# Patient Record
Sex: Male | Born: 1955 | Race: White | Hispanic: No | Marital: Married | Smoking: Never smoker
Health system: Southern US, Community
[De-identification: ages and names within clinical notes are randomized; demographics above are authoritative.]

## PROBLEM LIST (undated history)

## (undated) DIAGNOSIS — E119 Type 2 diabetes mellitus without complications: Secondary | ICD-10-CM

## (undated) DIAGNOSIS — M25561 Pain in right knee: Secondary | ICD-10-CM

## (undated) DIAGNOSIS — I639 Cerebral infarction, unspecified: Secondary | ICD-10-CM

## (undated) DIAGNOSIS — E785 Hyperlipidemia, unspecified: Secondary | ICD-10-CM

## (undated) DIAGNOSIS — M7660 Achilles tendinitis, unspecified leg: Secondary | ICD-10-CM

## (undated) DIAGNOSIS — R972 Elevated prostate specific antigen [PSA]: Secondary | ICD-10-CM

## (undated) DIAGNOSIS — M25511 Pain in right shoulder: Secondary | ICD-10-CM

## (undated) DIAGNOSIS — I10 Essential (primary) hypertension: Secondary | ICD-10-CM

## (undated) HISTORY — PX: TONSILLECTOMY: SUR1361

---

## 2011-11-22 ENCOUNTER — Telehealth: Payer: Self-pay

## 2011-11-22 NOTE — Telephone Encounter (Signed)
Spoke with pt on 11/21/2011. He said he wants to wait til a little later in the spring to have the colonoscopy and he will call when he is ready. Sending a letter to PCP.

## 2012-05-08 ENCOUNTER — Telehealth: Payer: Self-pay

## 2012-05-08 NOTE — Telephone Encounter (Signed)
LMOM to call.

## 2012-06-04 NOTE — Telephone Encounter (Signed)
Called pt. He is busy with a new job. He does want to get a colonoscopy, but will call when ready. Letter sent to PCP also.

## 2020-10-27 ENCOUNTER — Encounter: Payer: Self-pay | Admitting: Emergency Medicine

## 2020-10-27 ENCOUNTER — Ambulatory Visit
Admission: EM | Admit: 2020-10-27 | Discharge: 2020-10-27 | Disposition: A | Discharge status: Home / Self Care | Payer: Medicare Other | Attending: Family Medicine | Admitting: Family Medicine

## 2020-10-27 ENCOUNTER — Other Ambulatory Visit: Payer: Self-pay

## 2020-10-27 DIAGNOSIS — R030 Elevated blood-pressure reading, without diagnosis of hypertension: Secondary | ICD-10-CM

## 2020-10-27 DIAGNOSIS — R059 Cough, unspecified: Secondary | ICD-10-CM

## 2020-10-27 MED ORDER — BENZONATATE 100 MG PO CAPS: ORAL_CAPSULE | ORAL | 0 refills | Status: DC

## 2020-10-27 MED ORDER — AZITHROMYCIN 250 MG PO TABS: 250.0000 mg | ORAL_TABLET | Freq: Every day | ORAL | 0 refills | Status: DC

## 2020-10-27 NOTE — Discharge Instructions (Signed)
Your blood pressure was noted to be elevated during your visit today. If you are currently taking medication for high blood pressure, please ensure you are taking this as directed. If you do not have a history of high blood pressure and your blood pressure remains persistently elevated, you may need to begin taking a medication at some point. You may return here within the next few days to recheck if unable to see your primary care provider or if you do not have a one.  BP (!) 190/115 (BP Location: Right Arm)   Pulse (!) 111   Temp 98.9 F (37.2 C) (Oral)   Resp 18   Ht 6\' 4"  (1.93 m)   Wt 104.3 kg   SpO2 94%   BMI 28.00 kg/m   BP Readings from Last 3 Encounters:  10/27/20 (!) 190/115

## 2020-10-27 NOTE — ED Triage Notes (Signed)
Sinus congestion, pressure and cough that started yesterday.  Son was sick with same s/s recently.

## 2020-10-27 NOTE — ED Provider Notes (Signed)
[facility]   [id number] 10/27/20 Arrival Time: 0803  ASSESSMENT & PLAN:  1. Cough   2. Elevated blood pressure reading without diagnosis of hypertension    Stop any OTC medications with Sudafed. Likely causing increased BP. COVID testing sent.  Begin: Meds ordered this encounter  Medications  . azithromycin (ZITHROMAX) 250 MG tablet    Sig: Take 1 tablet (250 mg total) by mouth daily. Take first 2 tablets together, then 1 every day until finished.    Dispense:  6 tablet    Refill:  0  . benzonatate (TESSALON) 100 MG capsule    Sig: Take 1 capsule by mouth every 8 (eight) hours for cough.    Dispense:  21 capsule    Refill:  0     Follow-up Information    [facility] [employer] [facility].   Specialty: Family Medicine Why: If worsening or failing to improve as anticipated. Contact information: [address] [place] [address] [phone]              Recommend rechecking BP here or with PCP within the next 48-72 hours.  Reviewed expectations re: course of current medical issues. Questions answered. Outlined signs and symptoms indicating need for more acute intervention. Understanding verbalized. After Visit Summary given.   SUBJECTIVE: History from: patient. Howard Johnson is a 65 y.o. male who reports sinus congestion, pressure; thinks over past week after mowing lawn; now with significant coughing. No fever reported. No SOB or CP. Denies: sore throat and headache. Normal PO intake without n/v/d. OTC cold medicine this am; mild help.  Increased blood pressure noted today. Reports that he has not been treated for hypertension in the past. He reports no swelling of ankles, no orthostatic dizziness or lightheadedness, no orthopnea or paroxysmal nocturnal dyspnea and no palpitations.   OBJECTIVE:  Vitals:   10/27/20 0817 10/27/20 0819  BP:  (!) 190/115  Pulse:  (!) 111  Resp:  18  Temp:  98.9 F (37.2 C)  TempSrc:  Oral  SpO2:  94%   Weight: 104.3 kg   Height: 6\' 4"  (1.93 m)     General appearance: alert; no distress; appears fatigued Eyes: PERRLA; EOMI; conjunctiva normal HENT: [place]; AT; with nasal congestion Neck: supple  CV: RRR; recheck pulse 98 Lungs: speaks full sentences without difficulty; unlabored; CTAB Extremities: no edema Skin: warm and dry Neurologic: normal gait Psychological: alert and cooperative; normal mood and affect  Labs:  Labs Reviewed  NOVEL CORONAVIRUS, NAA    No Known Allergies  History reviewed. No pertinent past medical history. Social History   Socioeconomic History  . Marital status: Married    Spouse name: Not on file  . Number of children: Not on file  . Years of education: Not on file  . Highest education level: Not on file  Occupational History  . Not on file  Tobacco Use  . Smoking status: Never Smoker  . Smokeless tobacco: Never Used  Substance and Sexual Activity  . Alcohol use: Never  . Drug use: Never  . Sexual activity: Not on file  Other Topics Concern  . Not on file  Social History Narrative  . Not on file   Social Determinants of Health   Financial Resource Strain: Not on file  Food Insecurity: Not on file  Transportation Needs: Not on file  Physical Activity: Not on file  Stress: Not on file  Social Connections: Not on file  Intimate Partner Violence: Not on file  No family history on file. Past Surgical History:  Procedure Laterality Date  . Greig Right, MD 10/27/20 [phone]

## 2020-10-28 LAB — NOVEL CORONAVIRUS, NAA: SARS-CoV-2, NAA: NOT DETECTED

## 2020-10-28 LAB — SARS-COV-2, NAA 2 DAY TAT

## 2020-11-23 ENCOUNTER — Ambulatory Visit
Admission: EM | Admit: 2020-11-23 | Discharge: 2020-11-23 | Disposition: A | Discharge status: Home / Self Care | Payer: Medicare Other | Attending: Internal Medicine | Admitting: Internal Medicine

## 2020-11-23 ENCOUNTER — Encounter: Payer: Self-pay | Admitting: Emergency Medicine

## 2020-11-23 ENCOUNTER — Other Ambulatory Visit: Payer: Self-pay

## 2020-11-23 DIAGNOSIS — Z7984 Long term (current) use of oral hypoglycemic drugs: Secondary | ICD-10-CM | POA: Diagnosis not present

## 2020-11-23 DIAGNOSIS — E1165 Type 2 diabetes mellitus with hyperglycemia: Secondary | ICD-10-CM | POA: Diagnosis not present

## 2020-11-23 DIAGNOSIS — I10 Essential (primary) hypertension: Secondary | ICD-10-CM | POA: Diagnosis not present

## 2020-11-23 LAB — POCT FASTING CBG KUC MANUAL ENTRY: POCT Glucose (KUC): 314 mg/dL — AB (ref 70–99)

## 2020-11-23 MED ORDER — METFORMIN HCL 500 MG PO TABS: 500.0000 mg | ORAL_TABLET | Freq: Two times a day (BID) | ORAL | 0 refills | Status: DC

## 2020-11-23 MED ORDER — LISINOPRIL 5 MG PO TABS: 5.0000 mg | ORAL_TABLET | Freq: Every day | ORAL | 0 refills | Status: DC

## 2020-11-23 NOTE — ED Triage Notes (Signed)
States his BP and BS is high.  States blood sugar is staying around 270s to 300.

## 2020-11-23 NOTE — ED Provider Notes (Signed)
[facility]    CSN: [id number] Arrival date & time: 11/23/20  1507      History   Chief Complaint No chief complaint on file.   HPI Howard Johnson is a 65 y.o. male who presents with elevated glucose since he noticed polydipsia and decreased appetite and had been drinking a lot juices after having a cold 3 weeks. 5 days ago his glucose was 300 and has cut down on carbs and less food but his glucose are still not better. Was DM type 2 at age 70 years and started with Metforming and after changing his diet and lost wt his meds were d/c within less than one year. Had viral URI 3 weeks ago, did not do a covid test. Did not have a fever.  He tried to see his PCP today, but they wont see him since he has not been there in 3 years.    History reviewed. No pertinent past medical history.  There are no problems to display for this patient.   Past Surgical History:  Procedure Laterality Date  . TONSILLECTOMY      Home Medications    Prior to Admission medications   Medication Sig Start Date End Date Taking? Authorizing Provider  lisinopril (ZESTRIL) 5 MG tablet Take 1 tablet (5 mg total) by mouth daily. 11/23/20  Yes Rodriguez-Southworth, Nettie Elm, PA-C  metFORMIN (GLUCOPHAGE) 500 MG tablet Take 1 tablet (500 mg total) by mouth 2 (two) times daily with a meal. 11/23/20  Yes Rodriguez-Southworth, Nettie Elm, PA-C    Family History History reviewed. No pertinent family history.  Social History Social History   Tobacco Use  . Smoking status: Never Smoker  . Smokeless tobacco: Never Used  Substance Use Topics  . Alcohol use: Never  . Drug use: Never     Allergies   Patient has no known allergies.   Review of Systems Review of Systems  Review of Systems  Constitutional: Negative for diaphoresis and unexpected weight change.  HENT: Negative for tinnitus.   Eyes: Negative for visual disturbance.  Respiratory: Negative for chest tightness and shortness of breath.    Cardiovascular: Negative for chest pain, palpitations and leg swelling.  Gastrointestinal: Negative for constipation, diarrhea and nausea.  Endocrine: Negative for polydipsia, polyphagia and polyuria.  Genitourinary: Negative for dysuria and frequency.  Skin: Negative for rash and wound.  Neurological: Negative for dizziness, speech difficulty, weakness, numbness and headaches.      Physical Exam Triage Vital Signs ED Triage Vitals  Enc Vitals Group     BP 11/23/20 1515 (!) 149/104     Pulse Rate 11/23/20 1515 (!) 108     Resp 11/23/20 1515 18     Temp 11/23/20 1515 98 F (36.7 C)     Temp Source 11/23/20 1515 Oral     SpO2 11/23/20 1515 97 %     Weight --      Height --      Head Circumference --      Peak Flow --      Pain Score 11/23/20 1517 0     Pain Loc --      Pain Edu? --      Excl. in GC? --    No data found.  Updated Vital Signs BP (!) 149/104 (BP Location: Right Arm)   Pulse (!) 108   Temp 98 F (36.7 C) (Oral)   Resp 18   SpO2 97%   Visual Acuity Right Eye Distance:   Left Eye  Distance:   Bilateral Distance:    Right Eye Near:   Left Eye Near:    Bilateral Near:     Physical Exam  Constitutional: he is oriented to person, place, and time. he appears well-developed and well-nourished. No distress.  HENT:  Head: Normocephalic and atraumatic.  Right Ear: External ear normal.  Left Ear: External ear normal.  Nose: Nose normal.  Eyes: Conjunctivae are normal. Right eye exhibits no discharge. Left eye exhibits no discharge. No scleral icterus.  Neck: Neck supple. No thyromegaly present.  No carotid bruits bilaterally  Cardiovascular: Normal rate and regular rhythm.  No murmur heard. Pulmonary/Chest: Effort normal and breath sounds normal. No respiratory distress.  Musculoskeletal: Normal range of motion. he exhibits no edema.  Lymphadenopathy: he has no cervical adenopathy.  Neurological: he is alert and oriented to person, place, and time.   Skin: Skin is warm and dry. Capillary refill takes less than 2 seconds. No rash noted. he is not diaphoretic.  Psychiatric: he has a normal mood and affect. His behavior is normal. Judgment and thought content normal.  Nursing note reviewed.   UC Treatments / Results  Labs (all labs ordered are listed, but only abnormal results are displayed) Labs Reviewed  POCT FASTING CBG [facility] MANUAL ENTRY - Abnormal; Notable for the following components:      Result Value   POCT Glucose ([facility]) 314 (*)    All other components within normal limits  CBC  COMPREHENSIVE METABOLIC PANEL  HEMOGLOBIN A1C    EKG   Radiology No results found.  Procedures Procedures (including critical care time)  Medications Ordered in UC Medications - No data to display  Initial Impression / Assessment and Plan / UC Course  I have reviewed the triage vital signs and the nursing notes. Pertinent labs  results that were available during my care of the patient were reviewed by me and considered in my medical decision making (see chart for details). I ordered CBC, CMP and HGBA1C and I will call him tomorrow with results.  DM type 2 and HTN recurrent. I placed him on Metformin and Lisinopril as noted.  Final Clinical Impressions(s) / UC Diagnoses   Final diagnoses:  Uncontrolled type 2 diabetes mellitus with hyperglycemia (HCC)  Essential hypertension     Discharge Instructions     Please write down your fasting and 2 hours after eating glucose and your blood pressure readings mid morning( 2-3 hours after taking your medication) to take to your primary care doctor in 2 weeks.  I will call you tomorrow with your lab work results.     ED Prescriptions    Medication Sig Dispense Auth. Provider   metFORMIN (GLUCOPHAGE) 500 MG tablet Take 1 tablet (500 mg total) by mouth 2 (two) times daily with a meal. 60 tablet Rodriguez-Southworth, Yumna Ebers, PA-C   lisinopril (ZESTRIL) 5 MG tablet Take 1 tablet (5 mg total) by  mouth daily. 30 tablet Rodriguez-Southworth, Nettie Elm, PA-C     PDMP not reviewed this encounter.   Garey Ham, Cordelia Poche 11/23/20 1902

## 2020-11-23 NOTE — Discharge Instructions (Signed)
Please write down your fasting and 2 hours after eating glucose and your blood pressure readings mid morning( 2-3 hours after taking your medication) to take to your primary care doctor in 2 weeks.  I will call you tomorrow with your lab work results.

## 2020-11-24 ENCOUNTER — Telehealth: Payer: Self-pay | Admitting: Internal Medicine

## 2020-11-24 LAB — COMPREHENSIVE METABOLIC PANEL
ALT: 39 IU/L (ref 0–44)
AST: 25 IU/L (ref 0–40)
Albumin/Globulin Ratio: 2.2 (ref 1.2–2.2)
Albumin: 4.8 g/dL (ref 3.8–4.8)
Alkaline Phosphatase: 122 IU/L — ABNORMAL HIGH (ref 44–121)
BUN/Creatinine Ratio: 15 (ref 10–24)
BUN: 21 mg/dL (ref 8–27)
Bilirubin Total: 0.7 mg/dL (ref 0.0–1.2)
CO2: 18 mmol/L — ABNORMAL LOW (ref 20–29)
Calcium: 10.2 mg/dL (ref 8.6–10.2)
Chloride: 98 mmol/L (ref 96–106)
Creatinine, Ser: 1.4 mg/dL — ABNORMAL HIGH (ref 0.76–1.27)
Globulin, Total: 2.2 g/dL (ref 1.5–4.5)
Glucose: 343 mg/dL — ABNORMAL HIGH (ref 65–99)
Potassium: 3.8 mmol/L (ref 3.5–5.2)
Sodium: 139 mmol/L (ref 134–144)
Total Protein: 7 g/dL (ref 6.0–8.5)
eGFR: 55 mL/min/{1.73_m2} — ABNORMAL LOW (ref >59)

## 2020-11-24 LAB — CBC
Hematocrit: 50 % (ref 37.5–51.0)
Hemoglobin: 16.6 g/dL (ref 13.0–17.7)
MCH: 27.9 pg (ref 26.6–33.0)
MCHC: 33.2 g/dL (ref 31.5–35.7)
MCV: 84 fL (ref 79–97)
Platelets: 264 10*3/uL (ref 150–450)
RBC: 5.96 x10E6/uL — ABNORMAL HIGH (ref 4.14–5.80)
RDW: 13.5 % (ref 11.6–15.4)
WBC: 9 10*3/uL (ref 3.4–10.8)

## 2020-11-24 LAB — HEMOGLOBIN A1C
Est. average glucose Bld gHb Est-mCnc: 321 mg/dL
Hgb A1c MFr Bld: 12.8 % — ABNORMAL HIGH (ref 4.8–5.6)

## 2020-11-24 NOTE — Telephone Encounter (Signed)
I spoke with pt about all his lab results and he told me that his glucose this am after taking the am metformin was in the 200's and this pm was 180 post prandial. Has summitted an application to get established with a new PCP and they told him they would contact him within a week.

## 2020-12-12 DIAGNOSIS — I10 Essential (primary) hypertension: Secondary | ICD-10-CM | POA: Diagnosis not present

## 2020-12-12 DIAGNOSIS — E1165 Type 2 diabetes mellitus with hyperglycemia: Secondary | ICD-10-CM | POA: Diagnosis not present

## 2020-12-12 DIAGNOSIS — Z0189 Encounter for other specified special examinations: Secondary | ICD-10-CM | POA: Diagnosis not present

## 2020-12-12 DIAGNOSIS — N182 Chronic kidney disease, stage 2 (mild): Secondary | ICD-10-CM | POA: Diagnosis not present

## 2020-12-29 DIAGNOSIS — E1165 Type 2 diabetes mellitus with hyperglycemia: Secondary | ICD-10-CM | POA: Diagnosis not present

## 2020-12-29 DIAGNOSIS — Z0001 Encounter for general adult medical examination with abnormal findings: Secondary | ICD-10-CM | POA: Diagnosis not present

## 2021-03-13 DIAGNOSIS — E1165 Type 2 diabetes mellitus with hyperglycemia: Secondary | ICD-10-CM | POA: Diagnosis not present

## 2021-03-13 DIAGNOSIS — E785 Hyperlipidemia, unspecified: Secondary | ICD-10-CM | POA: Diagnosis not present

## 2021-03-20 DIAGNOSIS — I10 Essential (primary) hypertension: Secondary | ICD-10-CM | POA: Diagnosis not present

## 2021-03-20 DIAGNOSIS — E785 Hyperlipidemia, unspecified: Secondary | ICD-10-CM | POA: Diagnosis not present

## 2021-03-20 DIAGNOSIS — E1165 Type 2 diabetes mellitus with hyperglycemia: Secondary | ICD-10-CM | POA: Diagnosis not present

## 2021-03-20 DIAGNOSIS — N189 Chronic kidney disease, unspecified: Secondary | ICD-10-CM | POA: Diagnosis not present

## 2021-09-15 DIAGNOSIS — E1165 Type 2 diabetes mellitus with hyperglycemia: Secondary | ICD-10-CM | POA: Diagnosis not present

## 2021-09-15 DIAGNOSIS — I10 Essential (primary) hypertension: Secondary | ICD-10-CM | POA: Diagnosis not present

## 2021-09-20 DIAGNOSIS — E1165 Type 2 diabetes mellitus with hyperglycemia: Secondary | ICD-10-CM | POA: Diagnosis not present

## 2021-09-20 DIAGNOSIS — I10 Essential (primary) hypertension: Secondary | ICD-10-CM | POA: Diagnosis not present

## 2021-09-20 DIAGNOSIS — M7662 Achilles tendinitis, left leg: Secondary | ICD-10-CM | POA: Diagnosis not present

## 2021-09-20 DIAGNOSIS — R809 Proteinuria, unspecified: Secondary | ICD-10-CM | POA: Diagnosis not present

## 2021-09-20 DIAGNOSIS — N189 Chronic kidney disease, unspecified: Secondary | ICD-10-CM | POA: Diagnosis not present

## 2021-09-20 DIAGNOSIS — E785 Hyperlipidemia, unspecified: Secondary | ICD-10-CM | POA: Diagnosis not present

## 2021-12-01 ENCOUNTER — Ambulatory Visit (HOSPITAL_COMMUNITY)
Admission: RE | Admit: 2021-12-01 | Discharge: 2021-12-01 | Disposition: A | Discharge status: Home / Self Care | Payer: PPO | Source: Ambulatory Visit | Attending: Family Medicine | Admitting: Family Medicine

## 2021-12-01 ENCOUNTER — Other Ambulatory Visit (HOSPITAL_COMMUNITY): Payer: Self-pay | Admitting: Family Medicine

## 2021-12-01 DIAGNOSIS — M25561 Pain in right knee: Secondary | ICD-10-CM

## 2021-12-01 DIAGNOSIS — M25461 Effusion, right knee: Secondary | ICD-10-CM | POA: Diagnosis not present

## 2021-12-14 DIAGNOSIS — M25561 Pain in right knee: Secondary | ICD-10-CM | POA: Diagnosis not present

## 2021-12-22 DIAGNOSIS — M109 Gout, unspecified: Secondary | ICD-10-CM | POA: Diagnosis not present

## 2021-12-28 DIAGNOSIS — M25561 Pain in right knee: Secondary | ICD-10-CM | POA: Diagnosis not present

## 2021-12-28 DIAGNOSIS — M25461 Effusion, right knee: Secondary | ICD-10-CM | POA: Diagnosis not present

## 2022-03-15 DIAGNOSIS — E785 Hyperlipidemia, unspecified: Secondary | ICD-10-CM | POA: Diagnosis not present

## 2022-03-15 DIAGNOSIS — E1165 Type 2 diabetes mellitus with hyperglycemia: Secondary | ICD-10-CM | POA: Diagnosis not present

## 2022-03-22 DIAGNOSIS — E663 Overweight: Secondary | ICD-10-CM | POA: Diagnosis not present

## 2022-03-22 DIAGNOSIS — Z5320 Procedure and treatment not carried out because of patient's decision for unspecified reasons: Secondary | ICD-10-CM | POA: Diagnosis not present

## 2022-03-22 DIAGNOSIS — E785 Hyperlipidemia, unspecified: Secondary | ICD-10-CM | POA: Diagnosis not present

## 2022-03-22 DIAGNOSIS — M7662 Achilles tendinitis, left leg: Secondary | ICD-10-CM | POA: Diagnosis not present

## 2022-03-22 DIAGNOSIS — N189 Chronic kidney disease, unspecified: Secondary | ICD-10-CM | POA: Diagnosis not present

## 2022-03-22 DIAGNOSIS — I10 Essential (primary) hypertension: Secondary | ICD-10-CM | POA: Diagnosis not present

## 2022-03-22 DIAGNOSIS — E1165 Type 2 diabetes mellitus with hyperglycemia: Secondary | ICD-10-CM | POA: Diagnosis not present

## 2022-03-22 DIAGNOSIS — Z0001 Encounter for general adult medical examination with abnormal findings: Secondary | ICD-10-CM | POA: Diagnosis not present

## 2022-03-22 DIAGNOSIS — Z6827 Body mass index (BMI) 27.0-27.9, adult: Secondary | ICD-10-CM | POA: Diagnosis not present

## 2022-03-22 DIAGNOSIS — Z125 Encounter for screening for malignant neoplasm of prostate: Secondary | ICD-10-CM | POA: Diagnosis not present

## 2022-03-22 DIAGNOSIS — Z23 Encounter for immunization: Secondary | ICD-10-CM | POA: Diagnosis not present

## 2022-03-22 DIAGNOSIS — R809 Proteinuria, unspecified: Secondary | ICD-10-CM | POA: Diagnosis not present

## 2022-06-26 DIAGNOSIS — E1165 Type 2 diabetes mellitus with hyperglycemia: Secondary | ICD-10-CM | POA: Diagnosis not present

## 2022-06-26 DIAGNOSIS — I10 Essential (primary) hypertension: Secondary | ICD-10-CM | POA: Diagnosis not present

## 2022-06-26 DIAGNOSIS — Z125 Encounter for screening for malignant neoplasm of prostate: Secondary | ICD-10-CM | POA: Diagnosis not present

## 2022-07-02 DIAGNOSIS — R809 Proteinuria, unspecified: Secondary | ICD-10-CM | POA: Diagnosis not present

## 2022-07-02 DIAGNOSIS — N189 Chronic kidney disease, unspecified: Secondary | ICD-10-CM | POA: Diagnosis not present

## 2022-07-02 DIAGNOSIS — I10 Essential (primary) hypertension: Secondary | ICD-10-CM | POA: Diagnosis not present

## 2022-07-02 DIAGNOSIS — M25512 Pain in left shoulder: Secondary | ICD-10-CM | POA: Diagnosis not present

## 2022-07-02 DIAGNOSIS — Z125 Encounter for screening for malignant neoplasm of prostate: Secondary | ICD-10-CM | POA: Diagnosis not present

## 2022-07-02 DIAGNOSIS — E1165 Type 2 diabetes mellitus with hyperglycemia: Secondary | ICD-10-CM | POA: Diagnosis not present

## 2022-07-02 DIAGNOSIS — M25511 Pain in right shoulder: Secondary | ICD-10-CM | POA: Diagnosis not present

## 2022-07-02 DIAGNOSIS — Z5320 Procedure and treatment not carried out because of patient's decision for unspecified reasons: Secondary | ICD-10-CM | POA: Diagnosis not present

## 2022-07-02 DIAGNOSIS — Z0001 Encounter for general adult medical examination with abnormal findings: Secondary | ICD-10-CM | POA: Diagnosis not present

## 2022-07-02 DIAGNOSIS — M7662 Achilles tendinitis, left leg: Secondary | ICD-10-CM | POA: Diagnosis not present

## 2022-07-02 DIAGNOSIS — E785 Hyperlipidemia, unspecified: Secondary | ICD-10-CM | POA: Diagnosis not present

## 2022-07-02 DIAGNOSIS — Z23 Encounter for immunization: Secondary | ICD-10-CM | POA: Diagnosis not present

## 2022-10-05 DIAGNOSIS — E1165 Type 2 diabetes mellitus with hyperglycemia: Secondary | ICD-10-CM | POA: Diagnosis not present

## 2022-10-05 DIAGNOSIS — Z125 Encounter for screening for malignant neoplasm of prostate: Secondary | ICD-10-CM | POA: Diagnosis not present

## 2022-10-05 DIAGNOSIS — I10 Essential (primary) hypertension: Secondary | ICD-10-CM | POA: Diagnosis not present

## 2022-10-11 DIAGNOSIS — Z0001 Encounter for general adult medical examination with abnormal findings: Secondary | ICD-10-CM | POA: Diagnosis not present

## 2022-10-11 DIAGNOSIS — M7662 Achilles tendinitis, left leg: Secondary | ICD-10-CM | POA: Diagnosis not present

## 2022-10-11 DIAGNOSIS — M25511 Pain in right shoulder: Secondary | ICD-10-CM | POA: Diagnosis not present

## 2022-10-11 DIAGNOSIS — I129 Hypertensive chronic kidney disease with stage 1 through stage 4 chronic kidney disease, or unspecified chronic kidney disease: Secondary | ICD-10-CM | POA: Diagnosis not present

## 2022-10-11 DIAGNOSIS — I10 Essential (primary) hypertension: Secondary | ICD-10-CM | POA: Diagnosis not present

## 2022-10-11 DIAGNOSIS — E785 Hyperlipidemia, unspecified: Secondary | ICD-10-CM | POA: Diagnosis not present

## 2022-10-11 DIAGNOSIS — M25512 Pain in left shoulder: Secondary | ICD-10-CM | POA: Diagnosis not present

## 2022-10-11 DIAGNOSIS — R809 Proteinuria, unspecified: Secondary | ICD-10-CM | POA: Diagnosis not present

## 2022-10-11 DIAGNOSIS — R972 Elevated prostate specific antigen [PSA]: Secondary | ICD-10-CM | POA: Diagnosis not present

## 2022-10-11 DIAGNOSIS — Z7984 Long term (current) use of oral hypoglycemic drugs: Secondary | ICD-10-CM | POA: Diagnosis not present

## 2022-10-11 DIAGNOSIS — E1165 Type 2 diabetes mellitus with hyperglycemia: Secondary | ICD-10-CM | POA: Diagnosis not present

## 2022-10-11 DIAGNOSIS — N183 Chronic kidney disease, stage 3 unspecified: Secondary | ICD-10-CM | POA: Diagnosis not present

## 2023-01-10 DIAGNOSIS — E1165 Type 2 diabetes mellitus with hyperglycemia: Secondary | ICD-10-CM | POA: Diagnosis not present

## 2023-01-10 DIAGNOSIS — I10 Essential (primary) hypertension: Secondary | ICD-10-CM | POA: Diagnosis not present

## 2023-01-10 DIAGNOSIS — N183 Chronic kidney disease, stage 3 unspecified: Secondary | ICD-10-CM | POA: Diagnosis not present

## 2023-01-10 DIAGNOSIS — Z125 Encounter for screening for malignant neoplasm of prostate: Secondary | ICD-10-CM | POA: Diagnosis not present

## 2023-01-15 DIAGNOSIS — R809 Proteinuria, unspecified: Secondary | ICD-10-CM | POA: Diagnosis not present

## 2023-01-15 DIAGNOSIS — R972 Elevated prostate specific antigen [PSA]: Secondary | ICD-10-CM | POA: Diagnosis not present

## 2023-01-15 DIAGNOSIS — M7662 Achilles tendinitis, left leg: Secondary | ICD-10-CM | POA: Diagnosis not present

## 2023-01-15 DIAGNOSIS — M25511 Pain in right shoulder: Secondary | ICD-10-CM | POA: Diagnosis not present

## 2023-01-15 DIAGNOSIS — I10 Essential (primary) hypertension: Secondary | ICD-10-CM | POA: Diagnosis not present

## 2023-01-15 DIAGNOSIS — E1165 Type 2 diabetes mellitus with hyperglycemia: Secondary | ICD-10-CM | POA: Diagnosis not present

## 2023-01-15 DIAGNOSIS — N183 Chronic kidney disease, stage 3 unspecified: Secondary | ICD-10-CM | POA: Diagnosis not present

## 2023-01-15 DIAGNOSIS — M25512 Pain in left shoulder: Secondary | ICD-10-CM | POA: Diagnosis not present

## 2023-01-15 DIAGNOSIS — Z713 Dietary counseling and surveillance: Secondary | ICD-10-CM | POA: Diagnosis not present

## 2023-01-15 DIAGNOSIS — E1122 Type 2 diabetes mellitus with diabetic chronic kidney disease: Secondary | ICD-10-CM | POA: Diagnosis not present

## 2023-01-15 DIAGNOSIS — I129 Hypertensive chronic kidney disease with stage 1 through stage 4 chronic kidney disease, or unspecified chronic kidney disease: Secondary | ICD-10-CM | POA: Diagnosis not present

## 2023-01-15 DIAGNOSIS — E785 Hyperlipidemia, unspecified: Secondary | ICD-10-CM | POA: Diagnosis not present

## 2023-05-14 DIAGNOSIS — I10 Essential (primary) hypertension: Secondary | ICD-10-CM | POA: Diagnosis not present

## 2023-05-14 DIAGNOSIS — E1165 Type 2 diabetes mellitus with hyperglycemia: Secondary | ICD-10-CM | POA: Diagnosis not present

## 2023-05-14 DIAGNOSIS — Z125 Encounter for screening for malignant neoplasm of prostate: Secondary | ICD-10-CM | POA: Diagnosis not present

## 2023-05-28 DIAGNOSIS — Z713 Dietary counseling and surveillance: Secondary | ICD-10-CM | POA: Diagnosis not present

## 2023-05-28 DIAGNOSIS — R809 Proteinuria, unspecified: Secondary | ICD-10-CM | POA: Diagnosis not present

## 2023-05-28 DIAGNOSIS — M25511 Pain in right shoulder: Secondary | ICD-10-CM | POA: Diagnosis not present

## 2023-05-28 DIAGNOSIS — R972 Elevated prostate specific antigen [PSA]: Secondary | ICD-10-CM | POA: Diagnosis not present

## 2023-05-28 DIAGNOSIS — Z23 Encounter for immunization: Secondary | ICD-10-CM | POA: Diagnosis not present

## 2023-05-28 DIAGNOSIS — N183 Chronic kidney disease, stage 3 unspecified: Secondary | ICD-10-CM | POA: Diagnosis not present

## 2023-05-28 DIAGNOSIS — I10 Essential (primary) hypertension: Secondary | ICD-10-CM | POA: Diagnosis not present

## 2023-05-28 DIAGNOSIS — I129 Hypertensive chronic kidney disease with stage 1 through stage 4 chronic kidney disease, or unspecified chronic kidney disease: Secondary | ICD-10-CM | POA: Diagnosis not present

## 2023-05-28 DIAGNOSIS — E785 Hyperlipidemia, unspecified: Secondary | ICD-10-CM | POA: Diagnosis not present

## 2023-05-28 DIAGNOSIS — M25512 Pain in left shoulder: Secondary | ICD-10-CM | POA: Diagnosis not present

## 2023-05-28 DIAGNOSIS — E1165 Type 2 diabetes mellitus with hyperglycemia: Secondary | ICD-10-CM | POA: Diagnosis not present

## 2023-05-28 DIAGNOSIS — M7662 Achilles tendinitis, left leg: Secondary | ICD-10-CM | POA: Diagnosis not present

## 2023-06-11 IMAGING — DX DG KNEE 3 VIEWS*R*
3 series · 3 of 3 positions shown · non-contrast
Comparison: None.

CLINICAL DATA: Right knee pain for several weeks

EXAM:
RIGHT KNEE - 3 VIEW

[knee ap]
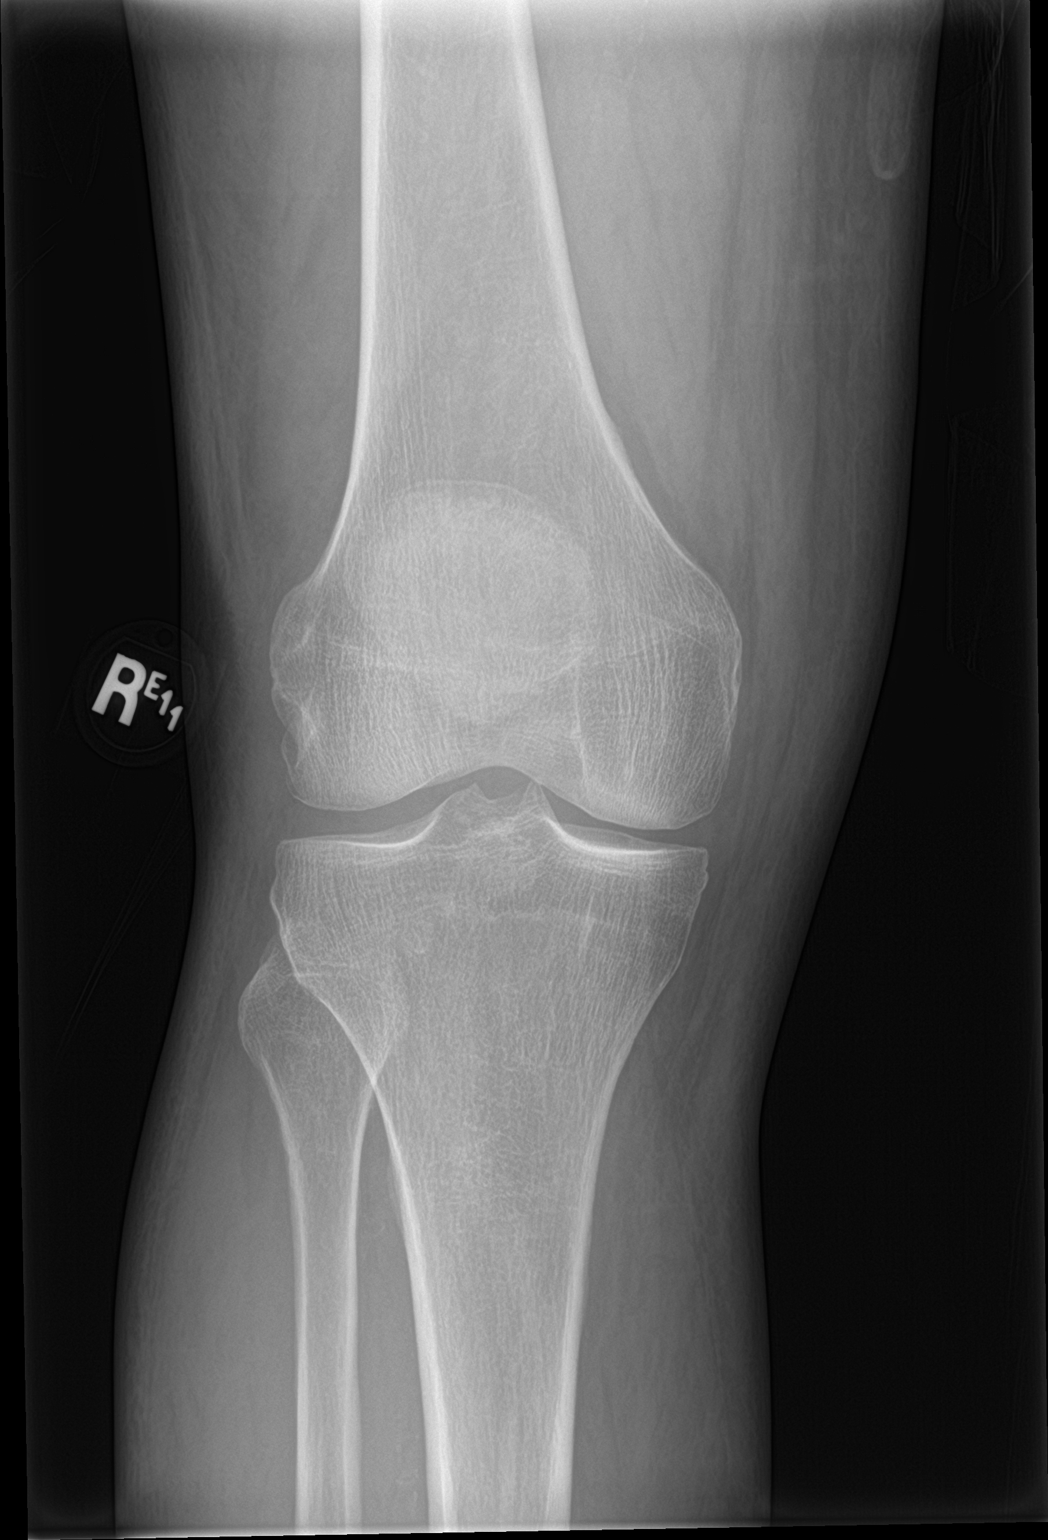

[knee lat]
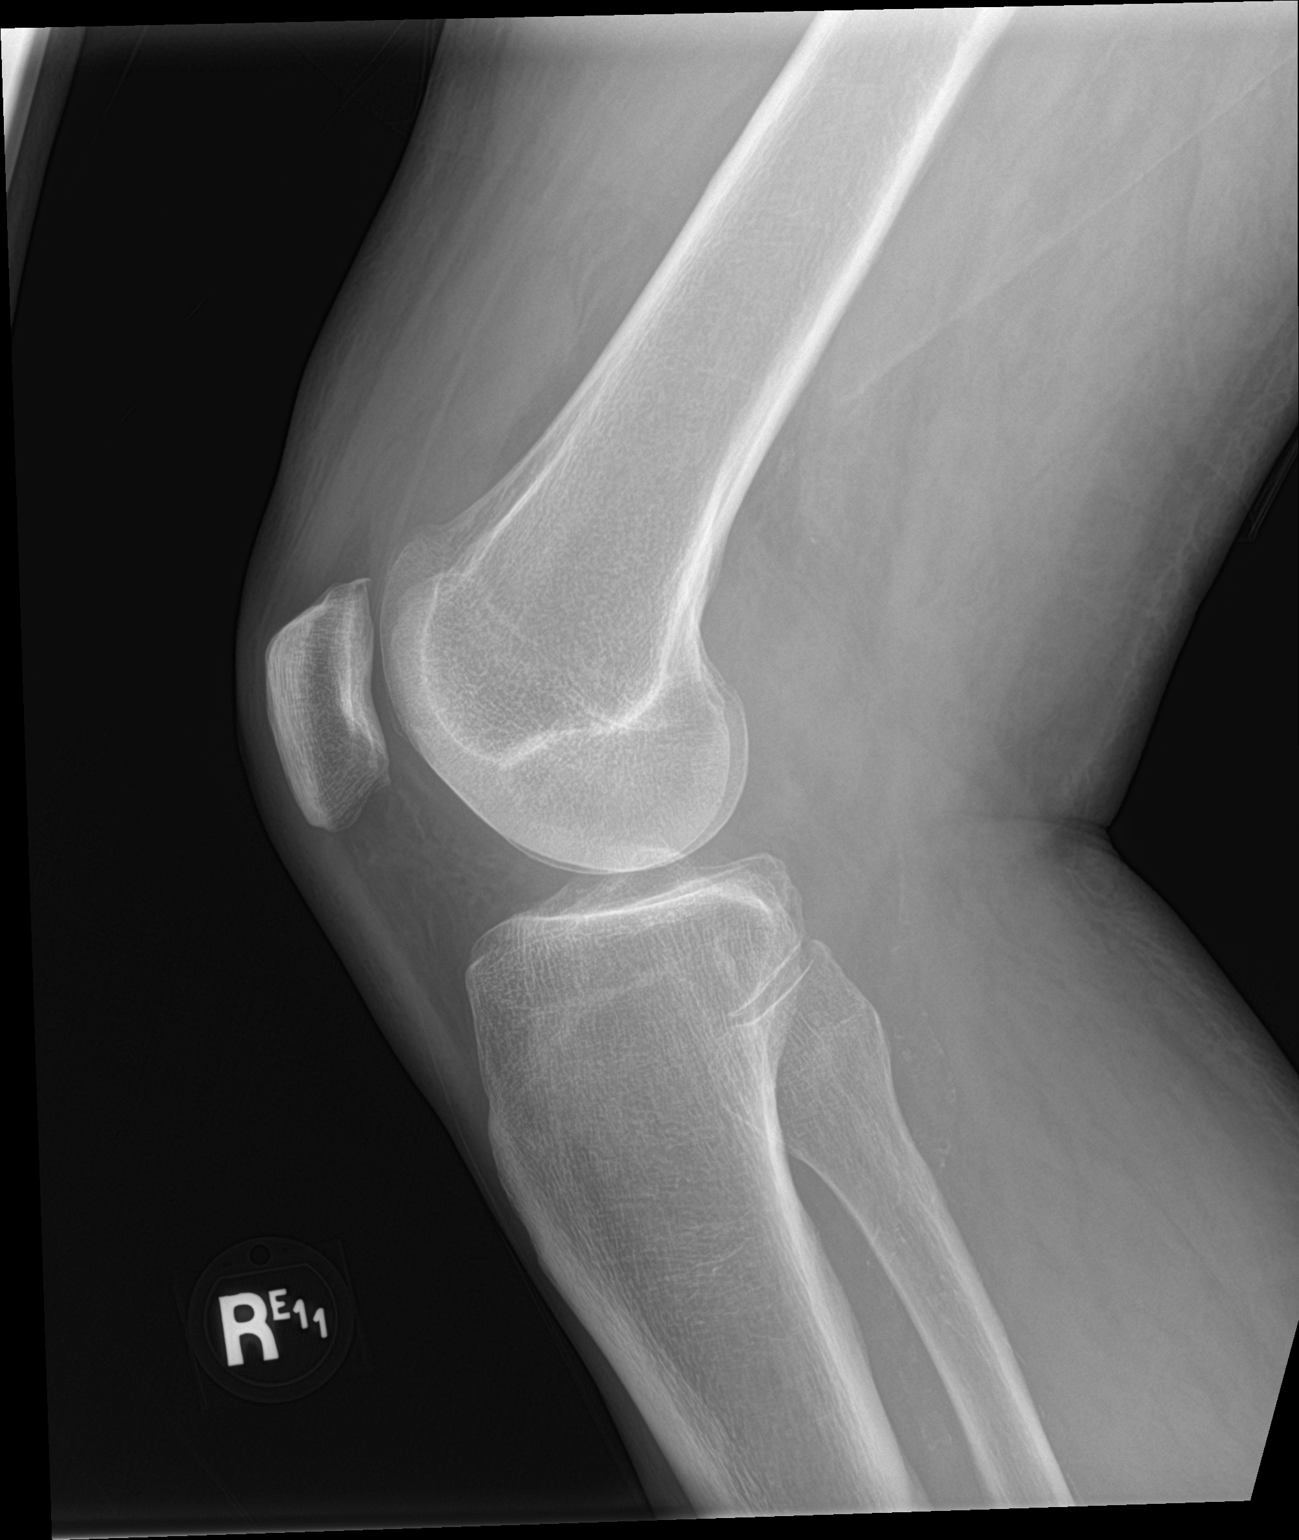

[knee sunrise]
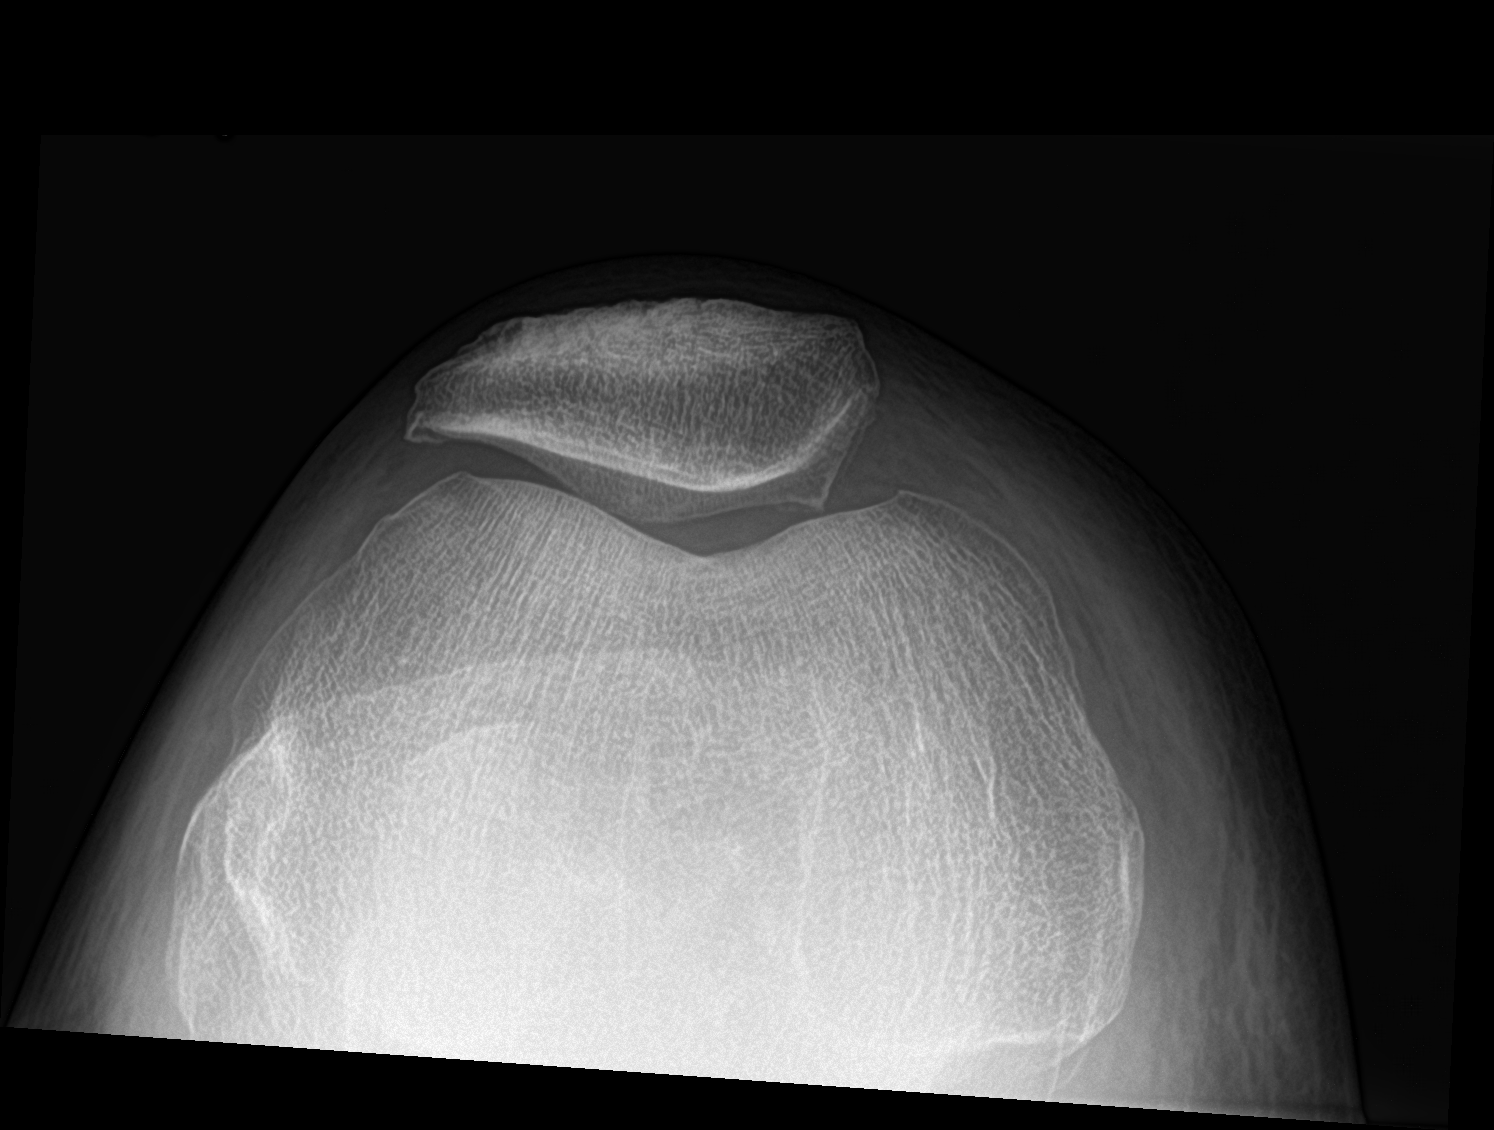

[3 of 3 positions shown; findings below may reference images not displayed]

FINDINGS: Frontal, lateral, and sunrise views of the right knee are obtained.
Mild medial compartmental joint space narrowing. Minimal spurring
along the patella. No fracture, subluxation, or dislocation.
Moderate suprapatellar effusion. Remaining soft tissues are
unremarkable.
IMPRESSION: 1. Mild medial and patellofemoral compartmental osteoarthritis.
2. Moderate right knee effusion.
3. No acute fracture.

## 2023-08-31 DIAGNOSIS — I10 Essential (primary) hypertension: Secondary | ICD-10-CM | POA: Diagnosis not present

## 2023-08-31 DIAGNOSIS — E669 Obesity, unspecified: Secondary | ICD-10-CM | POA: Diagnosis not present

## 2023-08-31 DIAGNOSIS — Z6830 Body mass index (BMI) 30.0-30.9, adult: Secondary | ICD-10-CM | POA: Diagnosis not present

## 2023-08-31 DIAGNOSIS — J069 Acute upper respiratory infection, unspecified: Secondary | ICD-10-CM | POA: Diagnosis not present

## 2023-09-09 DIAGNOSIS — J019 Acute sinusitis, unspecified: Secondary | ICD-10-CM | POA: Diagnosis not present

## 2023-09-25 DIAGNOSIS — R030 Elevated blood-pressure reading, without diagnosis of hypertension: Secondary | ICD-10-CM | POA: Diagnosis not present

## 2023-09-25 DIAGNOSIS — J019 Acute sinusitis, unspecified: Secondary | ICD-10-CM | POA: Diagnosis not present

## 2023-09-26 ENCOUNTER — Emergency Department (HOSPITAL_COMMUNITY): Payer: HMO

## 2023-09-26 ENCOUNTER — Other Ambulatory Visit: Payer: Self-pay

## 2023-09-26 ENCOUNTER — Encounter (HOSPITAL_COMMUNITY): Payer: Self-pay

## 2023-09-26 ENCOUNTER — Inpatient Hospital Stay (HOSPITAL_COMMUNITY)
Admission: EM | Admit: 2023-09-26 | Discharge: 2023-10-01 | DRG: 064 | Disposition: A | Discharge status: Rehabilitation Facility | Payer: HMO | Attending: Neurology | Admitting: Neurology

## 2023-09-26 DIAGNOSIS — R1310 Dysphagia, unspecified: Secondary | ICD-10-CM | POA: Diagnosis not present

## 2023-09-26 DIAGNOSIS — R4701 Aphasia: Secondary | ICD-10-CM | POA: Diagnosis not present

## 2023-09-26 DIAGNOSIS — I611 Nontraumatic intracerebral hemorrhage in hemisphere, cortical: Secondary | ICD-10-CM | POA: Diagnosis not present

## 2023-09-26 DIAGNOSIS — Z7901 Long term (current) use of anticoagulants: Secondary | ICD-10-CM | POA: Diagnosis not present

## 2023-09-26 DIAGNOSIS — I609 Nontraumatic subarachnoid hemorrhage, unspecified: Secondary | ICD-10-CM | POA: Diagnosis present

## 2023-09-26 DIAGNOSIS — E1169 Type 2 diabetes mellitus with other specified complication: Secondary | ICD-10-CM | POA: Diagnosis not present

## 2023-09-26 DIAGNOSIS — G936 Cerebral edema: Secondary | ICD-10-CM | POA: Diagnosis not present

## 2023-09-26 DIAGNOSIS — N183 Chronic kidney disease, stage 3 unspecified: Secondary | ICD-10-CM | POA: Diagnosis present

## 2023-09-26 DIAGNOSIS — Z91048 Other nonmedicinal substance allergy status: Secondary | ICD-10-CM | POA: Diagnosis not present

## 2023-09-26 DIAGNOSIS — I639 Cerebral infarction, unspecified: Secondary | ICD-10-CM | POA: Diagnosis not present

## 2023-09-26 DIAGNOSIS — Z1152 Encounter for screening for COVID-19: Secondary | ICD-10-CM

## 2023-09-26 DIAGNOSIS — K5900 Constipation, unspecified: Secondary | ICD-10-CM | POA: Diagnosis not present

## 2023-09-26 DIAGNOSIS — K5710 Diverticulosis of small intestine without perforation or abscess without bleeding: Secondary | ICD-10-CM | POA: Diagnosis not present

## 2023-09-26 DIAGNOSIS — E876 Hypokalemia: Secondary | ICD-10-CM | POA: Diagnosis not present

## 2023-09-26 DIAGNOSIS — Z7984 Long term (current) use of oral hypoglycemic drugs: Secondary | ICD-10-CM | POA: Diagnosis not present

## 2023-09-26 DIAGNOSIS — Z79899 Other long term (current) drug therapy: Secondary | ICD-10-CM

## 2023-09-26 DIAGNOSIS — E872 Acidosis, unspecified: Secondary | ICD-10-CM | POA: Diagnosis present

## 2023-09-26 DIAGNOSIS — I129 Hypertensive chronic kidney disease with stage 1 through stage 4 chronic kidney disease, or unspecified chronic kidney disease: Secondary | ICD-10-CM | POA: Diagnosis not present

## 2023-09-26 DIAGNOSIS — I629 Nontraumatic intracranial hemorrhage, unspecified: Secondary | ICD-10-CM | POA: Insufficient documentation

## 2023-09-26 DIAGNOSIS — R5381 Other malaise: Secondary | ICD-10-CM | POA: Diagnosis not present

## 2023-09-26 DIAGNOSIS — I6782 Cerebral ischemia: Secondary | ICD-10-CM | POA: Diagnosis not present

## 2023-09-26 DIAGNOSIS — S06320A Contusion and laceration of left cerebrum without loss of consciousness, initial encounter: Secondary | ICD-10-CM | POA: Diagnosis not present

## 2023-09-26 DIAGNOSIS — N189 Chronic kidney disease, unspecified: Secondary | ICD-10-CM | POA: Diagnosis not present

## 2023-09-26 DIAGNOSIS — Z833 Family history of diabetes mellitus: Secondary | ICD-10-CM | POA: Diagnosis not present

## 2023-09-26 DIAGNOSIS — R6510 Systemic inflammatory response syndrome (SIRS) of non-infectious origin without acute organ dysfunction: Secondary | ICD-10-CM | POA: Diagnosis not present

## 2023-09-26 DIAGNOSIS — I4891 Unspecified atrial fibrillation: Secondary | ICD-10-CM | POA: Diagnosis not present

## 2023-09-26 DIAGNOSIS — R21 Rash and other nonspecific skin eruption: Secondary | ICD-10-CM | POA: Diagnosis not present

## 2023-09-26 DIAGNOSIS — H532 Diplopia: Secondary | ICD-10-CM | POA: Diagnosis not present

## 2023-09-26 DIAGNOSIS — E785 Hyperlipidemia, unspecified: Secondary | ICD-10-CM | POA: Insufficient documentation

## 2023-09-26 DIAGNOSIS — Z7952 Long term (current) use of systemic steroids: Secondary | ICD-10-CM

## 2023-09-26 DIAGNOSIS — R4189 Other symptoms and signs involving cognitive functions and awareness: Secondary | ICD-10-CM | POA: Diagnosis present

## 2023-09-26 DIAGNOSIS — R29706 NIHSS score 6: Secondary | ICD-10-CM | POA: Diagnosis not present

## 2023-09-26 DIAGNOSIS — I2510 Atherosclerotic heart disease of native coronary artery without angina pectoris: Secondary | ICD-10-CM | POA: Diagnosis not present

## 2023-09-26 DIAGNOSIS — I6389 Other cerebral infarction: Secondary | ICD-10-CM | POA: Diagnosis not present

## 2023-09-26 DIAGNOSIS — I10 Essential (primary) hypertension: Secondary | ICD-10-CM | POA: Diagnosis not present

## 2023-09-26 DIAGNOSIS — E119 Type 2 diabetes mellitus without complications: Secondary | ICD-10-CM

## 2023-09-26 DIAGNOSIS — I69218 Other symptoms and signs involving cognitive functions following other nontraumatic intracranial hemorrhage: Secondary | ICD-10-CM | POA: Diagnosis not present

## 2023-09-26 DIAGNOSIS — Z7722 Contact with and (suspected) exposure to environmental tobacco smoke (acute) (chronic): Secondary | ICD-10-CM | POA: Diagnosis not present

## 2023-09-26 DIAGNOSIS — E1165 Type 2 diabetes mellitus with hyperglycemia: Secondary | ICD-10-CM | POA: Diagnosis present

## 2023-09-26 DIAGNOSIS — G08 Intracranial and intraspinal phlebitis and thrombophlebitis: Secondary | ICD-10-CM | POA: Diagnosis not present

## 2023-09-26 DIAGNOSIS — E1122 Type 2 diabetes mellitus with diabetic chronic kidney disease: Secondary | ICD-10-CM | POA: Diagnosis not present

## 2023-09-26 DIAGNOSIS — R000 Tachycardia, unspecified: Secondary | ICD-10-CM | POA: Diagnosis not present

## 2023-09-26 DIAGNOSIS — I619 Nontraumatic intracerebral hemorrhage, unspecified: Secondary | ICD-10-CM | POA: Insufficient documentation

## 2023-09-26 DIAGNOSIS — R29818 Other symptoms and signs involving the nervous system: Secondary | ICD-10-CM | POA: Diagnosis not present

## 2023-09-26 DIAGNOSIS — I69290 Apraxia following other nontraumatic intracranial hemorrhage: Secondary | ICD-10-CM | POA: Diagnosis not present

## 2023-09-26 DIAGNOSIS — N2889 Other specified disorders of kidney and ureter: Secondary | ICD-10-CM | POA: Diagnosis not present

## 2023-09-26 DIAGNOSIS — N281 Cyst of kidney, acquired: Secondary | ICD-10-CM | POA: Diagnosis not present

## 2023-09-26 DIAGNOSIS — Z794 Long term (current) use of insulin: Secondary | ICD-10-CM | POA: Diagnosis not present

## 2023-09-26 DIAGNOSIS — R4182 Altered mental status, unspecified: Secondary | ICD-10-CM | POA: Diagnosis not present

## 2023-09-26 DIAGNOSIS — D72829 Elevated white blood cell count, unspecified: Secondary | ICD-10-CM | POA: Diagnosis not present

## 2023-09-26 DIAGNOSIS — N1831 Chronic kidney disease, stage 3a: Secondary | ICD-10-CM | POA: Diagnosis not present

## 2023-09-26 DIAGNOSIS — I69220 Aphasia following other nontraumatic intracranial hemorrhage: Secondary | ICD-10-CM | POA: Diagnosis not present

## 2023-09-26 DIAGNOSIS — R519 Headache, unspecified: Secondary | ICD-10-CM | POA: Diagnosis not present

## 2023-09-26 HISTORY — DX: Type 2 diabetes mellitus without complications: E11.9

## 2023-09-26 HISTORY — DX: Achilles tendinitis, unspecified leg: M76.60

## 2023-09-26 HISTORY — DX: Essential (primary) hypertension: I10

## 2023-09-26 HISTORY — DX: Pain in right knee: M25.561

## 2023-09-26 HISTORY — DX: Pain in right shoulder: M25.511

## 2023-09-26 HISTORY — DX: Hyperlipidemia, unspecified: E78.5

## 2023-09-26 HISTORY — DX: Elevated prostate specific antigen (PSA): R97.20

## 2023-09-26 LAB — RESP PANEL BY RT-PCR (RSV, FLU A&B, COVID)  RVPGX2
Influenza A by PCR: NEGATIVE
Influenza B by PCR: NEGATIVE
Resp Syncytial Virus by PCR: NEGATIVE
SARS Coronavirus 2 by RT PCR: NEGATIVE

## 2023-09-26 LAB — URINALYSIS, ROUTINE W REFLEX MICROSCOPIC
Bacteria, UA: NONE SEEN
Bilirubin Urine: NEGATIVE
Glucose, UA: >=500 mg/dL — AB
Ketones, ur: 20 mg/dL — AB
Leukocytes,Ua: NEGATIVE
Nitrite: NEGATIVE
Protein, ur: NEGATIVE mg/dL
Specific Gravity, Urine: 1.03 (ref 1.005–1.030)
pH: 5 (ref 5.0–8.0)

## 2023-09-26 LAB — LACTIC ACID, PLASMA
Lactic Acid, Venous: 2.9 mmol/L (ref 0.5–1.9)
Lactic Acid, Venous: 1.7 mmol/L (ref 0.5–1.9)

## 2023-09-26 LAB — COMPREHENSIVE METABOLIC PANEL
ALT: 26 U/L (ref 0–44)
AST: 18 U/L (ref 15–41)
Albumin: 3.9 g/dL (ref 3.5–5.0)
Alkaline Phosphatase: 101 U/L (ref 38–126)
Anion gap: 17 — ABNORMAL HIGH (ref 5–15)
BUN: 22 mg/dL (ref 8–23)
CO2: 20 mmol/L — ABNORMAL LOW (ref 22–32)
Calcium: 9.6 mg/dL (ref 8.9–10.3)
Chloride: 95 mmol/L — ABNORMAL LOW (ref 98–111)
Creatinine, Ser: 1.68 mg/dL — ABNORMAL HIGH (ref 0.61–1.24)
GFR, Estimated: 44 mL/min — ABNORMAL LOW (ref >60)
Glucose, Bld: 454 mg/dL — ABNORMAL HIGH (ref 70–99)
Potassium: 3.6 mmol/L (ref 3.5–5.1)
Sodium: 132 mmol/L — ABNORMAL LOW (ref 135–145)
Total Bilirubin: 1.2 mg/dL (ref 0.0–1.2)
Total Protein: 7.5 g/dL (ref 6.5–8.1)

## 2023-09-26 LAB — CBG MONITORING, ED
Glucose-Capillary: 415 mg/dL — ABNORMAL HIGH (ref 70–99)
Glucose-Capillary: 246 mg/dL — ABNORMAL HIGH (ref 70–99)

## 2023-09-26 LAB — CBC WITH DIFFERENTIAL/PLATELET
Abs Immature Granulocytes: 0.08 10*3/uL — ABNORMAL HIGH (ref 0.00–0.07)
Basophils Absolute: 0.1 10*3/uL (ref 0.0–0.1)
Basophils Relative: 0 %
Eosinophils Absolute: 0 10*3/uL (ref 0.0–0.5)
Eosinophils Relative: 0 %
HCT: 52.7 % — ABNORMAL HIGH (ref 39.0–52.0)
Hemoglobin: 17.9 g/dL — ABNORMAL HIGH (ref 13.0–17.0)
Immature Granulocytes: 1 %
Lymphocytes Relative: 3 %
Lymphs Abs: 0.4 10*3/uL — ABNORMAL LOW (ref 0.7–4.0)
MCH: 27.7 pg (ref 26.0–34.0)
MCHC: 34 g/dL (ref 30.0–36.0)
MCV: 81.5 fL (ref 80.0–100.0)
Monocytes Absolute: 0.8 10*3/uL (ref 0.1–1.0)
Monocytes Relative: 5 %
Neutro Abs: 14.5 10*3/uL — ABNORMAL HIGH (ref 1.7–7.7)
Neutrophils Relative %: 91 %
Platelets: 286 10*3/uL (ref 150–400)
RBC: 6.47 MIL/uL — ABNORMAL HIGH (ref 4.22–5.81)
RDW: 14.6 % (ref 11.5–15.5)
WBC: 15.9 10*3/uL — ABNORMAL HIGH (ref 4.0–10.5)
nRBC: 0 % (ref 0.0–0.2)

## 2023-09-26 LAB — RAPID URINE DRUG SCREEN, HOSP PERFORMED
Amphetamines: NOT DETECTED
Barbiturates: NOT DETECTED
Benzodiazepines: NOT DETECTED
Cocaine: NOT DETECTED
Opiates: NOT DETECTED
Tetrahydrocannabinol: NOT DETECTED

## 2023-09-26 LAB — TSH: TSH: 0.673 u[IU]/mL (ref 0.350–4.500)

## 2023-09-26 LAB — HIV ANTIBODY (ROUTINE TESTING W REFLEX): HIV Screen 4th Generation wRfx: NONREACTIVE

## 2023-09-26 LAB — GLUCOSE, CAPILLARY: Glucose-Capillary: 197 mg/dL — ABNORMAL HIGH (ref 70–99)

## 2023-09-26 LAB — AMMONIA: Ammonia: 10 umol/L (ref 9–35)

## 2023-09-26 LAB — MRSA NEXT GEN BY PCR, NASAL: MRSA by PCR Next Gen: NOT DETECTED

## 2023-09-26 MED ORDER — LACTATED RINGERS IV SOLN: INTRAVENOUS | Status: AC

## 2023-09-26 MED ORDER — INSULIN ASPART 100 UNIT/ML IJ SOLN
0.0000 [IU] | Freq: Every day | INTRAMUSCULAR | Status: DC
  Administered 2023-09-27: 4 [IU] via SUBCUTANEOUS
  Administered 2023-09-28: 2 [IU] via SUBCUTANEOUS

## 2023-09-26 MED ORDER — SENNOSIDES-DOCUSATE SODIUM 8.6-50 MG PO TABS
1.0000 | ORAL_TABLET | Freq: Two times a day (BID) | ORAL | Status: DC
  Administered 2023-09-26 – 2023-09-30 (×9): 1 via ORAL
  Filled 2023-09-26 (×10): qty 1

## 2023-09-26 MED ORDER — CLEVIDIPINE BUTYRATE 0.5 MG/ML IV EMUL
0.0000 mg/h | INTRAVENOUS | Status: DC
  Administered 2023-09-26: 2 mg/h via INTRAVENOUS
  Filled 2023-09-26: qty 50

## 2023-09-26 MED ORDER — IOHEXOL 350 MG/ML SOLN
60.0000 mL | Freq: Once | INTRAVENOUS | Status: AC | PRN
  Administered 2023-09-26: 60 mL via INTRAVENOUS

## 2023-09-26 MED ORDER — ACETAMINOPHEN 325 MG PO TABS
650.0000 mg | ORAL_TABLET | ORAL | Status: DC | PRN
  Administered 2023-09-26 – 2023-09-30 (×8): 650 mg via ORAL
  Filled 2023-09-26 (×8): qty 2

## 2023-09-26 MED ORDER — HEPARIN (PORCINE) 25000 UT/250ML-% IV SOLN
1350.0000 [IU]/h | INTRAVENOUS | Status: DC
  Administered 2023-09-26: 1200 [IU]/h via INTRAVENOUS
  Filled 2023-09-26 (×2): qty 250

## 2023-09-26 MED ORDER — INSULIN ASPART 100 UNIT/ML IJ SOLN
0.0000 [IU] | Freq: Three times a day (TID) | INTRAMUSCULAR | Status: DC
Start: 2023-09-26 — End: 2023-10-01
  Administered 2023-09-26 – 2023-09-27 (×2): 3 [IU] via SUBCUTANEOUS
  Administered 2023-09-27 (×2): 5 [IU] via SUBCUTANEOUS
  Administered 2023-09-28: 2 [IU] via SUBCUTANEOUS
  Administered 2023-09-28: 3 [IU] via SUBCUTANEOUS
  Administered 2023-09-28: 5 [IU] via SUBCUTANEOUS
  Administered 2023-09-29: 2 [IU] via SUBCUTANEOUS
  Administered 2023-09-29 – 2023-09-30 (×4): 5 [IU] via SUBCUTANEOUS
  Administered 2023-09-30: 3 [IU] via SUBCUTANEOUS
  Administered 2023-10-01 (×2): 2 [IU] via SUBCUTANEOUS
  Filled 2023-09-26: qty 1

## 2023-09-26 MED ORDER — GADOBUTROL 1 MMOL/ML IV SOLN
10.0000 mL | Freq: Once | INTRAVENOUS | Status: AC | PRN
  Administered 2023-09-26: 10 mL via INTRAVENOUS

## 2023-09-26 MED ORDER — ACETAMINOPHEN 650 MG RE SUPP: 650.0000 mg | RECTAL | Status: DC | PRN

## 2023-09-26 MED ORDER — STROKE: EARLY STAGES OF RECOVERY BOOK
Freq: Once | Status: AC
  Filled 2023-09-26: qty 1

## 2023-09-26 MED ORDER — IOHEXOL 300 MG/ML  SOLN
80.0000 mL | Freq: Once | INTRAMUSCULAR | Status: AC | PRN
  Administered 2023-09-26: 80 mL via INTRAVENOUS

## 2023-09-26 MED ORDER — ONDANSETRON HCL 4 MG/2ML IJ SOLN
4.0000 mg | INTRAMUSCULAR | Status: DC | PRN
Start: 2023-09-26 — End: 2023-10-01
  Administered 2023-09-26: 4 mg via INTRAVENOUS
  Filled 2023-09-26: qty 2

## 2023-09-26 MED ORDER — VANCOMYCIN HCL 1750 MG/350ML IV SOLN
1750.0000 mg | INTRAVENOUS | Status: DC
  Filled 2023-09-26: qty 350

## 2023-09-26 MED ORDER — AMOXICILLIN-POT CLAVULANATE 500-125 MG PO TABS
500.0000 mg | ORAL_TABLET | Freq: Three times a day (TID) | ORAL | Status: AC
  Administered 2023-09-26 – 2023-09-29 (×11): 1 via ORAL
  Filled 2023-09-26 (×11): qty 1

## 2023-09-26 MED ORDER — VANCOMYCIN HCL 2000 MG/400ML IV SOLN
2000.0000 mg | Freq: Once | INTRAVENOUS | Status: AC
  Administered 2023-09-26: 2000 mg via INTRAVENOUS
  Filled 2023-09-26: qty 400

## 2023-09-26 MED ORDER — ACETAMINOPHEN 160 MG/5ML PO SOLN: 650.0000 mg | ORAL | Status: DC | PRN

## 2023-09-26 MED ORDER — CHLORHEXIDINE GLUCONATE CLOTH 2 % EX PADS
6.0000 | MEDICATED_PAD | Freq: Every day | CUTANEOUS | Status: DC
  Administered 2023-09-26 – 2023-10-01 (×4): 6 via TOPICAL

## 2023-09-26 MED ORDER — SODIUM CHLORIDE 0.9 % IV SOLN
2.0000 g | Freq: Once | INTRAVENOUS | Status: AC
  Administered 2023-09-26: 2 g via INTRAVENOUS
  Filled 2023-09-26: qty 12.5

## 2023-09-26 MED ORDER — LACTATED RINGERS IV BOLUS
1000.0000 mL | Freq: Once | INTRAVENOUS | Status: AC
  Administered 2023-09-26: 1000 mL via INTRAVENOUS

## 2023-09-26 MED ORDER — STROKE: EARLY STAGES OF RECOVERY BOOK
Freq: Once | Status: AC
  Filled 2023-09-26 (×2): qty 1

## 2023-09-26 MED ORDER — PANTOPRAZOLE SODIUM 40 MG IV SOLR
40.0000 mg | Freq: Every day | INTRAVENOUS | Status: DC
  Administered 2023-09-26 – 2023-09-27 (×2): 40 mg via INTRAVENOUS
  Filled 2023-09-26 (×2): qty 10

## 2023-09-26 NOTE — ED Notes (Signed)
Pts son would like to be notified of any updates, [phone].

## 2023-09-26 NOTE — ED Notes (Signed)
Patient transported to CT

## 2023-09-26 NOTE — H&P (Signed)
History and Physical    Patient: Howard Johnson ZOX:[id number] DOB: 04/10/1956 DOA: 09/26/2023 DOS: the patient was seen and examined on 09/26/2023 PCP: Benita Stabile, MD  Patient coming from: Home  Chief Complaint:  Chief Complaint  Patient presents with   Altered Mental Status   HPI: Howard Johnson is a 68 y.o. male with a history of T2DM, HTN, HLD and recent diagnosis of sinusitis who presented from home with confusion. His son reports a few days of agitation  and abnormal speech. He had been seen in urgent care recently with cough and facial pain in the setting of flu exposure. He tested negative, but was started on augmentin. His history is limited to his son and EDP records as patient has word salad when attempting to speak.   In the ED he was tachycardic, afebrile, hypertensive. Labs included leukocytosis (15.9k), lactic acidosis (LA 2.9 > 1.7) with bicarb 20, renal impairment (SCr 1.68 from unclear baseline) and severe hyperglycemia (glu 454). Flu, RSV, influenza neg. UA negative, UDS negative. CXR showed bilateral interstitial thickening suspected to be chronic with no airspace disease. CT C/A/P was nonacute. Head CT revealed a small foci of acute hemorrhage in the left temporal lobe. Brain MRI confirmed SAH without contrast-enhancing lesions, and revealed findings worrisome for dural venous sinus thrombosis.   EDP discussed with neurology who recommended CTA head and CT venogram, holding anticoagulation, and admission to [facility]. Blood cultures were drawn and broad spectrum IV antibiotics were administered.  Review of Systems: unable to review all systems due to the inability of the patient to answer questions. Past Medical History:  Diagnosis Date   Diabetes mellitus without complication (HCC)    Hyperlipidemia    Hypertension    Past Surgical History:  Procedure Laterality Date   TONSILLECTOMY     Social History:  reports that he has never smoked. He has never used smokeless tobacco.  He reports that he does not drink alcohol and does not use drugs.  Allergies  Allergen Reactions   Tape Rash    History reviewed. No pertinent family history.  Prior to Admission medications   Medication Sig Start Date End Date Taking? Authorizing Provider  amLODipine (NORVASC) 5 MG tablet Take 5 mg by mouth daily. 04/02/23  Yes [provider]  amoxicillin-clavulanate (AUGMENTIN) 500-125 MG tablet Take 500 mg by mouth 3 (three) times daily. 09/25/23  Yes [provider]  FARXIGA 10 MG TABS tablet Take 10 mg by mouth daily. 06/17/23  Yes [provider]  guaifenesin (ROBITUSSIN) 100 MG/5ML syrup Take 200 mg by mouth 3 (three) times daily as needed for cough.   Yes [provider]  lisinopril (ZESTRIL) 20 MG tablet Take 20 mg by mouth 2 (two) times daily. 09/03/23  Yes [provider]  rosuvastatin (CRESTOR) 10 MG tablet Take 10 mg by mouth daily. 05/30/23  Yes [provider]  RYBELSUS 7 MG TABS Take 7 mg by mouth daily. 08/09/23  Yes [provider]  predniSONE (DELTASONE) 10 MG tablet Take 10 mg by mouth daily with breakfast.    [provider]    Physical Exam: Vitals:   09/26/23 1200 09/26/23 1440 09/26/23 1520 09/26/23 1730  BP: 128/76 (!) 162/97  (!) 129/98  Pulse: (!) 104  98 99  Resp: 18   16  Temp:      TempSrc:      SpO2: 94%  91% 93%  Weight:      Height:  Gen: Older nontoxic male sitting relaxed in chair appearing frustrated Pulm: Clear, nonlabored  CV: RRR, HR in 90's, no MRG or edema GI: Soft, NT, ND, +BS  Neuro: Alert and interactive, speech is grossly abnormal, incongruent and stuttering without dysarthria. MAEW, follows most commands. No new focal deficits. Ext: Warm, no deformities Skin: No rashes, lesions or ulcers on visualized skin   Data Reviewed: Labs included leukocytosis (15.9k), lactic acidosis (LA 2.9 > 1.7) with bicarb 20, renal impairment (SCr 1.68 from unclear baseline) and  severe hyperglycemia (glu 454). Flu, RSV, influenza neg. UA negative, UDS negative. CXR showed bilateral interstitial thickening suspected to be chronic with no airspace disease. CT C/A/P was nonacute. Head CT revealed a small foci of acute hemorrhage in the left temporal lobe. Brain MRI confirmed SAH without contrast-enhancing lesions, and revealed findings worrisome for dural venous sinus thrombosis.   Assessment and Plan: Dural venous sinus thrombosis, SAH:  - Neurology consulted. CTA head and CT venogram pending, told to hold anticoagulation at this time pending [facility].  - Neuro checks q4h, ordered and d/w RN.  SIRS: Unclear source at this time, could be reactive to dural venous sinus thrombosis/SAH. He has no meningismus and workup otherwise reassuring against focal nidus of infection.  - Will monitor off IV antibiotics for now (given cefepime and vancomycin), trend leukocytosis.  - Continue PTA augmentin for possible sinusitis.   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  - IVF continue x10 hours.  - Hold ACEi for now - Recheck BMP in AM.  T2DM with hyperglycemia: Last HbA1c 12.8% in 2022, suspect a chronic hyperglycemic state.  - Started sensitive SSI, will change as indicated - Hold rybelsus, farxiga for now - Carb modified diet.  - Update HbA1c.    HLD:  - Update lipid panel - Restart statin with dose based on LDL.   HTN:  - Hold ACEi and norvasc for now   Advance Care Planning: Full code assumed  Consults: Neurology, Dr. Selina Cooley  Family Communication: None at bedside, couldn't reach son by phone  Severity of Illness: The appropriate patient status for this patient is INPATIENT. Inpatient status is judged to be reasonable and necessary in order to provide the required intensity of service to ensure the patient's safety. The patient's presenting symptoms, physical exam findings, and initial radiographic and  laboratory data in the context of their chronic comorbidities is felt to place them at high risk for further clinical deterioration. Furthermore, it is not anticipated that the patient will be medically stable for discharge from the hospital within 2 midnights of admission.   * I certify that at the point of admission it is my clinical judgment that the patient will require inpatient hospital care spanning beyond 2 midnights from the point of admission due to high intensity of service, high risk for further deterioration and high frequency of surveillance required.*  Author: Tyrone Nine, MD 09/26/2023 6:34 PM  For on call review [email or web address].

## 2023-09-26 NOTE — ED Notes (Signed)
Patient transported via [facility] to [facility] at this time.  All personal belonging taken with patient

## 2023-09-26 NOTE — Consult Note (Signed)
NEUROLOGY CONSULT NOTE   Date of service: September 26, 2023 Patient Name: Howard Johnson MRN:  [id number] DOB:  1956-04-12 Chief Complaint: "Aphasia" Requesting Provider: Rejeana Johnson, *  History of Present Illness  Howard Johnson is a 68 y.o. male with hx of hypertension, hyperlipidemia who presented to the emergency department several days of abnormal speech.  It was thought that he was likely confused to be cleared but his entire family has had recently.  He was found to have a leukocytosis and lactic acidosis and hyperglycemia.  Head CT, however, revealed of small area of acute hemorrhage and MRI revealed findings concerning for venous sinus thrombosis which was confirmed with CT venogram.  On the CT venogram, it was noted that his temporal lobe hematoma had expanded and therefore he was transferred to the intensive care unit at [facility] for further management.   LKW: Several days ago IV Thrombolysis: No, ICH ICH Score: Zero  NIHSS components Score: Comment  1a Level of Conscious 0 x 1 2 3      1b LOC Questions 0  1 2 x      1c LOC Commands 0 1 x 2      2 Best Gaze 0 x 1 2      3  Visual 0 x 1 2 3     4  Facial Palsy 0 x 1 2 3      5a Motor Arm - left 0 x 1 2 3 4  UN   5b Motor Arm - Right 0 x 1 2 3 4  UN   6a Motor Leg - Left 0 x 1 2 3 4  UN   6b Motor Leg - Right 0 x 1 2 3 4  UN   7 Limb Ataxia 0 x 1 2 3  UN    8 Sensory 0 x 1 2 UN     9 Best Language 0 1  2 x 3     10 Dysarthria 0  1 x 2 UN     11 Extinct. and Inattention 0 x 1 2      TOTAL:  6       Past History   Past Medical History:  Diagnosis Date   Diabetes mellitus without complication (HCC)    Hyperlipidemia    Hypertension     Past Surgical History:  Procedure Laterality Date   TONSILLECTOMY      Family History: History reviewed. No pertinent family history.  Social History  reports that he has never smoked. He has never used smokeless tobacco. He reports that he does not drink alcohol and does not use  drugs.  Allergies  Allergen Reactions   Tape Rash    Medications   Current Facility-Administered Medications:    [START ON 09/27/2023]  stroke: early stages of recovery book, , Does not apply, Once, Howard Nine, MD   [START ON 09/27/2023]  stroke: early stages of recovery book, , Does not apply, Once, Howard Brock, MD   acetaminophen (TYLENOL) tablet 650 mg, 650 mg, Oral, Q4H PRN **OR** acetaminophen (TYLENOL) 160 MG/5ML solution 650 mg, 650 mg, Per Tube, Q4H PRN **OR** acetaminophen (TYLENOL) suppository 650 mg, 650 mg, Rectal, Q4H PRN, Howard Nine, MD   amoxicillin-clavulanate (AUGMENTIN) 500-125 MG per tablet 1 tablet, 500 mg, Oral, TID, Howard Nine, MD, 1 tablet at 09/26/23 2239   Chlorhexidine Gluconate Cloth 2 % PADS 6 each, 6 each, Topical, Daily, Howard Brock, MD   clevidipine (CLEVIPREX) infusion 0.5 mg/mL, 0-21 mg/hr, Intravenous,  Continuous, Howard Brock, MD   heparin ADULT infusion 100 units/mL (25000 units/220mL), 1,200 Units/hr, Intravenous, Continuous, Howard [facility], Howard [facility], Last Rate: 12 mL/hr at September 27, 2023 2203, 1,200 Units/hr at 2023/09/27 2203   insulin aspart (novoLOG) injection 0-5 Units, 0-5 Units, Subcutaneous, QHS, [facility], Howard B, MD   insulin aspart (novoLOG) injection 0-9 Units, 0-9 Units, Subcutaneous, TID WC, Howard Nine, MD, 3 Units at Sep 27, 2023 1751   lactated ringers infusion, , Intravenous, Continuous, Howard Nine, MD   pantoprazole (PROTONIX) injection 40 mg, 40 mg, Intravenous, QHS, Howard [facility], Howard Negus, MD   senna-docusate (Senokot-S) tablet 1 tablet, 1 tablet, Oral, BID, Howard Brock, MD, 1 tablet at 09/27/23 2239   [START ON 09/27/2023] vancomycin (VANCOREADY) IVPB 1750 mg/350 mL, 1,750 mg, Intravenous, Q24H, Howard Nine, MD  Vitals   Vitals:   09/27/2023 1947 Sep 27, 2023 2100 09/27/2023 2130 09/27/2023 2200  BP:  (!) 153/104 (!) 153/77 (!) 167/102  Pulse:  (!) 106 (!) 102 99  Resp:  12 15 15   Temp: 99.1 F (37.3  C) 98.6 F (37 C)    TempSrc: Oral Oral    SpO2:  98% 99% 99%  Weight:  92.2 kg    Height:  6\' 4"  (1.93 m)      Body mass index is 24.74 kg/m.  Physical Exam   Constitutional: Appears well-developed and well-nourished.  Neurologic Examination    Neuro: Mental Status: Patient is awake, alert, he has a fairly severe aphasia, but with repeated questioning, he is able to tell me some answers but is unable to tell me the month or his age.  He has significant difficulty following commands, but does close eyes to command, though I cannot get him to do a fist. Cranial Nerves: II: He is able to count fingers in all four visual fields. Pupils are equal, round, and reactive to light.   III,IV, VI: EOMI without ptosis or diploplia.  V: Facial sensation is symmetric to temperature VII: Facial movement is symmetric.  VIII: hearing is intact to voice X: Uvula elevates symmetrically XII: tongue is midline without atrophy or fasciculations.  Motor: Tone is normal. Bulk is normal. 5/5 strength was present in all four extremities.  Sensory: Sensation is symmetric to light touch and temperature in the arms and legs. Cerebellar: No clear ataxia, though he does not       Labs/Imaging/Neurodiagnostic studies   CBC:  Recent Labs  Lab September 27, 2023 1030  WBC 15.9*  NEUTROABS 14.5*  HGB 17.9*  HCT 52.7*  MCV 81.5  PLT 286   Basic Metabolic Panel:  Lab Results  Component Value Date   NA 132 (L) 09-27-23   K 3.6 09-27-2023   CO2 20 (L) 27-Sep-2023   GLUCOSE 454 (H) September 27, 2023   BUN 22 Sep 27, 2023   CREATININE 1.68 (H) 27-Sep-2023   CALCIUM 9.6 2023-09-27   GFRNONAA 44 (L) Sep 27, 2023   Lipid Panel: No results found for: "LDLCALC" HgbA1c:  Lab Results  Component Value Date   HGBA1C 12.8 (H) 11/23/2020   Urine Drug Screen:     Component Value Date/Time   LABOPIA NONE DETECTED September 27, 2023 1148   COCAINSCRNUR NONE DETECTED 2023/09/27 1148   LABBENZ NONE DETECTED Sep 27, 2023 1148    AMPHETMU NONE DETECTED September 27, 2023 1148   THCU NONE DETECTED 27-Sep-2023 1148   LABBARB NONE DETECTED 2023/09/27 1148    Alcohol Level No results found for: "ETH" INR No results found for: "INR" APTT No results found for: "APTT" AED levels: No results found for: "  PHENYTOIN", "ZONISAMIDE", "LAMOTRIGINE", "LEVETIRACETA"   MRI Brain(Personally reviewed): Temporal lobe hematoma  ASSESSMENT   Howard [facility] is a 68 y.o. 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  Venous sinus thrombosis requires anticoagulation, even in the setting of intracranial hemorrhage anticoagulation continues to be indicated.  I had a conversation with the son who was present with his mother regarding what his wishes would be if he were to worsen, and they indicated that if he were to worsen to the point of needing a breathing tube, chest compressions, or emergency salvage surgery, that he would not want to undergo this type set her up measures.  They would want support up to that point, however.  RECOMMENDATIONS  Admit to the neuro ICU, stroke service attending Start heparin, stroke protocol Frequent neurochecks Repeat head CT in the morning  Continue SSI for DM Fluids for elevated creatinine Continue Augmentin for possible sinusitis+  Stroke team to follow ______________________________________________________________________    Signed, Ritta Slot, MD [facility]

## 2023-09-26 NOTE — ED Provider Notes (Signed)
[place] EMERGENCY DEPARTMENT AT [facility] Provider Note  CSN: [id number] Arrival date & time: 09/26/23 1002  Chief Complaint(s) Altered Mental Status  HPI Howard [facility] is a 68 y.o. male with PMH T2DM, HTN, HLD who presents emergency room for evaluation of altered mental status.  History obtained from patient's son who states that 3 days ago he started to notice some agitation which is atypical for his father.  Symptoms have started to worsen over the last few days and that patient had complained of cough and facial pain.  There have been some sick contacts at home with the flu and he went to urgent care yesterday where he reportedly tested negative for flu and was started on Augmentin.  His speech significant worsened today and he came to the emergency department for further evaluation.  Here in the ER, patient has intermittent garbled speech and arrives tachycardic.  Denies abdominal pain, nausea, vomiting, fever or other systemic symptoms.   Past Medical History Past Medical History:  Diagnosis Date   Diabetes mellitus without complication (HCC)    Hyperlipidemia    Hypertension    There are no active problems to display for this patient.  Home Medication(s) Prior to Admission medications   Medication Sig Start Date End Date Taking? Authorizing Provider  amLODipine (NORVASC) 5 MG tablet Take 5 mg by mouth daily. 04/02/23  Yes [provider]  FARXIGA 10 MG TABS tablet Take 10 mg by mouth daily. 06/17/23  Yes [provider]  rosuvastatin (CRESTOR) 10 MG tablet Take 10 mg by mouth daily. 05/30/23  Yes [provider]  RYBELSUS 7 MG TABS 7 mg. 08/09/23  Yes [provider]  lisinopril (ZESTRIL) 5 MG tablet Take 1 tablet (5 mg total) by mouth daily. 11/23/20   Rodriguez-Southworth, Nettie Elm, PA-C  metFORMIN (GLUCOPHAGE) 500 MG tablet Take 1 tablet (500 mg total) by mouth 2 (two) times daily with a meal. 11/23/20   Rodriguez-Southworth, Viviana Simpler                                                                                                                                    Past Surgical History Past Surgical History:  Procedure Laterality Date   TONSILLECTOMY     Family History History reviewed. No pertinent family history.  Social History Social History   Tobacco Use   Smoking status: Never   Smokeless tobacco: Never  Vaping Use   Vaping status: Never Used  Substance Use Topics   Alcohol use: Never   Drug use: Never   Allergies Patient has no known allergies.  Review of Systems Review of Systems  Psychiatric/Behavioral:  Positive for confusion.     Physical Exam Vital Signs  I have reviewed the triage vital signs BP (!) 151/96   Pulse (!) 119   Temp 98.3 F (36.8 C) (Oral)   Resp 17   Ht 6\' 4"  (1.93 m)  Wt 104 kg   SpO2 99%   BMI 27.91 kg/m   Physical Exam Constitutional:      General: He is not in acute distress.    Appearance: Normal appearance.  HENT:     Head: Normocephalic and atraumatic.     Nose: No congestion or rhinorrhea.  Eyes:     General:        Right eye: No discharge.        Left eye: No discharge.     Extraocular Movements: Extraocular movements intact.     Pupils: Pupils are equal, round, and reactive to light.  Cardiovascular:     Rate and Rhythm: Normal rate and regular rhythm.     Heart sounds: No murmur heard. Pulmonary:     Effort: No respiratory distress.     Breath sounds: No wheezing or rales.  Abdominal:     General: There is no distension.     Tenderness: There is no abdominal tenderness.  Musculoskeletal:        General: Normal range of motion.     Cervical back: Normal range of motion.  Skin:    General: Skin is warm and dry.  Neurological:     General: No focal deficit present.     Mental Status: He is alert. He is disoriented.     Cranial Nerves: No cranial nerve deficit.     Sensory: No sensory deficit.     Motor: No weakness.     ED  Results and Treatments Labs (all labs ordered are listed, but only abnormal results are displayed) Labs Reviewed  COMPREHENSIVE METABOLIC PANEL - Abnormal; Notable for the following components:      Result Value   Sodium 132 (*)    Chloride 95 (*)    CO2 20 (*)    Glucose, Bld 454 (*)    Creatinine, Ser 1.68 (*)    GFR, Estimated 44 (*)    Anion gap 17 (*)    All other components within normal limits  CBC WITH DIFFERENTIAL/PLATELET - Abnormal; Notable for the following components:   WBC 15.9 (*)    RBC 6.47 (*)    Hemoglobin 17.9 (*)    HCT 52.7 (*)    Neutro Abs 14.5 (*)    Lymphs Abs 0.4 (*)    Abs Immature Granulocytes 0.08 (*)    All other components within normal limits  CBG MONITORING, ED - Abnormal; Notable for the following components:   Glucose-Capillary 415 (*)    All other components within normal limits  RESP PANEL BY RT-PCR (RSV, FLU A&B, COVID)  RVPGX2  CULTURE, BLOOD (ROUTINE X 2)  CULTURE, BLOOD (ROUTINE X 2)  AMMONIA  URINALYSIS, ROUTINE W REFLEX MICROSCOPIC  RAPID URINE DRUG SCREEN, HOSP PERFORMED  TSH  LACTIC ACID, PLASMA  LACTIC ACID, PLASMA  Radiology No results found.  Pertinent labs & imaging results that were available during my care of the patient were reviewed by me and considered in my medical decision making (see MDM for details).  Medications Ordered in ED Medications - No data to display                                                                                                                                   Procedures .Critical Care  Performed by: Glendora Score, MD Authorized by: Glendora Score, MD   Critical care provider statement:    Critical care time (minutes):  30   Critical care was necessary to treat or prevent imminent or life-threatening deterioration of the following conditions:  Sepsis and CNS  failure or compromise   Critical care was time spent personally by me on the following activities:  Development of treatment plan with patient or surrogate, discussions with consultants, evaluation of patient's response to treatment, examination of patient, ordering and review of laboratory studies, ordering and review of radiographic studies, ordering and performing treatments and interventions, pulse oximetry, re-evaluation of patient's condition and review of old charts   (including critical care time)  Medical Decision Making / ED Course   This patient presents to the ED for concern of altered mental status, speech difficulty, this involves an extensive number of treatment options, and is a complaint that carries with it a high risk of complications and morbidity.  The differential diagnosis includes CVA, hemorrhagic stroke, mass, Todd's paralysis, seizure, electrolyte abnormality, encephalopathy, complicated migraine, dural venous sinus thrombosis, bacteremia, sepsis, unspecified viral URI  MDM: Patient seen emerged department for evaluation of altered mental status and garbled speech.  Physical exam with a rapid tachycardia, inappropriate and garbled speech on exam but no focal motor deficits.  No deficits on extraocular movement exam.  No cranial nerve deficits.  Laboratory evaluation with a leukocytosis to 15.9, hemoglobin 17.9, sodium 132, glucose 454, creatinine 1.68.  Initial lactic 2.9.  Patient meets SIRS criteria and fluid resuscitation as well as broad-spectrum antibiotics initiated.  Patient taken immediately to the CAT scanner on arrival to rule out intracranial bleed and it does show small foci of acute hemorrhage in the left temporal lobe.  Follow-up brain MRI concerning for dural venous sinus thrombosis as well as a subarachnoid hemorrhage in the left temporal lobe.  CT chest abdomen pelvis without evidence of septic source.  I spoke with the neurologist on-call Dr. Selina Cooley who is  recommending CT venogram as well as CT angio head with and without prior to heparin initiation.  Also recommending admission at [facility].  Patient admitted to the hospitalist.   Additional history obtained: -Additional history obtained from son -External records from outside source obtained and reviewed including: Chart review including previous notes, labs, imaging, consultation notes   Lab Tests: -I ordered, reviewed, and interpreted labs.   The pertinent results include:   Labs Reviewed  COMPREHENSIVE METABOLIC PANEL - Abnormal; Notable for the following components:      Result Value   Sodium 132 (*)    Chloride 95 (*)    CO2 20 (*)    Glucose, Bld 454 (*)    Creatinine, Ser 1.68 (*)    GFR, Estimated 44 (*)    Anion gap 17 (*)    All other components within normal limits  CBC WITH DIFFERENTIAL/PLATELET - Abnormal; Notable for the following components:   WBC 15.9 (*)    RBC 6.47 (*)    Hemoglobin 17.9 (*)    HCT 52.7 (*)    Neutro Abs 14.5 (*)    Lymphs Abs 0.4 (*)    Abs Immature Granulocytes 0.08 (*)    All other components within normal limits  CBG MONITORING, ED - Abnormal; Notable for the following components:   Glucose-Capillary 415 (*)    All other components within normal limits  RESP PANEL BY RT-PCR (RSV, FLU A&B, COVID)  RVPGX2  CULTURE, BLOOD (ROUTINE X 2)  CULTURE, BLOOD (ROUTINE X 2)  AMMONIA  URINALYSIS, ROUTINE W REFLEX MICROSCOPIC  RAPID URINE DRUG SCREEN, HOSP PERFORMED  TSH  LACTIC ACID, PLASMA  LACTIC ACID, PLASMA      EKG   EKG Interpretation Date/Time:  Thursday September 26 2023 10:25:08 EST Ventricular Rate:  118 PR Interval:  176 QRS Duration:  92 QT Interval:  274 QTC Calculation: 384 R Axis:   6  Text Interpretation: Sinus tachycardia Nonspecific T abnormalities, lateral leads Confirmed by Isidore Margraf ([id number]) on 09/26/2023 5:48:46 PM         Imaging Studies ordered: I ordered imaging studies including CT head, MRI brain, chest  x-ray, CT chest abdomen pelvis I independently visualized and interpreted imaging. I agree with the radiologist interpretation   Medicines ordered and prescription drug management: No orders of the defined types were placed in this encounter.   -I have reviewed the patients home medicines and have made adjustments as needed  Critical interventions Fluids, broad-spectrum antibiotics, neurologic consultation  Consultations Obtained: I requested consultation with the neurologist on-call Dr. Selina Cooley,  and discussed lab and imaging findings as well as pertinent plan - they recommend: CTA, CTV, Uriah admission   Cardiac Monitoring: The patient was maintained on a cardiac monitor.  I personally viewed and interpreted the cardiac monitored which showed an underlying rhythm of: Sinus tachycardia  Social Determinants of Health:  Factors impacting patients care include: none   Reevaluation: After the interventions noted above, I reevaluated the patient and found that they have :improved  Co morbidities that complicate the patient evaluation  Past Medical History:  Diagnosis Date   Diabetes mellitus without complication (HCC)    Hyperlipidemia    Hypertension       Dispostion: I considered admission for this patient, and patient will require hospital admission for dural venous sinus thrombosis and altered mental status     Final Clinical Impression(s) / ED Diagnoses Final diagnoses:  None     @PCDICTATION @    Glendora Score, MD 09/26/23 1749

## 2023-09-26 NOTE — Progress Notes (Signed)
Pharmacy Antibiotic Note  Howard Johnson is a 68 y.o. male admitted on 09/26/2023 with  altered mental status .  Pharmacy has been consulted for vancomycin dosing.  Patient currently afebrile, wbc elevated at 15.9. Scr elevated at 1.68 up from previous results.   Vancomycin 1750 mg IV Q 24 hrs. Goal AUC 400-550. Expected AUC: 500 SCr used: 1.68   Plan: Vancomycin 2g now then 1750 IV every 24 hours.  Goal trough 15-20 mcg/mL.  Height: 6\' 4"  (193 cm) Weight: 104 kg (229 lb 4.5 oz) IBW/kg (Calculated) : 86.8  Temp (24hrs), Avg:98.3 F (36.8 C), Min:98.3 F (36.8 C), Max:98.3 F (36.8 C)  Recent Labs  Lab 09/26/23 1030 09/26/23 1047  WBC 15.9*  --   CREATININE 1.68*  --   LATICACIDVEN  --  2.9*    Estimated Creatinine Clearance: 50.9 mL/min (A) (by C-G formula based on SCr of 1.68 mg/dL (H)).    No Known Allergies   Thank you for allowing pharmacy to be a part of this patient's care.  Sheppard Coil PharmD., BCPS Clinical Pharmacist 09/26/2023 11:39 AM

## 2023-09-26 NOTE — ED Notes (Signed)
Pt ambulated to the restroom.

## 2023-09-26 NOTE — ED Notes (Signed)
Pt given meal tray.

## 2023-09-26 NOTE — ED Notes (Signed)
Patient transported to MRI

## 2023-09-26 NOTE — ED Triage Notes (Signed)
Pt arrived via POV c/o altered mental status. Pt seen yesterday at urgent care and started on Augmentin yesterday. Pt family present in Triage and report Pt has been having difficulty forming sentences, pt not answering questions appropriately. Pt presents with neologisms in Triage. Family also report recent exposure to Flu at home.

## 2023-09-26 NOTE — Consult Note (Incomplete)
NEUROLOGY CONSULT NOTE   Date of service: September 26, 2023 Patient Name: Howard Johnson MRN:  [id number] DOB:  [birth year] Chief Complaint: "" Requesting Provider: Rejeana Brock, *  History of Present Illness  Lerry Cordrey is a 68 y.o. male with hx of ***  LKW: *** Modified rankin score: {Modified Rankin Scale:21264} IV Thrombolysis: ***Yes, *** No (reason) EVT: ***Yes, *** No (reason) ICH Score:***  NIHSS components Score: Comment  1a Level of Conscious 0[]  1[]  2[]  3[]      1b LOC Questions 0[]  1[]  2[]       1c LOC Commands 0[]  1[]  2[]       2 Best Gaze 0[]  1[]  2[]       3 Visual 0[]  1[]  2[]  3[]      4 Facial Palsy 0[]  1[]  2[]  3[]      5a Motor Arm - left 0[]  1[]  2[]  3[]  4[]  UN[]    5b Motor Arm - Right 0[]  1[]  2[]  3[]  4[]  UN[]    6a Motor Leg - Left 0[]  1[]  2[]  3[]  4[]  UN[]    6b Motor Leg - Right 0[]  1[]  2[]  3[]  4[]  UN[]    7 Limb Ataxia 0[]  1[]  2[]  3[]  UN[]     8 Sensory 0[]  1[]  2[]  UN[]      9 Best Language 0[]  1[]  2[]  3[]      10 Dysarthria 0[]  1[]  2[]  UN[]      11 Extinct. and Inattention 0[]  1[]  2[]       TOTAL:       ROS  ***Comprehensive ROS performed and pertinent positives documented in HPI  ***Unable to ascertain due to ***  Past History   Past Medical History:  Diagnosis Date  . Diabetes mellitus without complication (HCC)   . Hyperlipidemia   . Hypertension     Past Surgical History:  Procedure Laterality Date  . TONSILLECTOMY      Family History: History reviewed. No pertinent family history.  Social History  reports that he has never smoked. He has never used smokeless tobacco. He reports that he does not drink alcohol and does not use drugs.  Allergies  Allergen Reactions  . Tape Rash    Medications   Current Facility-Administered Medications:  .  [START ON 09/27/2023]  stroke: early stages of recovery book, , Does not apply, Once, Tyrone Nine, MD .  Melene Muller ON 09/27/2023]  stroke: early stages of recovery book, , Does not apply, Once,  Rejeana Brock, MD .  acetaminophen (TYLENOL) tablet 650 mg, 650 mg, Oral, Q4H PRN **OR** acetaminophen (TYLENOL) 160 MG/5ML solution 650 mg, 650 mg, Per Tube, Q4H PRN **OR** acetaminophen (TYLENOL) suppository 650 mg, 650 mg, Rectal, Q4H PRN, Tyrone Nine, MD .  amoxicillin-clavulanate (AUGMENTIN) 500-125 MG per tablet 1 tablet, 500 mg, Oral, TID, Tyrone Nine, MD, 1 tablet at 09/26/23 2239 .  Chlorhexidine Gluconate Cloth 2 % PADS 6 each, 6 each, Topical, Daily, Rejeana Brock, MD .  clevidipine (CLEVIPREX) infusion 0.5 mg/mL, 0-21 mg/hr, Intravenous, Continuous, Nanie Dunkleberger, Hardin Negus, MD .  heparin ADULT infusion 100 units/mL (25000 units/257mL), 1,200 Units/hr, Intravenous, Continuous, Daylene Posey, [facility], Last Rate: 12 mL/hr at 09/26/23 2203, 1,200 Units/hr at 09/26/23 2203 .  insulin aspart (novoLOG) injection 0-5 Units, 0-5 Units, Subcutaneous, QHS, Grunz, Ryan B, MD .  insulin aspart (novoLOG) injection 0-9 Units, 0-9 Units, Subcutaneous, TID WC, Tyrone Nine, MD, 3 Units at 09/26/23 1751 .  lactated ringers infusion, , Intravenous, Continuous, Hazeline Junker B, MD .  pantoprazole (PROTONIX) injection 40 mg, 40 mg,  Intravenous, QHS, Rejeana Brock, MD .  senna-docusate (Senokot-S) tablet 1 tablet, 1 tablet, Oral, BID, Rejeana Brock, MD, 1 tablet at 2023-09-27 2239 .  [START ON 09/27/2023] vancomycin (VANCOREADY) IVPB 1750 mg/350 mL, 1,750 mg, Intravenous, Q24H, Tyrone Nine, MD  Vitals   Vitals:   09/27/2023 1947 Sep 27, 2023 2100 September 27, 2023 2130 27-Sep-2023 2200  BP:  (!) 153/104 (!) 153/77 (!) 167/102  Pulse:  (!) 106 (!) 102 99  Resp:  12 15 15   Temp: 99.1 F (37.3 C) 98.6 F (37 C)    TempSrc: Oral Oral    SpO2:  98% 99% 99%  Weight:  92.2 kg    Height:  6\' 4"  (1.93 m)      Body mass index is 24.74 kg/m.  Physical Exam   Constitutional: Appears well-developed and well-nourished. *** Psych: Affect appropriate to situation. *** Eyes: No scleral  injection. *** HENT: No OP obstruction. *** Head: Normocephalic. *** Cardiovascular: Normal rate and regular rhythm. *** Respiratory: Effort normal, non-labored breathing. *** GI: Soft.  No distension. There is no tenderness. *** Skin: WDI. ***  Neurologic Examination   ***  Labs/Imaging/Neurodiagnostic studies   CBC:  Recent Labs  Lab 2023-09-27 1030  WBC 15.9*  NEUTROABS 14.5*  HGB 17.9*  HCT 52.7*  MCV 81.5  PLT 286   Basic Metabolic Panel:  Lab Results  Component Value Date   NA 132 (L) 09-27-23   K 3.6 Sep 27, 2023   CO2 20 (L) Sep 27, 2023   GLUCOSE 454 (H) Sep 27, 2023   BUN 22 2023/09/27   CREATININE 1.68 (H) 09/27/2023   CALCIUM 9.6 27-Sep-2023   GFRNONAA 44 (L) 09/27/23   Lipid Panel: No results found for: "LDLCALC" HgbA1c:  Lab Results  Component Value Date   HGBA1C 12.8 (H) 11/23/2020   Urine Drug Screen:     Component Value Date/Time   LABOPIA NONE DETECTED 09/27/23 1148   COCAINSCRNUR NONE DETECTED 2023-09-27 1148   LABBENZ NONE DETECTED 2023-09-27 1148   AMPHETMU NONE DETECTED 27-Sep-2023 1148   THCU NONE DETECTED 09-27-23 1148   LABBARB NONE DETECTED 27-Sep-2023 1148    Alcohol Level No results found for: "ETH" INR No results found for: "INR" APTT No results found for: "APTT" AED levels: No results found for: "PHENYTOIN", "ZONISAMIDE", "LAMOTRIGINE", "LEVETIRACETA"  CT Head without contrast(Personally reviewed): ***  CT angio Head and Neck with contrast(Personally reviewed): ***  MR Angio head without contrast and Carotid Duplex BL(Personally reviewed): ***  MRI Brain(Personally reviewed): ***  Neurodiagnostics rEEG:  ***  ASSESSMENT   Amear Strojny is a 68 y.o. male ***  RECOMMENDATIONS  *** ______________________________________________________________________    Stormy Card, MD [facility]

## 2023-09-26 NOTE — Progress Notes (Signed)
PHARMACY - ANTICOAGULATION CONSULT NOTE  Pharmacy Consult for heparin Indication: venous sinus thrombosis  Allergies  Allergen Reactions   Tape Rash    Patient Measurements: Height: 6\' 4"  (193 cm) Weight: 104 kg (229 lb 4.5 oz) IBW/kg (Calculated) : 86.8 Heparin Dosing Weight: TBW  Vital Signs: Temp: 98.6 F (37 C) (02/13 2100) Temp Source: Oral (02/13 2100) BP: 151/93 (02/13 1947) Pulse Rate: 109 (02/13 1947)  Labs: Recent Labs    09/26/23 1030  HGB 17.9*  HCT 52.7*  PLT 286  CREATININE 1.68*    Estimated Creatinine Clearance: 50.9 mL/min (A) (by C-G formula based on SCr of 1.68 mg/dL (H)).   Medical History: Past Medical History:  Diagnosis Date   Diabetes mellitus without complication (HCC)    Hyperlipidemia    Hypertension     Assessment: 64 YOM presenting with AMS, CT venogram with dural venous sinus thrombosis (occlusive)/SAH, pharmacy consulted for conservative heparin start, will target low end goals and no bolus  Goal of Therapy:  Heparin level 0.3-0.5 units/ml Monitor platelets by anticoagulation protocol: Yes   Plan:  Heparin gtt at 1200 units/hr, no bolus F/u 6 hour heparin level F/u neuro recs and AC plan  Daylene Posey, PharmD, [facility] Clinical Pharmacist ED Pharmacist Phone # [phone] 09/26/2023 9:40 PM

## 2023-09-27 ENCOUNTER — Telehealth (HOSPITAL_COMMUNITY): Payer: Self-pay | Admitting: Pharmacy Technician

## 2023-09-27 ENCOUNTER — Other Ambulatory Visit (HOSPITAL_COMMUNITY): Payer: Self-pay

## 2023-09-27 ENCOUNTER — Inpatient Hospital Stay (HOSPITAL_COMMUNITY): Payer: HMO

## 2023-09-27 DIAGNOSIS — I6389 Other cerebral infarction: Secondary | ICD-10-CM

## 2023-09-27 DIAGNOSIS — G08 Intracranial and intraspinal phlebitis and thrombophlebitis: Secondary | ICD-10-CM | POA: Diagnosis not present

## 2023-09-27 LAB — BASIC METABOLIC PANEL
Anion gap: 14 (ref 5–15)
Anion gap: 13 (ref 5–15)
BUN: 27 mg/dL — ABNORMAL HIGH (ref 8–23)
BUN: 29 mg/dL — ABNORMAL HIGH (ref 8–23)
CO2: 16 mmol/L — ABNORMAL LOW (ref 22–32)
CO2: 20 mmol/L — ABNORMAL LOW (ref 22–32)
Calcium: 9.2 mg/dL (ref 8.9–10.3)
Calcium: 9 mg/dL (ref 8.9–10.3)
Chloride: 104 mmol/L (ref 98–111)
Chloride: 101 mmol/L (ref 98–111)
Creatinine, Ser: 1.7 mg/dL — ABNORMAL HIGH (ref 0.61–1.24)
Creatinine, Ser: 1.77 mg/dL — ABNORMAL HIGH (ref 0.61–1.24)
GFR, Estimated: 43 mL/min — ABNORMAL LOW (ref >60)
GFR, Estimated: 41 mL/min — ABNORMAL LOW (ref >60)
Glucose, Bld: 249 mg/dL — ABNORMAL HIGH (ref 70–99)
Glucose, Bld: 325 mg/dL — ABNORMAL HIGH (ref 70–99)
Potassium: 3.7 mmol/L (ref 3.5–5.1)
Potassium: 3.6 mmol/L (ref 3.5–5.1)
Sodium: 134 mmol/L — ABNORMAL LOW (ref 135–145)
Sodium: 134 mmol/L — ABNORMAL LOW (ref 135–145)

## 2023-09-27 LAB — CBC
HCT: 50.1 % (ref 39.0–52.0)
Hemoglobin: 17.1 g/dL — ABNORMAL HIGH (ref 13.0–17.0)
MCH: 27.4 pg (ref 26.0–34.0)
MCHC: 34.1 g/dL (ref 30.0–36.0)
MCV: 80.4 fL (ref 80.0–100.0)
Platelets: 270 10*3/uL (ref 150–400)
RBC: 6.23 MIL/uL — ABNORMAL HIGH (ref 4.22–5.81)
RDW: 14.4 % (ref 11.5–15.5)
WBC: 16.6 10*3/uL — ABNORMAL HIGH (ref 4.0–10.5)
nRBC: 0 % (ref 0.0–0.2)

## 2023-09-27 LAB — ECHOCARDIOGRAM COMPLETE
Height: 76 in
S' Lateral: 2.5 cm
Weight: 3252.23 [oz_av]

## 2023-09-27 LAB — GLUCOSE, CAPILLARY
Glucose-Capillary: 258 mg/dL — ABNORMAL HIGH (ref 70–99)
Glucose-Capillary: 235 mg/dL — ABNORMAL HIGH (ref 70–99)
Glucose-Capillary: 287 mg/dL — ABNORMAL HIGH (ref 70–99)
Glucose-Capillary: 318 mg/dL — ABNORMAL HIGH (ref 70–99)

## 2023-09-27 LAB — LIPID PANEL
Cholesterol: 112 mg/dL (ref 0–200)
HDL: 36 mg/dL — ABNORMAL LOW (ref >40)
LDL Cholesterol: 54 mg/dL (ref 0–99)
Total CHOL/HDL Ratio: 3.1 {ratio}
Triglycerides: 110 mg/dL (ref <150)
VLDL: 22 mg/dL (ref 0–40)

## 2023-09-27 LAB — HEPARIN LEVEL (UNFRACTIONATED)
Heparin Unfractionated: <0.1 [IU]/mL — ABNORMAL LOW (ref 0.30–0.70)
Heparin Unfractionated: 0.29 [IU]/mL — ABNORMAL LOW (ref 0.30–0.70)
Heparin Unfractionated: 0.75 [IU]/mL — ABNORMAL HIGH (ref 0.30–0.70)

## 2023-09-27 LAB — MAGNESIUM: Magnesium: 2.1 mg/dL (ref 1.7–2.4)

## 2023-09-27 LAB — PHOSPHORUS: Phosphorus: 3.3 mg/dL (ref 2.5–4.6)

## 2023-09-27 MED ORDER — ORAL CARE MOUTH RINSE
15.0000 mL | OROMUCOSAL | Status: DC | PRN
  Administered 2023-09-27: 15 mL via OROMUCOSAL

## 2023-09-27 MED ORDER — QUETIAPINE FUMARATE 25 MG PO TABS
25.0000 mg | ORAL_TABLET | Freq: Three times a day (TID) | ORAL | Status: DC | PRN
  Administered 2023-09-27: 25 mg via ORAL
  Filled 2023-09-27: qty 1

## 2023-09-27 MED ORDER — ORAL CARE MOUTH RINSE
15.0000 mL | OROMUCOSAL | Status: DC
  Administered 2023-09-28 – 2023-10-01 (×11): 15 mL via OROMUCOSAL

## 2023-09-27 MED ORDER — SODIUM CHLORIDE 0.9 % IV SOLN: INTRAVENOUS | Status: AC

## 2023-09-27 NOTE — Progress Notes (Addendum)
 STROKE TEAM PROGRESS NOTE   INTERIM HISTORY/SUBJECTIVE  Patient has remained hemodynamically stable with a Tmax of 100.2 overnight.  He has been anticoagulated with heparin for CVST and will need CT chest abdomen and pelvis to rule out occult malignancy. Neurological exam suggest significant expressive greater than receptive aphasia and dysarthria with no focal extremity weakness.  Blood pressure adequately controlled.  Patient has been started on IV heparin drip. OBJECTIVE  CBC    Component Value Date/Time   WBC 16.6 (H) 09/27/2023 0530   RBC 6.23 (H) 09/27/2023 0530   HGB 17.1 (H) 09/27/2023 0530   HGB 16.6 11/23/2020 1553   HCT 50.1 09/27/2023 0530   HCT 50.0 11/23/2020 1553   PLT 270 09/27/2023 0530   PLT 264 11/23/2020 1553   MCV 80.4 09/27/2023 0530   MCV 84 11/23/2020 1553   MCH 27.4 09/27/2023 0530   MCHC 34.1 09/27/2023 0530   RDW 14.4 09/27/2023 0530   RDW 13.5 11/23/2020 1553   LYMPHSABS 0.4 (L) 09/26/2023 1030   MONOABS 0.8 09/26/2023 1030   EOSABS 0.0 09/26/2023 1030   BASOSABS 0.1 09/26/2023 1030    BMET    Component Value Date/Time   NA 134 (L) 09/27/2023 1302   NA 139 11/23/2020 1553   K 3.6 09/27/2023 1302   CL 101 09/27/2023 1302   CO2 20 (L) 09/27/2023 1302   GLUCOSE 325 (H) 09/27/2023 1302   BUN 29 (H) 09/27/2023 1302   BUN 21 11/23/2020 1553   CREATININE 1.77 (H) 09/27/2023 1302   CALCIUM 9.0 09/27/2023 1302   EGFR 55 (L) 11/23/2020 1553   GFRNONAA 41 (L) 09/27/2023 1302    IMAGING past 24 hours ECHOCARDIOGRAM COMPLETE Result Date: 09/27/2023    ECHOCARDIOGRAM REPORT   Patient Name:   KEISHAUN HAZEL Date of Exam: 09/27/2023 Medical Rec #:  [id number]   Height:       76.0 in Accession #:    [id number]  Weight:       203.3 lb Date of Birth:  July 24, 1956  BSA:          2.231 m Patient Age:    68 years    BP:           124/81 mmHg Patient Gender: M           HR:           90 bpm. Exam Location:  Inpatient Procedure: 2D Echo, Color Doppler and Cardiac  Doppler (Both Spectral and Color            Flow Doppler were utilized during procedure). Indications:    Stroke i63.9  History:        Patient has no prior history of Echocardiogram examinations.                 Risk Factors:Hypertension, Diabetes and Dyslipidemia.  Sonographer:    Irving Burton Senior RDCS Referring Phys: [phone] MCNEILL P KIRKPATRICK IMPRESSIONS  1. Left ventricular ejection fraction, by estimation, is 65 to 70%. Left ventricular ejection fraction by PLAX is 56 %. The left ventricle has normal function. The left ventricle has no regional wall motion abnormalities. Left ventricular diastolic parameters are consistent with Grade I diastolic dysfunction (impaired relaxation).  2. Right ventricular systolic function is normal. The right ventricular size is normal. Tricuspid regurgitation signal is inadequate for assessing PA pressure.  3. The mitral valve is normal in structure. Trivial mitral valve regurgitation.  4. The aortic valve is grossly normal. Aortic valve regurgitation is  not visualized.  5. The inferior vena cava is dilated in size with >50% respiratory variability, suggesting right atrial pressure of 8 mmHg. FINDINGS  Left Ventricle: Left ventricular ejection fraction, by estimation, is 65 to 70%. Left ventricular ejection fraction by PLAX is 56 %. The left ventricle has normal function. The left ventricle has no regional wall motion abnormalities. Strain imaging was  not performed. The left ventricular internal cavity size was normal in size. There is no left ventricular hypertrophy. Left ventricular diastolic function could not be evaluated due to atrial fibrillation. Left ventricular diastolic parameters are consistent with Grade I diastolic dysfunction (impaired relaxation). Right Ventricle: The right ventricular size is normal. No increase in right ventricular wall thickness. Right ventricular systolic function is normal. Tricuspid regurgitation signal is inadequate for assessing PA pressure.  Left Atrium: Left atrial size was normal in size. Right Atrium: Right atrial size was normal in size. Pericardium: There is no evidence of pericardial effusion. Mitral Valve: The mitral valve is normal in structure. Trivial mitral valve regurgitation. Tricuspid Valve: The tricuspid valve is normal in structure. Tricuspid valve regurgitation is not demonstrated. Aortic Valve: The aortic valve is grossly normal. Aortic valve regurgitation is not visualized. Pulmonic Valve: The pulmonic valve was grossly normal. Pulmonic valve regurgitation is not visualized. Aorta: The aortic root and ascending aorta are structurally normal, with no evidence of dilitation. Venous: The inferior vena cava is dilated in size with greater than 50% respiratory variability, suggesting right atrial pressure of 8 mmHg. IAS/Shunts: No atrial level shunt detected by color flow Doppler. Additional Comments: 3D imaging was not performed.  LEFT VENTRICLE PLAX 2D LV EF:         Left ventricular ejection fraction by PLAX is 56 %. LVIDd:         3.50 cm LVIDs:         2.50 cm LV PW:         1.00 cm LV IVS:        1.00 cm LVOT diam:     2.40 cm LV SV:         94 LV SV Index:   42 LVOT Area:     4.52 cm  RIGHT VENTRICLE RV S prime:     18.60 cm/s TAPSE (M-mode): 1.7 cm LEFT ATRIUM             Index        RIGHT ATRIUM           Index LA diam:        2.80 cm 1.26 cm/m   RA Area:     12.50 cm LA Vol (A2C):   38.5 ml 17.26 ml/m  RA Volume:   24.90 ml  11.16 ml/m LA Vol (A4C):   49.4 ml 22.15 ml/m LA Biplane Vol: 47.1 ml 21.12 ml/m  AORTIC VALVE LVOT Vmax:   126.50 cm/s LVOT Vmean:  91.900 cm/s LVOT VTI:    0.207 m  AORTA Ao Root diam: 3.40 cm Ao Asc diam:  3.80 cm  SHUNTS Systemic VTI:  0.21 m Systemic Diam: 2.40 cm Chilton Si MD Electronically signed by Chilton Si MD Signature Date/Time: 09/27/2023/1:51:30 PM    Final    CT HEAD WO CONTRAST ( ) Result Date: 09/27/2023 CLINICAL DATA:  Hemorrhagic stroke follow-up EXAM: CT HEAD  WITHOUT CONTRAST TECHNIQUE: Contiguous axial images were obtained from the base of the skull through the vertex without intravenous contrast. RADIATION DOSE REDUCTION: This exam was performed according to the departmental  dose-optimization program which includes automated exposure control, adjustment of the mA and/or kV according to patient size and/or use of iterative reconstruction technique. COMPARISON:  Head CT and CT from yesterday FINDINGS: Brain: Patchy acute hemorrhage in the anterior left temporal lobe with intervening low-density parenchyma, up to 3.3 cm anterior to posterior, unchanged from prior CTV and progressed from initial head CT. No new area of hemorrhage. No worrisome mass effect. No hydrocephalus. Vascular: Asymmetric low-density at the left transverse and sigmoid dural sinuses. Skull: No acute or aggressive finding Sinuses/Orbits: Incidental mucosal thickening in the paranasal sinuses. IMPRESSION: The left temporal hematoma is unchanged from CT venogram yesterday. No new abnormality. Electronically Signed   By: Tiburcio Pea M.D.   On: 09/27/2023 06:45    Vitals:   09/27/23 1200 09/27/23 1215 09/27/23 1219 09/27/23 1300  BP: 139/74   (!) 166/99  Pulse: 86 94  99  Resp: 16 12  15   Temp: 98.4 F (36.9 C)  98.6 F (37 C)   TempSrc: Oral     SpO2: 90% 96%  97%  Weight:      Height:         PHYSICAL EXAM General:  Alert, well-nourished, well-developed patient in no acute distress Psych:  Mood and affect appropriate for situation CV: Regular rate and rhythm on monitor Respiratory:  Regular, unlabored respirations on supplemental O2 GI: Abdomen soft and nontender   NEURO:  Mental Status: Patient responds to name but is unable to state place, time or situation. He cannot follow commands but will mimic Speech/Language: speech is with receptive and expressive aphasia but no dysarthria.  Social speech is intact, but patient is unable to answer questions or follow  commands  Cranial Nerves:  II: PERRL.  III, IV, VI: EOMI. Eyelids elevate symmetrically.  V: Sensation is intact to light touch and symmetrical to face.  VII: Face is symmetrical resting and smiling VIII: hearing intact to voice. IX, X: Phonation is normal.  ZO:[id number] shrug 5/5. XII: tongue is midline without fasciculations. Motor: Able to move all 4 extremities with good antigravity strength Tone: is normal and bulk is normal Sensation- Intact to light touch bilaterally.  Coordination: Unable to perform Gait- deferred  Most Recent NIH  1a Level of Conscious.: 0 1b LOC Questions: 2 1c LOC Commands: 2 2 Best Gaze: 0 3 Visual: 0 4 Facial Palsy: 0 5a Motor Arm - left: 0 5b Motor Arm - Right: 0 6a Motor Leg - Left: 0 6b Motor Leg - Right: 0 7 Limb Ataxia: 0 8 Sensory: 0 9 Best Language: 2 10 Dysarthria: 0 11 Extinct. and Inatten.:0 TOTAL: 6   ASSESSMENT/PLAN  [name] is a 68 y.o. male with history of hypertension and hyperlipidemia admitted for several days of speech abnormalities.  He was found to have a small left temporal lobe hemorrhage as well as CVST through the left transverse and sigmoid sinuses on CT venogram.  Progression of IPH was seen, and patient was admitted to ICU for close monitoring.  He is anticoagulated with heparin.  CT chest abdomen and pelvis reveals no occult malignancy. 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  Recent flulike illness and sinus infection ?  Related CT head small foci of acute hemorrhage in left temporal lobe CTA head & neck no emergent arterial finding or lesion underlying IPH CT venogram dural venous sinus thrombosis occlusive to nearly occlusive  throughout left transverse and sigmoid sinuses is extending into left upper IJ, progression of patchy left temporal hematoma Repeat head CT 2/14 unchanged  left temporal hematoma MRI asymmetrically decreased flow void in left transverse and sigmoid sinus with filling defects in the left sigmoid sinus and proximal IJ, redemonstrated subarachnoid hemorrhage and left temporal lobe 2D Echo EF 65 to 70%, grade 1 diastolic dysfunction, normal left atrial size, no atrial level shunt LDL 54 HgbA1c 12.8 VTE prophylaxis -fully anticoagulated with heparin No antithrombotic prior to admission, now on heparin IV  Therapy recommendations:  Pending Disposition: Pending  Hypertension Home meds: Amlodipine 10 mg daily, lisinopril 20 mg twice daily Stable Blood Pressure Goal: SBP between 130-150 for 24 hours and then less than 160   Hyperlipidemia Home meds: Rosuvastatin 10 mg daily LDL 54, goal < 70 Resume statin at discharge  Diabetes type II Uncontrolled Home meds: Farxiga 10 mg daily, Rybelsus 7 mg daily HgbA1c 12.8, goal < 7.0 CBGs SSI Diabetes coordinator consult Recommend close follow-up with PCP for better DM control  Dysphagia Patient has post-stroke dysphagia, SLP consulted    Diet   DIET DYS 2 Room service appropriate? No; Fluid consistency: Thin   Advance diet as tolerated  Other Stroke Risk Factors None   Other Active Problems Sinusitis-continue Augmentin  Hospital day # 1  Patient seen by NP with MD, MD to edit note as needed. Cortney E Ernestina Columbia , MSN, AGACNP-BC [facility] See Amion for schedule and pager information 09/27/2023 3:49 PM   STROKE MD NOTE :; I have personally obtained history,examined this patient, reviewed notes, independently viewed imaging studies, participated in medical decision making and plan of care.ROS completed by me personally and pertinent positives fully documented  I have made any additions or clarifications directly to the above note. Agree with note above.  Patient presented with few days history of confusion, agitation and abnormal speech in the setting of recent flulike  illness and sinusitis after being started on Augmentin.  CT head, CT venogram and MRI scan confirmed left transverse and sigmoid sinus thrombosis extending into the jugular vein with left temporal lobe lobar hematoma with mild cytotoxic edema.  Etiology for his cerebral venous sinus thrombosis is indeterminate and unprovoked.  Recommend aggressive IV hydration with normal saline as well as anticoagulation with IV heparin.  Speech therapy for swallow eval and may need a feeding tube.  Will check CT scan chest abdomen pelvis for malignancy.  No prior history of DVT, pulmonary embolism or abnormal clotting as per family.  Long discussion with sister at the bedside and answered questions.  Repeat CT head tomorrow as patient is unable to have an MRI due to spinal cord stimulator This patient is critically ill and at significant risk of neurological worsening, death and care requires constant monitoring of vital signs, hemodynamics,respiratory and cardiac monitoring, extensive review of multiple databases, frequent neurological assessment, discussion with family, other specialists and medical decision making of high complexity.I have made any additions or clarifications directly to the above note.This critical care time does not reflect procedure time, or teaching time or supervisory time of PA/NP/Med Resident etc but could involve care discussion time.  I spent 40 minutes of neurocritical care time  in the care of  this patient.     Delia Heady, MD Medical Director [facility] Pager: [phone] 09/27/2023 4:31 PM   To contact Stroke Continuity provider, please refer to [email or web address]. After hours, contact General Neurology

## 2023-09-27 NOTE — Evaluation (Signed)
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 Head CT revealed a small area of acute hemorrhage and MRI revealed findings concerning for venous sinus thrombosis. This was confirmed on CT venogram which also noted expansion of his temporal lobe hematoma. PMH includes: T2DM, HTN, HLD, recent dx sinusitis   Assessment / Plan / Recommendation Clinical Impression  Pt has receptive and expressive language deficits with possible motor planning difficulties as well. He does not follow one-step commands, but can intermittently do so when given a model. Pt does not perform automatic speech tasks, but counted on his fingers when given tactile/visual cues and repetitive trials. Pt answered yes to all yes/no questions, making it less reliable for communication, but he also tries to use some simple gestures to convey basic messages. Attempted to utilize written and gestural responses to increaes accuracy with yes/no questions but he continued to give affirmative responses. He verbalized appropiately a few times across the session, primarily consisting of social phrases, but he also told me his first name and his wife's name. He seemed a little inattentive to her on his L side when first asked. Pt at baseline is completely independent and he would benefit from intensive SLP f/u to maximize functional communication.    SLP Assessment  SLP  Recommendation/Assessment: Patient needs continued Speech Lanaguage Pathology Services SLP Visit Diagnosis: Aphasia (R47.01)    Recommendations for follow up therapy are one component of a multi-disciplinary discharge planning process, led by the attending physician.  Recommendations may be updated based on patient status, additional functional criteria and insurance authorization.    Follow Up Recommendations  Acute inpatient rehab (3hours/day)    Assistance Recommended at Discharge  Frequent or constant Supervision/Assistance  Functional Status Assessment Patient has had a recent decline in their functional status and demonstrates the ability to make significant improvements in function in a reasonable and predictable amount of time.  Frequency and Duration min 2x/week  2 weeks      SLP Evaluation Cognition  Overall Cognitive Status: Difficult to assess (aphasia) Arousal/Alertness: Awake/alert Orientation Level: Oriented to person Attention: Sustained Sustained Attention: Appears intact (sustained attention to self-feeding task)       Comprehension  Auditory Comprehension Overall Auditory Comprehension: Impaired Yes/No Questions: Impaired Basic Biographical Questions: 26-50% accurate Basic Immediate Environment Questions: 50-74% accurate Commands: Impaired One Step Basic Commands: 0-24% accurate Interfering Components: Motor planning;Hearing EffectiveTechniques: Visual/Gestural cues Reading Comprehension Reading Status: Impaired Word level: Impaired Sentence Level: Impaired    Expression Expression Primary Mode of Expression: Verbal Verbal Expression Overall Verbal Expression: Impaired Automatic Speech: Name;Social Response Level of Generative/Spontaneous Verbalization: Word Repetition: Impaired Level of Impairment: Word level Non-Verbal Means of Communication: Gestures   Oral / Motor  Oral Motor/Sensory Function Overall Oral Motor/Sensory Function:  (difficulty  following commands for assessment, no clear focal deficits) Motor Speech Overall Motor Speech: Other (comment) (articulation intact but question motor planning)  Mahala Menghini., M.A. CCC-SLP Acute Rehabilitation Services Office [phone]  Secure chat preferred  09/27/2023, 1:25 PM

## 2023-09-27 NOTE — Progress Notes (Signed)
PHARMACY - ANTICOAGULATION CONSULT NOTE  Pharmacy Consult for heparin Indication: venous sinus thrombosis  Allergies  Allergen Reactions   Tape Rash    Patient Measurements: Height: 6\' 4"  (193 cm) Weight: 92.2 kg (203 lb 4.2 oz) IBW/kg (Calculated) : 86.8 Heparin Dosing Weight: TBW  Vital Signs: Temp: 98.4 F (36.9 C) (02/14 2000) Temp Source: Axillary (02/14 2000) BP: 129/107 (02/14 2200) Pulse Rate: 86 (02/14 2200)  Labs: Recent Labs    09/26/23 1030 09/27/23 0530 09/27/23 1302 09/27/23 2142  HGB 17.9* 17.1*  --   --   HCT 52.7* 50.1  --   --   PLT 286 270  --   --   HEPARINUNFRC  --  <0.10* 0.29* 0.75*  CREATININE 1.68* 1.70* 1.77*  --     Estimated Creatinine Clearance: 48.4 mL/min (A) (by C-G formula based on SCr of 1.77 mg/dL (H)).   Medical History: Past Medical History:  Diagnosis Date   Diabetes mellitus without complication (HCC)    Hyperlipidemia    Hypertension     Assessment: 15 YOM presenting with AMS, CT venogram with dural venous sinus thrombosis (occlusive)/SAH, pharmacy consulted for conservative heparin dosing, will target low end goals and no bolus.  Heparin level now supratherapeutic  (0.75) on infusion at 1500 units/hr. No issues with line or bleeding reported per RN.  Goal of Therapy:  Heparin level 0.3-0.5 units/ml Monitor platelets by anticoagulation protocol: Yes   Plan:  Decrease heparin to 1350 units/hr F/u 8 hr heparin level  Christoper Fabian, PharmD, BCPS Please see amion for complete clinical pharmacist phone list 09/27/2023, 10:45 PM

## 2023-09-27 NOTE — Progress Notes (Signed)
PHARMACY - ANTICOAGULATION CONSULT NOTE  Pharmacy Consult for heparin Indication: venous sinus thrombosis  Allergies  Allergen Reactions   Tape Rash    Patient Measurements: Height: 6\' 4"  (193 cm) Weight: 92.2 kg (203 lb 4.2 oz) IBW/kg (Calculated) : 86.8 Heparin Dosing Weight: TBW  Vital Signs: Temp: 98.6 F (37 C) (02/14 1219) Temp Source: Oral (02/14 1200) BP: 166/99 (02/14 1300) Pulse Rate: 99 (02/14 1300)  Labs: Recent Labs    09/26/23 1030 09/27/23 0530 09/27/23 1302  HGB 17.9* 17.1*  --   HCT 52.7* 50.1  --   PLT 286 270  --   HEPARINUNFRC  --  <0.10* 0.29*  CREATININE 1.68* 1.70* 1.77*    Estimated Creatinine Clearance: 48.4 mL/min (A) (by C-G formula based on SCr of 1.77 mg/dL (H)).   Medical History: Past Medical History:  Diagnosis Date   Diabetes mellitus without complication (HCC)    Hyperlipidemia    Hypertension     Assessment: 1 YOM presenting with AMS, CT venogram with dural venous sinus thrombosis (occlusive)/SAH, pharmacy consulted for conservative heparin dosing, will target low end goals and no bolus.  Heparin level slightly low; no issue with heparin infusion nor bleeding per RN.  Goal of Therapy:  Heparin level 0.3-0.5 units/ml Monitor platelets by anticoagulation protocol: Yes   Plan:  Increase heparin gtt at 1500 units/hr, no bolus Check 6 hr heparin level Daily heparin level and CBC  Matthias Bogus D. Laney Potash, PharmD, BCPS, BCCCP 09/27/2023, 2:58 PM

## 2023-09-27 NOTE — Inpatient Diabetes Management (Signed)
Inpatient Diabetes Program Recommendations  AACE/ADA: New Consensus Statement on Inpatient Glycemic Control (2015)  Target Ranges:  Prepandial:   less than 140 mg/dL      Peak postprandial:   less than 180 mg/dL (1-2 hours)      Critically ill patients:  140 - 180 mg/dL   Lab Results  Component Value Date   GLUCAP 258 (H) 09/27/2023   HGBA1C 12.8 (H) 11/23/2020    Latest Reference Range & Units 09/26/23 10:16 09/26/23 17:46 09/26/23 21:25 09/27/23 07:54  Glucose-Capillary 70 - 99 mg/dL 161 (H) 096 (H) 045 (H) 258 (H)  (H): Data is abnormally high Review of Glycemic Control  Diabetes history: DM2 Outpatient Diabetes medications: Farxiga 10 mg daily Current orders for Inpatient glycemic control: Novolog 0-9 units correction scale TID, Novolog 0-5 units HS scale  Inpatient Diabetes Program Recommendations:   Noted that CBGs have been greater than 180 mg/dl.   Recommend changing Novolog 0-9 units correction scale to every 4 hours while NPO.   Will continue to monitor blood sugars while in the hospital.  Smith Mince RN BSN CDE Diabetes Coordinator Pager: [phone]  8am-5pm

## 2023-09-27 NOTE — Plan of Care (Addendum)
Patient admitted s/p stroke. Patient able to work with speech pathology today, diet order placed. Patient encouraged to increase PO intake. Patient became increasingly agitated during shift, provider notified, PRN seroquel given once, patient responded well to treatment. Patient and family updated in plan of care. ICU status maintained. Problem: Education: Goal: Knowledge of General Education information will improve Description: Including pain rating scale, medication(s)/side effects and non-pharmacologic comfort measures Outcome: Progressing   Problem: Health Behavior/Discharge Planning: Goal: Ability to manage health-related needs will improve Outcome: Progressing   Problem: Clinical Measurements: Goal: Ability to maintain clinical measurements within normal limits will improve Outcome: Progressing Goal: Will remain free from infection Outcome: Progressing Goal: Diagnostic test results will improve Outcome: Progressing Goal: Respiratory complications will improve Outcome: Progressing Goal: Cardiovascular complication will be avoided Outcome: Progressing   Problem: Activity: Goal: Risk for activity intolerance will decrease Outcome: Progressing   Problem: Nutrition: Goal: Adequate nutrition will be maintained Outcome: Progressing   Problem: Coping: Goal: Level of anxiety will decrease Outcome: Progressing   Problem: Elimination: Goal: Will not experience complications related to bowel motility Outcome: Progressing Goal: Will not experience complications related to urinary retention Outcome: Progressing   Problem: Pain Managment: Goal: General experience of comfort will improve and/or be controlled Outcome: Progressing   Problem: Safety: Goal: Ability to remain free from injury will improve Outcome: Progressing   Problem: Skin Integrity: Goal: Risk for impaired skin integrity will decrease Outcome: Progressing   Problem: Education: Goal: Ability to describe  self-care measures that may prevent or decrease complications (Diabetes Survival Skills Education) will improve Outcome: Progressing Goal: Individualized Educational Video(s) Outcome: Progressing   Problem: Coping: Goal: Ability to adjust to condition or change in health will improve Outcome: Progressing   Problem: Fluid Volume: Goal: Ability to maintain a balanced intake and output will improve Outcome: Progressing   Problem: Health Behavior/Discharge Planning: Goal: Ability to identify and utilize available resources and services will improve Outcome: Progressing Goal: Ability to manage health-related needs will improve Outcome: Progressing   Problem: Metabolic: Goal: Ability to maintain appropriate glucose levels will improve Outcome: Progressing   Problem: Nutritional: Goal: Maintenance of adequate nutrition will improve Outcome: Progressing Goal: Progress toward achieving an optimal weight will improve Outcome: Progressing   Problem: Skin Integrity: Goal: Risk for impaired skin integrity will decrease Outcome: Progressing   Problem: Tissue Perfusion: Goal: Adequacy of tissue perfusion will improve Outcome: Progressing   Problem: Education: Goal: Knowledge of disease or condition will improve Outcome: Progressing Goal: Knowledge of secondary prevention will improve (MUST DOCUMENT ALL) Outcome: Progressing Goal: Knowledge of patient specific risk factors will improve (DELETE if not current risk factor) Outcome: Progressing   Problem: Ischemic Stroke/TIA Tissue Perfusion: Goal: Complications of ischemic stroke/TIA will be minimized Outcome: Progressing   Problem: Coping: Goal: Will verbalize positive feelings about self Outcome: Progressing Goal: Will identify appropriate support needs Outcome: Progressing   Problem: Health Behavior/Discharge Planning: Goal: Ability to manage health-related needs will improve Outcome: Progressing Goal: Goals will be  collaboratively established with patient/family Outcome: Progressing   Problem: Self-Care: Goal: Ability to participate in self-care as condition permits will improve Outcome: Progressing Goal: Verbalization of feelings and concerns over difficulty with self-care will improve Outcome: Progressing Goal: Ability to communicate needs accurately will improve Outcome: Progressing   Problem: Nutrition: Goal: Risk of aspiration will decrease Outcome: Progressing Goal: Dietary intake will improve Outcome: Progressing   Problem: Education: Goal: Knowledge of disease or condition will improve Outcome: Progressing Goal: Knowledge of secondary prevention will  improve (MUST DOCUMENT ALL) Outcome: Progressing Goal: Knowledge of patient specific risk factors will improve (DELETE if not current risk factor) Outcome: Progressing   Problem: Intracerebral Hemorrhage Tissue Perfusion: Goal: Complications of Intracerebral Hemorrhage will be minimized Outcome: Progressing   Problem: Coping: Goal: Will verbalize positive feelings about self Outcome: Progressing Goal: Will identify appropriate support needs Outcome: Progressing   Problem: Health Behavior/Discharge Planning: Goal: Ability to manage health-related needs will improve Outcome: Progressing Goal: Goals will be collaboratively established with patient/family Outcome: Progressing

## 2023-09-27 NOTE — Progress Notes (Signed)
Echocardiogram 2D Echocardiogram has been performed.  Warren Lacy Amerie Beaumont RDCS 09/27/2023, 10:31 AM

## 2023-09-27 NOTE — Progress Notes (Signed)
PHARMACY - ANTICOAGULATION CONSULT NOTE  Pharmacy Consult for heparin Indication:  venous sinus thrombosis  Labs: Recent Labs    09/26/23 1030 09/27/23 0530  HGB 17.9*  --   HCT 52.7*  --   PLT 286  --   HEPARINUNFRC  --  <0.10*  CREATININE 1.68*  --    Assessment: 67yo male subtherapeutic on heparin with initial dosing for VST in setting of SAH; no infusion issues or signs of bleeding or neuro changes per RN.  Goal of Therapy:  Heparin level 0.3-0.5 units/ml   Plan:  Increase heparin infusion by 2 units/kg/hr to 1400 units/hr. Check level in 6 hours.   Howard Johnson, PharmD, BCPS 09/27/2023 6:51 AM

## 2023-09-27 NOTE — Evaluation (Signed)
Clinical/Bedside Swallow Evaluation Patient Details  Name: Howard Johnson MRN: [id number] Date of Birth: [birth year]  Today's Date: 09/27/2023 Time: SLP Start Time (ACUTE ONLY): 1057 SLP Stop Time (ACUTE ONLY): 1113 SLP Time Calculation (min) (ACUTE ONLY): 16 min  Past Medical History:  Past Medical History:  Diagnosis Date   Diabetes mellitus without complication (HCC)    Hyperlipidemia    Hypertension    Past Surgical History:  Past Surgical History:  Procedure Laterality Date   TONSILLECTOMY     HPI:  68 yo presenting 2/13 with several days of abnormal speech. Head CT revealed a small area of acute hemorrhage and MRI revealed findings concerning for venous sinus thrombosis. This was confirmed on CT venogram which also noted expansion of his temporal lobe hematoma. PMH includes: T2DM, HTN, HLD, recent dx sinusitis    Assessment / Plan / Recommendation  Clinical Impression  Pt presents with functional appearing swallow, noting that he did pass a Yale swallow screen as well, which would suggest lower risk for aspiration. Pt seemed to have appropriate oral preparation and clearance when given purees and with single piece of graham cracker. He did cough x1 with cracker in his mouth, which could be related to premature spillage, but family shares that he has been coughing a lot at home (as have other family members) with recent illness. Recommend starting with mechanical soft diet but family would like to progress a little slower, trying more pudding. We ultimately agreed to meet in the middle with Dys 2 diet and thin liquids. SLP will follow and advance as indicated. SLP Visit Diagnosis: Dysphagia, unspecified (R13.10)    Aspiration Risk  Mild aspiration risk    Diet Recommendation Dysphagia 2 (Fine chop);Thin liquid    Liquid Administration via: Cup;Straw Medication Administration: Whole meds with liquid Supervision: Patient able to self feed;Intermittent supervision to cue for  compensatory strategies Compensations: Slow rate;Small sips/bites Postural Changes: Seated upright at 90 degrees    Other  Recommendations Oral Care Recommendations: Oral care BID    Recommendations for follow up therapy are one component of a multi-disciplinary discharge planning process, led by the attending physician.  Recommendations may be updated based on patient status, additional functional criteria and insurance authorization.  Follow up Recommendations Acute inpatient rehab (3hours/day)      Assistance Recommended at Discharge    Functional Status Assessment Patient has had a recent decline in their functional status and demonstrates the ability to make significant improvements in function in a reasonable and predictable amount of time.  Frequency and Duration min 2x/week  2 weeks       Prognosis Prognosis for improved oropharyngeal function: Good Barriers to Reach Goals: Language deficits      Swallow Study   General HPI: 68 yo presenting 2/13 with several days of abnormal speech. Head CT revealed a small area of acute hemorrhage and MRI revealed findings concerning for venous sinus thrombosis. This was confirmed on CT venogram which also noted expansion of his temporal lobe hematoma. PMH includes: T2DM, HTN, HLD, recent dx sinusitis Type of Study: Bedside Swallow Evaluation Previous Swallow Assessment: none in chart Diet Prior to this Study: NPO Temperature Spikes Noted: No Respiratory Status: Room air History of Recent Intubation: No Behavior/Cognition: Alert;Cooperative;Requires cueing Oral Cavity Assessment: Within Functional Limits Oral Care Completed by SLP: No Oral Cavity - Dentition: Adequate natural dentition Vision: Functional for self-feeding Self-Feeding Abilities: Able to feed self Patient Positioning: Upright in bed Baseline Vocal Quality: Normal Volitional Cough: Cognitively unable to  elicit Volitional Swallow: Unable to elicit     Oral/Motor/Sensory Function Overall Oral Motor/Sensory Function:  (difficulty following commands for assessment, no clear focal deficits)   Ice Chips Ice chips: Not tested   Thin Liquid Thin Liquid: Within functional limits Presentation: Self Fed;Straw    Nectar Thick Nectar Thick Liquid: Not tested   Honey Thick Honey Thick Liquid: Not tested   Puree Puree: Within functional limits Presentation: Self Fed;Spoon   Solid     Solid: Impaired Presentation: Self Fed Pharyngeal Phase Impairments: Cough - Immediate      Mahala Menghini., M.A. CCC-SLP Acute Rehabilitation Services Office [phone]  Secure chat preferred  09/27/2023,1:01 PM

## 2023-09-27 NOTE — Telephone Encounter (Signed)

## 2023-09-27 NOTE — Progress Notes (Signed)
PT Cancellation Note  Patient Details Name: Howard Johnson MRN: [id number] DOB: 1956/05/16   Cancelled Treatment:    Reason Eval/Treat Not Completed: Medical issues which prohibited therapy - new dural venous thrombosis, per rehab protocol await PT eval until on heparin x24 hours.  Marye Round, PT DPT Acute Rehabilitation Services Secure Chat Preferred  Office [phone]    Truddie Coco 09/27/2023, 9:27 AM

## 2023-09-28 DIAGNOSIS — G08 Intracranial and intraspinal phlebitis and thrombophlebitis: Secondary | ICD-10-CM | POA: Diagnosis not present

## 2023-09-28 LAB — CBC
HCT: 45.1 % (ref 39.0–52.0)
Hemoglobin: 15.1 g/dL (ref 13.0–17.0)
MCH: 27.1 pg (ref 26.0–34.0)
MCHC: 33.5 g/dL (ref 30.0–36.0)
MCV: 80.8 fL (ref 80.0–100.0)
Platelets: 210 10*3/uL (ref 150–400)
RBC: 5.58 MIL/uL (ref 4.22–5.81)
RDW: 14.2 % (ref 11.5–15.5)
WBC: 11.2 10*3/uL — ABNORMAL HIGH (ref 4.0–10.5)
nRBC: 0 % (ref 0.0–0.2)

## 2023-09-28 LAB — GLUCOSE, CAPILLARY
Glucose-Capillary: 258 mg/dL — ABNORMAL HIGH (ref 70–99)
Glucose-Capillary: 287 mg/dL — ABNORMAL HIGH (ref 70–99)
Glucose-Capillary: 350 mg/dL — ABNORMAL HIGH (ref 70–99)
Glucose-Capillary: 192 mg/dL — ABNORMAL HIGH (ref 70–99)
Glucose-Capillary: 208 mg/dL — ABNORMAL HIGH (ref 70–99)

## 2023-09-28 LAB — HEPARIN LEVEL (UNFRACTIONATED)
Heparin Unfractionated: 0.84 [IU]/mL — ABNORMAL HIGH (ref 0.30–0.70)
Heparin Unfractionated: <0.1 [IU]/mL — ABNORMAL LOW (ref 0.30–0.70)

## 2023-09-28 LAB — ANTITHROMBIN III: AntiThromb III Func: 74 % — ABNORMAL LOW (ref 75–120)

## 2023-09-28 MED ORDER — APIXABAN 5 MG PO TABS
5.0000 mg | ORAL_TABLET | Freq: Two times a day (BID) | ORAL | Status: DC
  Administered 2023-09-28 – 2023-10-01 (×7): 5 mg via ORAL
  Filled 2023-09-28 (×7): qty 1

## 2023-09-28 MED ORDER — INSULIN GLARGINE-YFGN 100 UNIT/ML ~~LOC~~ SOLN
15.0000 [IU] | Freq: Every day | SUBCUTANEOUS | Status: DC
  Administered 2023-09-28 – 2023-09-29 (×2): 15 [IU] via SUBCUTANEOUS
  Filled 2023-09-28 (×2): qty 0.15

## 2023-09-28 MED ORDER — HEPARIN (PORCINE) 25000 UT/250ML-% IV SOLN: 1100.0000 [IU]/h | INTRAVENOUS | Status: DC

## 2023-09-28 MED ORDER — LABETALOL HCL 5 MG/ML IV SOLN
10.0000 mg | INTRAVENOUS | Status: DC | PRN
  Administered 2023-09-29: 10 mg via INTRAVENOUS
  Filled 2023-09-28 (×2): qty 4

## 2023-09-28 MED ORDER — QUETIAPINE FUMARATE 25 MG PO TABS: 12.5000 mg | ORAL_TABLET | Freq: Three times a day (TID) | ORAL | Status: DC | PRN

## 2023-09-28 NOTE — Discharge Summary (Shared)
 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   Secondary Diagnoses: Hypertension Hyperlipidemia Diabetes Type II   Allergies as of 10/01/2023       Reactions   Tape Rash        Medication List     STOP taking these medications    amLODipine 5 MG tablet Commonly known as: NORVASC   amoxicillin-clavulanate 500-125 MG tablet Commonly known as: AUGMENTIN   Farxiga 10 MG Tabs tablet Generic drug: dapagliflozin propanediol   guaifenesin 100 MG/5ML syrup Commonly known as: ROBITUSSIN   lisinopril 20 MG tablet Commonly known as: ZESTRIL   predniSONE 10 MG tablet Commonly known as: DELTASONE   Rybelsus 7 MG Tabs Generic drug: Semaglutide       TAKE these medications    acetaminophen 325 MG tablet Commonly known as: TYLENOL Take 2 tablets (650 mg total) by mouth every 4 (four) hours as needed for mild pain (pain score 1-3) (or temp > 37.5 C (99.5 F)).   apixaban 5 MG Tabs tablet Commonly known as: ELIQUIS Take 1 tablet (5 mg total) by mouth 2 (two) times daily.   insulin aspart 100 UNIT/ML injection Commonly known as: novoLOG Inject 0-5 Units into the skin at bedtime.   insulin aspart 100 UNIT/ML injection Commonly known as: novoLOG Inject 0-9 Units into the skin 3 (three) times daily with meals.   insulin aspart 100 UNIT/ML injection Commonly known as: novoLOG Inject 3 Units into the skin 3 (three) times daily with meals.   insulin glargine-yfgn 100 UNIT/ML injection Commonly known as: SEMGLEE Inject 0.18 mLs (18 Units total) into the skin daily. Start taking on: October 02, 2023   ondansetron 4 MG/2ML Soln  injection Commonly known as: ZOFRAN Inject 2 mLs (4 mg total) into the vein every 4 (four) hours as needed for nausea or vomiting.   polyethylene glycol 17 g packet Commonly known as: MIRALAX / GLYCOLAX Take 17 g by mouth daily. Start taking on: October 02, 2023   QUEtiapine 25 MG tablet Commonly known as: SEROQUEL Take 0.5 tablets (12.5 mg total) by mouth 3 (three) times daily as needed (agitation).   rosuvastatin 10 MG tablet Commonly known as: CRESTOR Take 10 mg by mouth daily.   senna-docusate 8.6-50 MG tablet Commonly known as: Senokot-S Take 1 tablet by mouth 2 (two) times daily.        LABORATORY STUDIES CBC    Component Value Date/Time   WBC 11.4 (H) 10/01/2023 0615   RBC 5.61 10/01/2023 0615   HGB 15.3 10/01/2023 0615   HGB 16.6 11/23/2020 1553   HCT 45.4 10/01/2023 0615   HCT 50.0 11/23/2020 1553   PLT 262 10/01/2023 0615   PLT 264 11/23/2020 1553   MCV 80.9 10/01/2023 0615   MCV 84 11/23/2020 1553   MCH 27.3 10/01/2023 0615   MCHC 33.7 10/01/2023 0615   RDW 14.3 10/01/2023 0615   RDW 13.5 11/23/2020 1553   LYMPHSABS 0.4 (L) 09/26/2023 1030   MONOABS 0.8 09/26/2023 1030   EOSABS 0.0 09/26/2023 1030   BASOSABS 0.1 09/26/2023 1030   CMP    Component Value Date/Time  NA 137 10/01/2023 0615   NA 139 11/23/2020 1553   K 3.5 10/01/2023 0615   CL 104 10/01/2023 0615   CO2 21 (L) 10/01/2023 0615   GLUCOSE 157 (H) 10/01/2023 0615   BUN 24 (H) 10/01/2023 0615   BUN 21 11/23/2020 1553   CREATININE 1.26 (H) 10/01/2023 0615   CALCIUM 9.2 10/01/2023 0615   PROT 7.5 09/26/2023 1030   PROT 7.0 11/23/2020 1553   ALBUMIN 3.9 09/26/2023 1030   ALBUMIN 4.8 11/23/2020 1553   AST 18 09/26/2023 1030   ALT 26 09/26/2023 1030   ALKPHOS 101 09/26/2023 1030   BILITOT 1.2 09/26/2023 1030   BILITOT 0.7 11/23/2020 1553   GFRNONAA >60 10/01/2023 0615   COAGSNo results found for: "INR", "PROTIME" Lipid Panel    Component Value Date/Time   CHOL 112 09/27/2023  0530   TRIG 110 09/27/2023 0530   HDL 36 (L) 09/27/2023 0530   CHOLHDL 3.1 09/27/2023 0530   VLDL 22 09/27/2023 0530   LDLCALC 54 09/27/2023 0530   HgbA1C  Lab Results  Component Value Date   HGBA1C 12.4 (H) 09/30/2023   Urine Drug Screen negative Alcohol Level No results found for: "ETH"   SIGNIFICANT DIAGNOSTIC STUDIES VAS US CAROTID Result Date: 09/29/2023 Carotid Arterial Duplex Study Patient Name:  Howard Johnson  Date of Exam:   09/29/2023 Medical Rec #: [id number]    Accession #:    [id number] Date of Birth: October 11, 1955   Patient Gender: M Patient Age:   24 years Exam Location:  [facility] Procedure:      VAS US CAROTID Referring Phys: Marvel Plan --------------------------------------------------------------------------------  Indications:       Speech disturbance. Risk Factors:      Hypertension, hyperlipidemia, Diabetes. Other Factors:     Dural venous sinus thrombosis as well as a subarachnoid                    hemorrhage in the left temporal lobe. Comparison Study:  No prior study on file Performing Technologist: Sherren Kerns RVS  Examination Guidelines: A complete evaluation includes B-mode imaging, spectral Doppler, color Doppler, and power Doppler as needed of all accessible portions of each vessel. Bilateral testing is considered an integral part of a complete examination. Limited examinations for reoccurring indications may be performed as noted.  Right Carotid Findings: +----------+--------+--------+--------+------------------+------------------+           PSV cm/sEDV cm/sStenosisPlaque DescriptionComments           +----------+--------+--------+--------+------------------+------------------+ CCA Prox  100     15                                                   +----------+--------+--------+--------+------------------+------------------+ CCA Distal119     14                                intimal thickening  +----------+--------+--------+--------+------------------+------------------+ ICA Prox  105     16              focal and calcificShadowing          +----------+--------+--------+--------+------------------+------------------+ ICA Mid   95      24                                                   +----------+--------+--------+--------+------------------+------------------+  ICA Distal94      23                                                   +----------+--------+--------+--------+------------------+------------------+ ECA       244     11                                                   +----------+--------+--------+--------+------------------+------------------+ +---------+--------+--+--------+--+ VertebralPSV cm/s70EDV cm/s14 +---------+--------+--+--------+--+  Left Carotid Findings: +----------+--------+--------+--------+------------------+------------------+           PSV cm/sEDV cm/sStenosisPlaque DescriptionComments           +----------+--------+--------+--------+------------------+------------------+ CCA Prox  128     19                                                   +----------+--------+--------+--------+------------------+------------------+ CCA Distal116     20                                intimal thickening +----------+--------+--------+--------+------------------+------------------+ ICA Prox  97      21              calcific                             +----------+--------+--------+--------+------------------+------------------+ ICA Mid   120     36                                                   +----------+--------+--------+--------+------------------+------------------+ ICA Distal109     32                                                   +----------+--------+--------+--------+------------------+------------------+ ECA       224     23                                                    +----------+--------+--------+--------+------------------+------------------+ +----------+--------+--------+--------+-------------------+           PSV cm/sEDV cm/sDescribeArm Pressure (mmHG) +----------+--------+--------+--------+-------------------+ Subclavian130                                         +----------+--------+--------+--------+-------------------+ +---------+--------+--+--------+-+ VertebralPSV cm/s42EDV cm/s7 +---------+--------+--+--------+-+   Summary: Right Carotid: Velocities in the right ICA are consistent with a 1-39% stenosis. Left Carotid: Velocities in the left ICA are consistent with a 1-39% stenosis. Vertebrals:  Bilateral vertebral arteries demonstrate antegrade flow. Subclavians: Right subclavian  artery was not visualized. Normal flow              hemodynamics were seen in the left subclavian artery. *See table(s) above for measurements and observations.     Preliminary    CT HEAD WO CONTRAST ( ) Result Date: 09/29/2023 CLINICAL DATA:  Stroke, hemorrhagic EXAM: CT HEAD WITHOUT CONTRAST TECHNIQUE: Contiguous axial images were obtained from the base of the skull through the vertex without intravenous contrast. RADIATION DOSE REDUCTION: This exam was performed according to the departmental dose-optimization program which includes automated exposure control, adjustment of the mA and/or kV according to patient size and/or use of iterative reconstruction technique. COMPARISON:  CT September 27, 2023 FINDINGS: Brain: Similar size of the anterior left temporal lobe hemorrhage with mild increase in surrounding edema. No substantial change of mass effect. Vascular: No hyperdense vessel. Dural venous sinus thrombosis better seen on prior CTV. Skull: No acute fracture. Sinuses/Orbits: Mostly clear sinuses.  No acute orbital findings. Other: No mastoid effusions. IMPRESSION: Similar size of the anterior left temporal lobe hemorrhage with mild increase in surrounding edema. No  substantial change of mass effect. Electronically Signed   By: Feliberto Harts M.D.   On: 09/29/2023 15:49   EEG adult Result Date: 09/29/2023 Jefferson Fuel, MD     09/29/2023  1:11 PM Routine EEG Report Maximum Reiland is a 68 y.o. male with a history of altered mental status who is undergoing an EEG to evaluate for seizures. Report: This EEG was acquired with electrodes placed according to the International 10-20 electrode system (including Fp1, Fp2, F3, F4, C3, C4, P3, P4, O1, O2, T3, T4, T5, T6, A1, A2, Fz, Cz, Pz). The following electrodes were missing or displaced: none. The occipital dominant rhythm was 4-5 Hz. This activity is reactive to stimulation. Drowsiness was manifested by background fragmentation; deeper stages of sleep were identified by K complexes and sleep spindles. There was no focal slowing. There were no interictal epileptiform discharges. There were no electrographic seizures identified. Photic stimulation and hyperventilation were not performed. Impression and clinical correlation: This EEG was obtained while awake and asleep and is abnormal due to moderate-to-severe diffuse slowing indicative of global cerebral dysfunction. Epileptiform abnormalities were not seen during this recording. Bing Neighbors, MD [facility] [phone] If 7pm- 7am, please page neurology on call as listed in AMION.   ECHOCARDIOGRAM COMPLETE Result Date: 09/27/2023    ECHOCARDIOGRAM REPORT   Patient Name:   KRISTIAN MOGG Date of Exam: 09/27/2023 Medical Rec #:  [id number]   Height:       76.0 in Accession #:    [id number]  Weight:       203.3 lb Date of Birth:  04/02/1956  BSA:          2.231 m Patient Age:    56 years    BP:           124/81 mmHg Patient Gender: M           HR:           90 bpm. Exam Location:  Inpatient Procedure: 2D Echo, Color Doppler and Cardiac Doppler (Both Spectral and Color            Flow Doppler were utilized during procedure). Indications:    Stroke i63.9  History:         Patient has no prior history of Echocardiogram examinations.                 Risk Factors:Hypertension,  Diabetes and Dyslipidemia.  Sonographer:    Irving Burton Senior RDCS Referring Phys: [phone] MCNEILL P KIRKPATRICK IMPRESSIONS  1. Left ventricular ejection fraction, by estimation, is 65 to 70%. Left ventricular ejection fraction by PLAX is 56 %. The left ventricle has normal function. The left ventricle has no regional wall motion abnormalities. Left ventricular diastolic parameters are consistent with Grade I diastolic dysfunction (impaired relaxation).  2. Right ventricular systolic function is normal. The right ventricular size is normal. Tricuspid regurgitation signal is inadequate for assessing PA pressure.  3. The mitral valve is normal in structure. Trivial mitral valve regurgitation.  4. The aortic valve is grossly normal. Aortic valve regurgitation is not visualized.  5. The inferior vena cava is dilated in size with >50% respiratory variability, suggesting right atrial pressure of 8 mmHg. FINDINGS  Left Ventricle: Left ventricular ejection fraction, by estimation, is 65 to 70%. Left ventricular ejection fraction by PLAX is 56 %. The left ventricle has normal function. The left ventricle has no regional wall motion abnormalities. Strain imaging was  not performed. The left ventricular internal cavity size was normal in size. There is no left ventricular hypertrophy. Left ventricular diastolic function could not be evaluated due to atrial fibrillation. Left ventricular diastolic parameters are consistent with Grade I diastolic dysfunction (impaired relaxation). Right Ventricle: The right ventricular size is normal. No increase in right ventricular wall thickness. Right ventricular systolic function is normal. Tricuspid regurgitation signal is inadequate for assessing PA pressure. Left Atrium: Left atrial size was normal in size. Right Atrium: Right atrial size was normal in size. Pericardium: There is no evidence  of pericardial effusion. Mitral Valve: The mitral valve is normal in structure. Trivial mitral valve regurgitation. Tricuspid Valve: The tricuspid valve is normal in structure. Tricuspid valve regurgitation is not demonstrated. Aortic Valve: The aortic valve is grossly normal. Aortic valve regurgitation is not visualized. Pulmonic Valve: The pulmonic valve was grossly normal. Pulmonic valve regurgitation is not visualized. Aorta: The aortic root and ascending aorta are structurally normal, with no evidence of dilitation. Venous: The inferior vena cava is dilated in size with greater than 50% respiratory variability, suggesting right atrial pressure of 8 mmHg. IAS/Shunts: No atrial level shunt detected by color flow Doppler. Additional Comments: 3D imaging was not performed.  LEFT VENTRICLE PLAX 2D LV EF:         Left ventricular ejection fraction by PLAX is 56 %. LVIDd:         3.50 cm LVIDs:         2.50 cm LV PW:         1.00 cm LV IVS:        1.00 cm LVOT diam:     2.40 cm LV SV:         94 LV SV Index:   42 LVOT Area:     4.52 cm  RIGHT VENTRICLE RV S prime:     18.60 cm/s TAPSE (M-mode): 1.7 cm LEFT ATRIUM             Index        RIGHT ATRIUM           Index LA diam:        2.80 cm 1.26 cm/m   RA Area:     12.50 cm LA Vol (A2C):   38.5 ml 17.26 ml/m  RA Volume:   24.90 ml  11.16 ml/m LA Vol (A4C):   49.4 ml 22.15 ml/m LA Biplane Vol: 47.1 ml 21.12  ml/m  AORTIC VALVE LVOT Vmax:   126.50 cm/s LVOT Vmean:  91.900 cm/s LVOT VTI:    0.207 m  AORTA Ao Root diam: 3.40 cm Ao Asc diam:  3.80 cm  SHUNTS Systemic VTI:  0.21 m Systemic Diam: 2.40 cm Chilton Si MD Electronically signed by Chilton Si MD Signature Date/Time: 09/27/2023/1:51:30 PM    Final    CT HEAD WO CONTRAST ( ) Result Date: 09/27/2023 CLINICAL DATA:  Hemorrhagic stroke follow-up EXAM: CT HEAD WITHOUT CONTRAST TECHNIQUE: Contiguous axial images were obtained from the base of the skull through the vertex without intravenous contrast.  RADIATION DOSE REDUCTION: This exam was performed according to the departmental dose-optimization program which includes automated exposure control, adjustment of the mA and/or kV according to patient size and/or use of iterative reconstruction technique. COMPARISON:  Head CT and CT from yesterday FINDINGS: Brain: Patchy acute hemorrhage in the anterior left temporal lobe with intervening low-density parenchyma, up to 3.3 cm anterior to posterior, unchanged from prior CTV and progressed from initial head CT. No new area of hemorrhage. No worrisome mass effect. No hydrocephalus. Vascular: Asymmetric low-density at the left transverse and sigmoid dural sinuses. Skull: No acute or aggressive finding Sinuses/Orbits: Incidental mucosal thickening in the paranasal sinuses. IMPRESSION: The left temporal hematoma is unchanged from CT venogram yesterday. No new abnormality. Electronically Signed   By: Tiburcio Pea M.D.   On: 09/27/2023 06:45   CT VENOGRAM HEAD Result Date: 09/26/2023 CLINICAL DATA:  Dural venous sinus thrombosis EXAM: CT VENOGRAM HEAD TECHNIQUE: Venographic phase images of the brain were obtained following the administration of intravenous contrast. Multiplanar reformats and maximum intensity projections were generated. RADIATION DOSE REDUCTION: This exam was performed according to the departmental dose-optimization program which includes automated exposure control, adjustment of the mA and/or kV according to patient size and/or use of iterative reconstruction technique. CONTRAST:  60mL OMNIPAQUE IOHEXOL 350 MG/ML SOLN COMPARISON:  Brain MRI from earlier today FINDINGS: Confirmed filling defect in the left transverse and sigmoid sinus, occlusive to nearly occlusive throughout and seen extending into the upper left internal jugular vein. No underlying aggressive or obstructive lesion is seen. Progression of patchy high-density hemorrhage in the left temporal lobe measuring 3.3 cm. Prelim sent in epic  chat. IMPRESSION: 1. Confirmed dural venous sinus thrombosis which is occlusive to nearly occlusive throughout the left transverse and sigmoid sinuses, extending into the left upper IJ. 2. Patchy left temporal hematoma has progressed from initial head CT, hemorrhagic area measuring up to 3.5 cm Electronically Signed   By: Tiburcio Pea M.D.   On: 09/26/2023 19:38   CT ANGIO HEAD W OR WO CONTRAST Result Date: 09/26/2023 CLINICAL DATA:  Subarachnoid hemorrhage.  Left temporal ICH. EXAM: CT ANGIOGRAPHY HEAD TECHNIQUE: Multidetector CT imaging of the head was performed using the standard protocol during bolus administration of intravenous contrast. Multiplanar CT image reconstructions and MIPs were obtained to evaluate the vascular anatomy. RADIATION DOSE REDUCTION: This exam was performed according to the departmental dose-optimization program which includes automated exposure control, adjustment of the mA and/or kV according to patient size and/or use of iterative reconstruction technique. CONTRAST:  60mL OMNIPAQUE IOHEXOL 350 MG/ML SOLN COMPARISON:  Brain MRI from earlier today FINDINGS: CTA HEAD Anterior circulation: Atheromatous calcification of the cavernous carotids. No branch occlusion, beading, or aneurysm. No evidence of vascular malformation. No spot sign. Scattered atheromatous irregularity of medium size branches. Posterior circulation: Atheromatous irregularity of the medium size vessels. No branch occlusion, beading, or aneurysm. Venous sinuses: See  dedicated CT venogram. Anatomic variants: None significant Review of the MIP images confirms the above findings. IMPRESSION: No emergent arterial finding or arterial lesion seen underlying the left temporal hematoma. Reference CT venogram reported separately. Electronically Signed   By: Tiburcio Pea M.D.   On: 09/26/2023 19:33   CT CHEST ABDOMEN PELVIS W CONTRAST Result Date: 09/26/2023 CLINICAL DATA:  Altered mental status.  Concern for sepsis.  EXAM: CT CHEST, ABDOMEN, AND PELVIS WITH CONTRAST TECHNIQUE: Multidetector CT imaging of the chest, abdomen and pelvis was performed following the standard protocol during bolus administration of intravenous contrast. RADIATION DOSE REDUCTION: This exam was performed according to the departmental dose-optimization program which includes automated exposure control, adjustment of the mA and/or kV according to patient size and/or use of iterative reconstruction technique. CONTRAST:  80mL OMNIPAQUE IOHEXOL 300 MG/ML  SOLN COMPARISON:  None Available. FINDINGS: CT CHEST FINDINGS Cardiovascular: Normal heart size. No pericardial effusion. Foci of coronary artery calcification. Aortic valvular calcification. The thoracic aorta is normal in caliber with atherosclerotic calcification. Mediastinum/Nodes: No enlarged mediastinal, hilar, or axillary lymph nodes. There is a 6 mm hypodense focus in the left thyroid lobe, for which no follow-up imaging is required. Moderate-sized hiatal hernia. Lungs/Pleura: Evaluation is slightly limited due to respiratory motion. No focal consolidation, pleural effusion, or pneumothorax. No suspicious pulmonary nodule. Musculoskeletal: No acute osseous abnormality. No suspicious osseous lesion. CT ABDOMEN PELVIS FINDINGS Hepatobiliary: A few subcentimeter focal hypodensities within the right hepatic lobe are too small to definitively characterize. Gallbladder is unremarkable. No biliary dilatation. Pancreas: Unremarkable. No pancreatic ductal dilatation or surrounding inflammatory changes. Spleen: Normal in size without focal abnormality. Adrenals/Urinary Tract: Adrenal glands are unremarkable. Bilateral renal cysts, measuring up to 2.7 cm at the superior pole of the right kidney and 2.5 cm at the superior pole of the left kidney. Small foci of calcification in the inferior pole of the left kidney and midpole of the right kidney could represent vascular calcifications or nonobstructive calculi.  No hydronephrosis. The bladder demonstrates homogeneous increased intraluminal density. Stomach/Bowel: Moderate-sized hiatal hernia. The stomach is otherwise within normal limits. A duodenal diverticulum is noted. Appendix appears normal. No evidence of bowel wall thickening, distention, or inflammatory changes. Vascular/Lymphatic: The abdominal aorta is normal in caliber with atherosclerotic calcification. No enlarged abdominal or pelvic lymph nodes. Reproductive: The prostate is enlarged measuring up to 5.6 cm in diameter. Other: No abdominopelvic ascites. No intraperitoneal free air. Tiny fat containing bilateral inguinal hernias. Musculoskeletal: No acute osseous abnormality. No suspicious osseous lesion. Degenerative changes of the thoracolumbar spine and bilateral hips. IMPRESSION: 1. No acute localizing findings in the chest, abdomen, or pelvis. 2. Moderate-sized hiatal hernia. 3. Homogeneous increased intraluminal density within the bladder is nonspecific. Recommend clinical correlation with recent history of intravenous contrast administration and urinalysis. If there is concern for hematuria further evaluation with cystoscopy should be considered. 4. Prostatomegaly. 5.  Aortic Atherosclerosis (ICD10-I70.0). Electronically Signed   By: Hart Robinsons M.D.   On: 09/26/2023 14:56   MR Brain W and Wo Contrast Result Date: 09/26/2023 CLINICAL DATA:  Stroke, follow up Headache, neuro deficit EXAM: MRI HEAD WITHOUT AND WITH CONTRAST TECHNIQUE: Multiplanar, multiecho pulse sequences of the brain and surrounding structures were obtained without and with intravenous contrast. CONTRAST:  10mL GADAVIST GADOBUTROL 1 MMOL/ML IV SOLN COMPARISON:  Head CT 09/26/2023 FINDINGS: Brain: Negative for an acute infarct. No hydrocephalus. No extra-axial fluid collection. No mass effect. No mass lesion. There is background of mild chronic microvascular ischemic change. Redemonstrated  subarachnoid hemorrhage in the left  temporal lobe. No contrast-enhancing lesions visualized Vascular: Asymmetrically decreased flow void in the left transverse and sigmoid sinus with filling defects in the left sigmoid sinus and proximal internal jugular vein, as well as of the right sigmoid/transverse sinus junction. Findings are worrisome for dural venous sinus thrombosis. Skull and upper cervical spine: Normal marrow signal. Sinuses/Orbits: No middle ear or mastoid effusion. Mucosal thickening in the bilateral maxillary sinuses. Orbits are unremarkable. Other: None. IMPRESSION: 1. Asymmetrically decreased flow void in the left transverse and sigmoid sinus with filling defects in the left sigmoid sinus and proximal internal jugular vein, as well as of the right sigmoid/transverse sinus junction. Findings are worrisome for dural venous sinus thrombosis. 2. Redemonstrated subarachnoid hemorrhage in the left temporal lobe. No contrast-enhancing lesions visualized. Electronically Signed   By: Lorenza Cambridge M.D.   On: 09/26/2023 13:46   DG Chest Portable 1 View Result Date: 09/26/2023 CLINICAL DATA:  Concern for pneumonia. Altered mental status. Started on Augmentin yesterday. EXAM: PORTABLE CHEST 1 VIEW COMPARISON:  None Available. FINDINGS: Cardiac silhouette and mediastinal contours are within normal limits. Mild likely chronic bilateral lower lung interstitial thickening. No acute airspace opacity. No pleural effusion pneumothorax. Mild-to-moderate multilevel degenerative disc changes of the thoracic spine. IMPRESSION: Mild likely chronic bilateral lower lung interstitial thickening. No acute airspace opacity. Electronically Signed   By: Neita Garnet M.D.   On: 09/26/2023 12:13   CT Head Wo Contrast Result Date: 09/26/2023 CLINICAL DATA:  Delirium.  Sepsis.  No reported trauma. EXAM: CT HEAD WITHOUT CONTRAST TECHNIQUE: Contiguous axial images were obtained from the base of the skull through the vertex without intravenous contrast. RADIATION  DOSE REDUCTION: This exam was performed according to the departmental dose-optimization program which includes automated exposure control, adjustment of the mA and/or kV according to patient size and/or use of iterative reconstruction technique. COMPARISON:  None Available. FINDINGS: Brain: There are several clustered subcentimeter hyperdense foci in the left temporal lobe compatible with acute hemorrhage. No significant associated edema or acute large territory infarct is evident. There is no midline shift or extra-axial fluid collection. Cerebral volume is within normal limits for age with normal size of the ventricles. Vascular: Calcified atherosclerosis at the skull base. Asymmetry of the transverse and sigmoid sinuses with a more hypodense appearance on the left. Skull: No acute fracture or suspicious osseous lesion. Sinuses/Orbits: Mild mucosal thickening in the included portions of the maxillary sinuses. Clear mastoid air cells. Unremarkable orbits. Other: None. Critical Value/emergent results were called by telephone at the time of interpretation on 09/26/2023 at 10:53 am to Dr. Glendora Score, who verbally acknowledged these results. IMPRESSION: Small foci of acute hemorrhage in the left temporal lobe. A brain MRI without and with contrast is recommended to further evaluate for potential ischemia (including hemorrhagic venous infarct), infection (including septic emboli), and neoplasm among other causes. Electronically Signed   By: Sebastian Ache M.D.   On: 09/26/2023 11:23       HISTORY OF PRESENT ILLNESS 68 y.o. patient with history of hypertension and hyperlipidemia with recent sinus infection was admitted with Avril days of speech abnormalities.  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  Further CT revealed progression of IPH, and  patient was admitted to the ICU for close monitoring.  He was initially anticoagulated with heparin and then  transitioned to Eliquis.  CT chest abdomen and pelvis revealed no occult malignancy, so it is possible that CVST is related to patient's recent sinus infection.  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 and sigmoid sinuses is extending into left upper IJ, progression of patchy left temporal hematoma MRI asymmetrically decreased flow void in left transverse and sigmoid sinus with filling defects in the left sigmoid sinus and proximal IJ, redemonstrated subarachnoid hemorrhage and left temporal lobe Repeat head CT 2/14 unchanged left temporal hematoma Repeat head CT 2/16 stable left temporal hematoma with increased surrounding edema EEG 2/16 no seizures or epileptiform discharges 2D Echo EF 65 to 70% Pan CT no malignancy LDL 54 HgbA1c 12.8 Hypercoagulable work up negative so far VTE prophylaxis - fully anticoagulated with Eliquis No antithrombotic prior to admission, now on Eliquis (apixaban) daily  Therapy recommendations: CIR Disposition: CIR today   Hypertension Home meds: Amlodipine 10 mg daily, lisinopril 20 mg twice daily Stable but fluctuate BP goal less than 160 Long-term goal normotensive   Hyperlipidemia Home meds: Rosuvastatin 10 mg daily LDL 54, goal < 70 Resumed Crestor 10 Continue statin at discharge   Diabetes type II Uncontrolled Home meds: Farxiga 10 mg daily, Rybelsus 7 mg daily HgbA1c 12.4, goal < 7.0 Semglee 18 units daily And insulin aspart 3 units with meals CBGs [facility] Diabetes coordinator consult - will need insulin at home - wife and son educated on insulin injection   Recommend close follow-up with PCP for better DM control   Other Stroke Risk Factors Advanced age   Other Active Problems Recent sinusitis-continue Augmentin   DISCHARGE EXAM  PHYSICAL EXAM General:  Alert, well-nourished, well-developed patient in no acute distress Psych:  Mood and affect appropriate for situation CV: Regular rate and rhythm on monitor Respiratory:  Regular, unlabored respirations on room air GI: Abdomen soft and nontender  NEURO:  Mental Status:Patient is alert and responds to voice.  He is able to say more words  today and is trying to put sentences together, however speech is nonsensical and still mostly responds with the answer "2".  He does not answer any questions appropriately and cannot comprehend what is being said to him.  He can mimic   Cranial Nerves:  II: PERRL. Visual fields full.  III, IV, VI: EOMI. Eyelids elevate symmetrically.  V: Sensation is intact to light touch and symmetrical to face.  VII: Smile is symmetrical.  VIII: hearing intact to voice. IX, X: Palate elevates symmetrically. Phonation is normal.  ZO:[id number] shrug 5/5. XII: tongue is midline without fasciculations. Motor: 5/5 strength to all muscle groups tested.  Tone: is normal and bulk is normal Sensation- Intact to light touch bilaterally. Extinction absent to light touch to DSS. Coordination: FTN intact bilaterally, HKS: no ataxia in BLE.No drift.  Gait- deferred  1a Level of Conscious.: 0 1b LOC Questions: 2 1c LOC Commands: 2 2 Best Gaze: 0 3 Visual: 0 4 Facial Palsy: 0 5a Motor Arm - left: 0 5b Motor Arm - Right: 0 6a Motor Leg - Left: 0 6b Motor Leg - Right: 0 7 Limb Ataxia: 0 8 Sensory: 0 9 Best Language: 2 10 Dysarthria: 0 11 Extinct. and Inatten.: 0 TOTAL: 6   Discharge Diet  Diet   Diet regular Fluid consistency: Thin   liquids  DISCHARGE PLAN Disposition: Rehab Eliquis (apixaban) daily for secondary stroke prevention  Ongoing stroke risk  factor control by Primary Care Physician at time of discharge Follow-up PCP Benita Stabile, MD in 2 weeks. Follow-up in [facility] in 4-8 weeks, office to schedule an appointment.   35 minutes were spent preparing discharge.   Gevena Mart DNP, ACNPC-AG  [facility]  ATTENDING NOTE: I reviewed above note and agree with the assessment and plan. Pt was seen and examined.   No acute event overnight.  Neuro unchanged.  On Eliquis and statin.  PT and OT recommend CIR.  Medically ready for CIR placement.  Follow-up at the [facility] in 4 to 8 weeks.  For detailed assessment and plan, please refer to above/below as I have made changes wherever appropriate.   Marvel Plan, MD PhD Stroke Neurology 10/01/2023 5:49 PM

## 2023-09-28 NOTE — Evaluation (Signed)
 Physical Therapy Evaluation Patient Details Name: Howard Johnson MRN: [id number] DOB: 04/13/1956 Today's Date: 09/28/2023  History of Present Illness  68 yo male recently dx with sinusitis from home with AMS. 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 CT venogram (+) temporal lobe hematoma. PMH Dm2, HTn HLD CKDIII  Clinical Impression  Pt presents to PT with deficits in communication. 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 Due to these communication deficits it is difficult to assess dynamic gait and balance at this time. Pt does ambulate for household distances without physical assistance and does not appear to have significant balance deficits when doing so, however further attempts at higher level gait and balance assessment may be warranted to ensure no deficits are present.         If plan is discharge home, recommend the following: A little help with bathing/dressing/bathroom;Direct supervision/assist for medications management;Direct supervision/assist for financial management;Supervision due to cognitive status   Can travel by private vehicle        Equipment Recommendations None recommended by PT  Recommendations for Other Services       Functional Status Assessment Patient has had a recent decline in their functional status and demonstrates the ability to make significant improvements in function in a reasonable and predictable amount of time.     Precautions / Restrictions Precautions Precautions: Other (comment) Precaution/Restrictions Comments: expressive and receptive aphasia Restrictions Weight Bearing Restrictions Per Provider Order: No      Mobility  Bed Mobility Overal bed mobility: Needs Assistance Bed Mobility: Supine to Sit     Supine to sit: Min assist     General bed mobility comments: minA to initiate mobility due to impaired command  following    Transfers Overall transfer level: Needs assistance Equipment used: None Transfers: Sit to/from Stand Sit to Stand: Supervision                Ambulation/Gait Ambulation/Gait assistance: Supervision Gait Distance (Feet): 400 Feet Assistive device: None Gait Pattern/deviations: Step-through pattern Gait velocity: functional Gait velocity interpretation: >2.62 ft/sec, indicative of community ambulatory   General Gait Details: steady step-through gait  Stairs            Wheelchair Mobility     Tilt Bed    Modified Rankin (Stroke Patients Only) Modified Rankin (Stroke Patients Only) Pre-Morbid Rankin Score: No symptoms Modified Rankin: Moderate disability     Balance Overall balance assessment: No apparent balance deficits (not formally assessed)                                           Pertinent Vitals/Pain Pain Assessment Pain Assessment: Faces Faces Pain Scale: No hurt    Home Living Family/patient expects to be discharged to:: Private residence Living Arrangements: Spouse/significant other;Children Available Help at Discharge: Family Type of Home: House Home Access: Stairs to enter   Secretary/administrator of Steps: 2   Home Layout: One level Home Equipment: None Additional Comments: sister present but unable to state whether pt has DME at home as she lives out of town.    Prior Function Prior Level of Function : Independent/Modified Independent;Working/employed;Driving                     Extremity/Trunk Assessment   Upper Extremity Assessment Upper Extremity Assessment: Defer to  OT evaluation    Lower Extremity Assessment Lower Extremity Assessment: Overall WFL for tasks assessed    Cervical / Trunk Assessment Cervical / Trunk Assessment: Normal  Communication   Communication Communication: Impaired Factors Affecting Communication: Difficulty expressing self (expressive and receptive  deficits)    Cognition Arousal: Alert Behavior During Therapy: WFL for tasks assessed/performed   PT - Cognitive impairments: Difficult to assess Difficult to assess due to: Impaired communication                       Following commands: Impaired Following commands impaired: Follows one step commands inconsistently (pt appears to follow more consistently with visual and tactile cueing)     Cueing Cueing Techniques: Verbal cues, Gestural cues, Tactile cues, Visual cues     General Comments General comments (skin integrity, edema, etc.): VSS on RA    Exercises     Assessment/Plan    PT Assessment Patient needs continued PT services  PT Problem List Decreased balance;Decreased mobility       PT Treatment Interventions Gait training;Stair training;Functional mobility training;Balance training;Neuromuscular re-education    PT Goals (Current goals can be found in the Care Plan section)  Acute Rehab PT Goals Patient Stated Goal: to return to independence PT Goal Formulation: With patient Time For Goal Achievement: 10/12/23 Potential to Achieve Goals: Fair Additional Goals Additional Goal #1: Pt will score >19/24 on the DGI to indicate a reduced risk for falls Additional Goal #2: Pt will score >45/56 on the BERG to indicate a reduced risk for falls    Frequency Min 1X/week     Co-evaluation               AM-PAC PT "6 Clicks" Mobility  Outcome Measure Help needed turning from your back to your side while in a flat bed without using bedrails?: A Little Help needed moving from lying on your back to sitting on the side of a flat bed without using bedrails?: A Little Help needed moving to and from a bed to a chair (including a wheelchair)?: A Little Help needed standing up from a chair using your arms (e.g., wheelchair or bedside chair)?: A Little Help needed to walk in hospital room?: A Little Help needed climbing 3-5 steps with a railing? : A Little 6 Click  Score: 18    End of Session Equipment Utilized During Treatment: Gait belt Activity Tolerance: Patient tolerated treatment well Patient left: in chair;with call bell/phone within reach;with chair alarm set;with family/visitor present Nurse Communication: Mobility status PT Visit Diagnosis: Other abnormalities of gait and mobility (R26.89);Other symptoms and signs involving the nervous system (R29.898)    Time: 1610-9604 PT Time Calculation (min) (ACUTE ONLY): 31 min   Charges:   PT Evaluation $PT Eval Low Complexity: 1 Low   PT General Charges $$ ACUTE PT VISIT: 1 Visit         Arlyss Gandy, PT, DPT Acute Rehabilitation Office [phone]   Arlyss Gandy 09/28/2023, 11:56 AM

## 2023-09-28 NOTE — Progress Notes (Addendum)
 PHARMACY - ANTICOAGULATION CONSULT NOTE  Pharmacy Consult for heparin>eliquis Indication: venous sinus thrombosis  Allergies  Allergen Reactions   Tape Rash    Patient Measurements: Height: 6\' 4"  (193 cm) Weight: 92.2 kg (203 lb 4.2 oz) IBW/kg (Calculated) : 86.8 Heparin Dosing Weight: TBW  Vital Signs: Temp: 98.3 F (36.8 C) (02/15 0800) Temp Source: Oral (02/15 0800) BP: 101/64 (02/15 0800) Pulse Rate: 83 (02/15 0800)  Labs: Recent Labs    09/26/23 1030 09/26/23 1030 09/27/23 0530 09/27/23 1302 09/27/23 2142 09/28/23 0524 09/28/23 0730 09/28/23 1131  HGB 17.9*  --  17.1*  --   --  15.1  --   --   HCT 52.7*  --  50.1  --   --  45.1  --   --   PLT 286  --  270  --   --  210  --   --   HEPARINUNFRC  --    < > <0.10* 0.29* 0.75*  --  0.84* <0.10*  CREATININE 1.68*  --  1.70* 1.77*  --   --   --   --    < > = values in this interval not displayed.    Estimated Creatinine Clearance: 48.4 mL/min (A) (by C-G formula based on SCr of 1.77 mg/dL (H)).   Medical History: Past Medical History:  Diagnosis Date   Diabetes mellitus without complication (HCC)    Hyperlipidemia    Hypertension     Assessment: 89 YOM presenting with AMS, CT venogram with dural venous sinus thrombosis (occlusive) with [facility], pharmacy consulted for conservative heparin dosing, will target low end goals and no bolus.  Pharmacy consulted to transition to Eliquis for CVST Per neuro ensure hypercoagulable workup labs are drawn prior to first dose of eliquis.   Goal of Therapy:  Monitor platelets by anticoagulation protocol: Yes   Plan:  Heparin stopped per neuro - HL < 0.10 ensures heparin clearance prior to hypercoag lab draws as above (labs drawn at 0921) Eliquis 5 mg BID - will omit load d/t concurrent SAH  F/u s/sx bleeding, CBC  Copay $47/mo - appreciate TOC assistance  Calton Dach, PharmD, BCCCP Clinical Pharmacist 09/28/2023 12:14 PM

## 2023-09-28 NOTE — Progress Notes (Signed)
 PHARMACY - ANTICOAGULATION CONSULT NOTE  Pharmacy Consult for heparin Indication: venous sinus thrombosis  Allergies  Allergen Reactions   Tape Rash    Patient Measurements: Height: 6\' 4"  (193 cm) Weight: 92.2 kg (203 lb 4.2 oz) IBW/kg (Calculated) : 86.8 Heparin Dosing Weight: TBW  Vital Signs: Temp: 98.3 F (36.8 C) (02/15 0800) Temp Source: Oral (02/15 0800) BP: 101/64 (02/15 0800) Pulse Rate: 83 (02/15 0800)  Labs: Recent Labs    09/26/23 1030 09/26/23 1030 09/27/23 0530 09/27/23 1302 09/27/23 2142 09/28/23 0524 09/28/23 0730  HGB 17.9*  --  17.1*  --   --  15.1  --   HCT 52.7*  --  50.1  --   --  45.1  --   PLT 286  --  270  --   --  210  --   HEPARINUNFRC  --    < > <0.10* 0.29* 0.75*  --  0.84*  CREATININE 1.68*  --  1.70* 1.77*  --   --   --    < > = values in this interval not displayed.    Estimated Creatinine Clearance: 48.4 mL/min (A) (by C-G formula based on SCr of 1.77 mg/dL (H)).   Medical History: Past Medical History:  Diagnosis Date   Diabetes mellitus without complication (HCC)    Hyperlipidemia    Hypertension     Assessment: 25 YOM presenting with AMS, CT venogram with dural venous sinus thrombosis (occlusive)/SAH, pharmacy consulted for conservative heparin dosing, will target low end goals and no bolus.  Heparin level now supratherapeutic  (0.84) on infusion at 1350 units/hr. No issues with line or bleeding reported per RN.  Goal of Therapy:  Heparin level 0.3-0.5 units/ml Monitor platelets by anticoagulation protocol: Yes   Plan:  Pause heparin infusion x30 mins then decrease heparin to 1100 units/hr F/u 8 hr heparin level F/u daily HL, s/sx bleeding  Calton Dach, PharmD, BCCCP Clinical Pharmacist 09/28/2023 8:36 AM

## 2023-09-28 NOTE — Evaluation (Signed)
 Occupational Therapy Evaluation Patient Details Name: Howard Johnson MRN: [id number] DOB: 26-Aug-1955 Today's Date: 09/28/2023   History of Present Illness   68 yo male recently dx with sinusitis from home with AMS. 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 CT venogram (+) temporal lobe hematoma. PMH Dm2, HTn HLD CKDIII     Clinical Impressions PTA, pt lived with wife and son and was independent for ADL, IADL, working, and driving. Upon eval, pt predominantly limited by expressive and receptive language difficulty with frequent attempts at use of yes/no but only stating "yes", and occasionally successfully verbalizing predominantly when mildly frustrated (wanting to take contacts out). Pt needing up to min A for ADL secondary to need for multimodal cues to initiate tasks this session. Questionable decreased attention and executive function. Overall otherwise moving at CGA-supervision level. Will follow acutely to continue cognitive assessment.      If plan is discharge home, recommend the following:   A little help with walking and/or transfers;A little help with bathing/dressing/bathroom;Assistance with cooking/housework;Direct supervision/assist for medications management;Direct supervision/assist for financial management;Assist for transportation;Help with stairs or ramp for entrance     Functional Status Assessment   Patient has had a recent decline in their functional status and demonstrates the ability to make significant improvements in function in a reasonable and predictable amount of time.     Equipment Recommendations   None recommended by OT     Recommendations for Other Services         Precautions/Restrictions         Mobility Bed Mobility Overal bed mobility: Needs Assistance Bed Mobility: Supine to Sit     Supine to sit: Min assist     General bed mobility comments: for multimodal cues for command following     Transfers Overall transfer level: Needs assistance   Transfers: Sit to/from Stand Sit to Stand: Supervision           General transfer comment: for safety with initial transfer      Balance Overall balance assessment: Mild deficits observed, not formally tested                                         ADL either performed or assessed with clinical judgement   ADL Overall ADL's : Needs assistance/impaired     Grooming: Minimal assistance;Bed level Grooming Details (indicate cue type and reason): removed contacts. Min A for OT managing contact case and aunt filling with solution.             Lower Body Dressing: Cueing for sequencing;Sitting/lateral leans;Minimal assistance Lower Body Dressing Details (indicate cue type and reason): set up after provided mod multimodal cues to overcome difficulty with language. once pt aware of task OT wants pt to perform pt doing so without difficulty             Functional mobility during ADLs: Supervision/safety;Contact guard assist       Vision Baseline Vision/History: 1 Wears glasses (contacts donned on arrival but bothering pt and pt took them out) Ability to See in Adequate Light: 1 Impaired Patient Visual Report: Other (comment) (pt with difficulty reporting) Additional Comments: pt doffed contacts during beginning of session as he has not changed them since being here and bothering him. Pt constantly reports I can read when asked if vision has changed. potential peripheral impairments secondary to not recognizing gestural  cues unless therapist in front of him. Pt able to identify how many fingers therapist was holding up with multimodal cues to mirror with his own hands rather than trying to verbally report. Of note able to verbally report when counting aloud on his fingers as well "1, 2, 3"     Perception         Praxis         Pertinent Vitals/Pain Pain Assessment Pain Assessment: Faces Faces  Pain Scale: Hurts little more Pain Location: eyes Pain Descriptors / Indicators: Burning Pain Intervention(s): Other (comment) (pt removed contacts and put them in his case with solution.)     Extremity/Trunk Assessment Upper Extremity Assessment Upper Extremity Assessment: Overall WFL for tasks assessed (able to manage contacts with incresaed time, don/doff socks. Gross strength WFL)   Lower Extremity Assessment Lower Extremity Assessment: Defer to PT evaluation       Communication Communication Communication: Impaired Factors Affecting Communication: Difficulty expressing self (difficulty receptively and expressively.)   Cognition Arousal: Alert Behavior During Therapy: WFL for tasks assessed/performed Cognition: Cognition impaired     Awareness: Intellectual awareness intact   Attention impairment (select first level of impairment): Selective attention (could attend to one step full ADL tasks but decreased overall attention to environment)   OT - Cognition Comments: Cognition difficult to assess secondary to receptive and expressive language deficits.                 Following commands: Impaired Following commands impaired: Follows one step commands inconsistently, Follows one step commands with increased time (multimodal cues to follow commands as pt with receptive dififculties. pt reporting he can read but given written instruction was unable to follow)     Cueing  General Comments   Cueing Techniques: Verbal cues;Gestural cues;Tactile cues;Visual cues      Exercises     Shoulder Instructions      Home Living Family/patient expects to be discharged to:: Private residence Living Arrangements: Spouse/significant other;Children Available Help at Discharge: Family Type of Home: House Home Access: Stairs to enter Secretary/administrator of Steps: 2   Home Layout: One level     Bathroom Shower/Tub: Walk-in shower (need to double check for accuracy)    Bathroom Toilet: Standard         Additional Comments: sister present but unable to state whether pt has DME at home as she lives out of town.  Lives With: Spouse    Prior Functioning/Environment Prior Level of Function : Independent/Modified Independent;Working/employed;Driving                    OT Problem List: Impaired balance (sitting and/or standing);Decreased cognition (decr communication)   OT Treatment/Interventions: Self-care/ADL training;Therapeutic exercise;DME and/or AE instruction;Balance training;Patient/family education;Therapeutic activities;Cognitive remediation/compensation      OT Goals(Current goals can be found in the care plan section)   Acute Rehab OT Goals Patient Stated Goal: remove contacts OT Goal Formulation: With patient Time For Goal Achievement: 10/12/23 Potential to Achieve Goals: Good   OT Frequency:  Min 1X/week    Co-evaluation              AM-PAC OT "6 Clicks" Daily Activity     Outcome Measure Help from another person eating meals?: None Help from another person taking care of personal grooming?: A Little Help from another person toileting, which includes using toliet, bedpan, or urinal?: A Little Help from another person bathing (including washing, rinsing, drying)?: A Little Help from another person to put  on and taking off regular upper body clothing?: A Little Help from another person to put on and taking off regular lower body clothing?: A Little 6 Click Score: 19   End of Session Equipment Utilized During Treatment: Gait belt Nurse Communication: Mobility status  Activity Tolerance: Patient tolerated treatment well Patient left: in chair;with call bell/phone within reach;with chair alarm set;with family/visitor present  OT Visit Diagnosis: Cognitive communication deficit (R41.841);Other symptoms and signs involving cognitive function                Time: 9629-5284 OT Time Calculation (min): 33 min Charges:   OT General Charges $OT Visit: 1 Visit OT Evaluation $OT Eval Moderate Complexity: 1 Mod  Tyler Deis, OTR/L [facility] Acute Rehabilitation Office: [phone]   Myrla Halsted 09/28/2023, 11:18 AM

## 2023-09-28 NOTE — Inpatient Diabetes Management (Signed)
 Inpatient Diabetes Program Recommendations  AACE/ADA: New Consensus Statement on Inpatient Glycemic Control (2015)  Target Ranges:  Prepandial:   less than 140 mg/dL      Peak postprandial:   less than 180 mg/dL (1-2 hours)      Critically ill patients:  140 - 180 mg/dL   Lab Results  Component Value Date   GLUCAP 258 (H) 09/28/2023   HGBA1C 12.8 (H) 11/23/2020    Review of Glycemic Control  Latest Reference Range & Units 09/26/23 10:16 09/26/23 17:46 09/26/23 21:25 09/27/23 07:54 09/27/23 11:54 09/27/23 16:45 09/27/23 21:20 09/28/23 07:42  Glucose-Capillary 70 - 99 mg/dL 161 (H) 096 (H) 045 (H) 258 (H) 235 (H) 287 (H) 318 (H) 258 (H)  (H): Data is abnormally high  Diabetes history: DM2 Outpatient Diabetes medications: Farxiga 10 mg daily Current orders for Inpatient glycemic control: Novolog 0-9 units correction scale TID, Novolog 0-5 units HS scale    Inpatient Diabetes Program Recommendations:    Semglee 15 units every day.  Will continue to follow while inpatient.  Thank you, Dulce Sellar, MSN, CDCES Diabetes Coordinator Inpatient Diabetes Program [phone] (team pager from 8a-5p)

## 2023-09-28 NOTE — Progress Notes (Signed)
 Dr. Amada Jupiter paged. Patient's SBP is above goal of 160. RN requested PRN BP medication, as none were ordered, before initiating cleviprex infusion. Dr. Amada Jupiter ordered IV labetalol.

## 2023-09-28 NOTE — Discharge Instructions (Signed)

## 2023-09-28 NOTE — Progress Notes (Addendum)
 STROKE TEAM PROGRESS NOTE   INTERIM HISTORY/SUBJECTIVE Patient is seen in his room with no family at the bedside.  He has remained hemodynamically stable and afebrile and is ready to transfer out of the ICU today.  Will transition heparin to p.o. Eliquis.  Given drowsiness on exam, will reduce as needed Seroquel dose to 12.5 mg. OBJECTIVE  CBC    Component Value Date/Time   WBC 11.2 (H) 09/28/2023 0524   RBC 5.58 09/28/2023 0524   HGB 15.1 09/28/2023 0524   HGB 16.6 11/23/2020 1553   HCT 45.1 09/28/2023 0524   HCT 50.0 11/23/2020 1553   PLT 210 09/28/2023 0524   PLT 264 11/23/2020 1553   MCV 80.8 09/28/2023 0524   MCV 84 11/23/2020 1553   MCH 27.1 09/28/2023 0524   MCHC 33.5 09/28/2023 0524   RDW 14.2 09/28/2023 0524   RDW 13.5 11/23/2020 1553   LYMPHSABS 0.4 (L) 09/26/2023 1030   MONOABS 0.8 09/26/2023 1030   EOSABS 0.0 09/26/2023 1030   BASOSABS 0.1 09/26/2023 1030    BMET    Component Value Date/Time   NA 134 (L) 09/27/2023 1302   NA 139 11/23/2020 1553   K 3.6 09/27/2023 1302   CL 101 09/27/2023 1302   CO2 20 (L) 09/27/2023 1302   GLUCOSE 325 (H) 09/27/2023 1302   BUN 29 (H) 09/27/2023 1302   BUN 21 11/23/2020 1553   CREATININE 1.77 (H) 09/27/2023 1302   CALCIUM 9.0 09/27/2023 1302   EGFR 55 (L) 11/23/2020 1553   GFRNONAA 41 (L) 09/27/2023 1302    IMAGING past 24 hours No results found.   Vitals:   09/28/23 0900 09/28/23 1000 09/28/23 1100 09/28/23 1200  BP: (!) 105/51  (!) 141/88 (!) 143/88  Pulse: 67  90 91  Resp: 17 16 16 20   Temp:    98 F (36.7 C)  TempSrc:    Oral  SpO2: (!) 89%  96% 95%  Weight:      Height:         PHYSICAL EXAM General:  Alert, well-nourished, well-developed patient in no acute distress Psych:  Mood and affect appropriate for situation CV: Regular rate and rhythm on monitor Respiratory:  Regular, unlabored respirations on supplemental O2 GI: Abdomen soft and nontender   NEURO:  Mental Status: Patient is drowsy but  responds to name.  He is unable to state place, time or situation. He cannot follow commands but will mimic Speech/Language: speech is with receptive and expressive aphasia but no dysarthria.  Social speech is intact, but patient is unable to answer questions or follow commands  Cranial Nerves:  II: PERRL.  III, IV, VI: EOMI. Eyelids elevate symmetrically.  V: Sensation is intact to light touch and symmetrical to face.  VII: Face is symmetrical resting and smiling VIII: hearing intact to voice. IX, X: Phonation is normal.  XII: tongue is midline without fasciculations. Motor: Able to move all 4 extremities with good antigravity strength Tone: is normal and bulk is normal Sensation- Intact to light touch bilaterally.  Coordination: Unable to perform Gait- deferred  Most Recent NIH 6   ASSESSMENT/PLAN  [name] is a 68 y.o. male with history of hypertension and hyperlipidemia admitted for several days of speech abnormalities.  He was found to have a small left temporal lobe hemorrhage as well as CVST through the left transverse and sigmoid sinuses on CT venogram.  Progression of IPH was seen, and patient was admitted to ICU for close monitoring.  He is anticoagulated with heparin.  CT chest abdomen and pelvis reveals no occult malignancy. 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  Recent flulike illness and sinus infection ?  Related CT head small foci of acute hemorrhage in left temporal lobe CTA head & neck no emergent arterial finding or lesion underlying IPH CT venogram dural venous sinus thrombosis occlusive to nearly occlusive throughout left transverse and sigmoid sinuses is extending into left upper IJ, progression of patchy left temporal hematoma Repeat head CT 2/14 unchanged left temporal hematoma MRI asymmetrically decreased flow void in left  transverse and sigmoid sinus with filling defects in the left sigmoid sinus and proximal IJ, redemonstrated subarachnoid hemorrhage and left temporal lobe 2D Echo EF 65 to 70%, grade 1 diastolic dysfunction, normal left atrial size, no atrial level shunt LDL 54 HgbA1c 12.8 VTE prophylaxis -fully anticoagulated with heparin No antithrombotic prior to admission, now on Eliquis (apixaban) daily  Therapy recommendations:  CIR Disposition: Pending  Hypertension Home meds: Amlodipine 10 mg daily, lisinopril 20 mg twice daily Stable Blood Pressure Goal: SBP between 130-150 for 24 hours and then less than 160   Hyperlipidemia Home meds: Rosuvastatin 10 mg daily LDL 54, goal < 70 Resume statin at discharge  Diabetes type II Uncontrolled Home meds: Farxiga 10 mg daily, Rybelsus 7 mg daily HgbA1c 12.8, goal < 7.0 Semglee 15 units daily CBGs SSI Diabetes coordinator consult Recommend close follow-up with PCP for better DM control  Dysphagia Patient has post-stroke dysphagia, SLP consulted    Diet   DIET DYS 2 Room service appropriate? Yes with Assist; Fluid consistency: Thin   Advance diet as tolerated  Other Stroke Risk Factors None   Other Active Problems Sinusitis-continue Augmentin  Hospital day # 2  Patient seen by NP with MD, MD to edit note as needed. Howard Johnson , MSN, AGACNP-BC [facility] See Amion for schedule and pager information 09/28/2023 1:45 PM   I have personally obtained history,examined this patient, reviewed notes, independently viewed imaging studies, participated in medical decision making and plan of care.ROS completed by me personally and pertinent positives fully documented  I have made any additions or clarifications directly to the above note. Agree with note above.  Patient remains aphasic but is more alert and interactive especially in the presence of his family.  Blood pressure adequately controlled.  Plan to transition from  IV heparin to Eliquis today since she is able to swallow.  Continue IV hydration.  CT scan chest abdomen pelvis is negative for malignancy.  Hypercoagulable panel labs will be sent today.  Long discussion with patient's wife, son and sister at the bedside and answered questions.  Transfer to neurology floor bed and continue therapy evaluation.  Greater than 50% time during this 50-minute visit was spent in counseling and coordination of care about his venous anastomosis, intracerebral hemorrhage and aphasia discussion with patient and family and answering questions.  Delia Heady, MD Medical Director [facility] Pager: [phone] 09/28/2023 4:07 PM    To contact Stroke Continuity provider, please refer to [email or web address]. After hours, contact General Neurology

## 2023-09-29 ENCOUNTER — Inpatient Hospital Stay (HOSPITAL_COMMUNITY): Payer: HMO

## 2023-09-29 DIAGNOSIS — I629 Nontraumatic intracranial hemorrhage, unspecified: Secondary | ICD-10-CM

## 2023-09-29 DIAGNOSIS — I609 Nontraumatic subarachnoid hemorrhage, unspecified: Secondary | ICD-10-CM

## 2023-09-29 DIAGNOSIS — G08 Intracranial and intraspinal phlebitis and thrombophlebitis: Secondary | ICD-10-CM | POA: Diagnosis not present

## 2023-09-29 DIAGNOSIS — R4182 Altered mental status, unspecified: Secondary | ICD-10-CM

## 2023-09-29 LAB — CBC
HCT: 44.2 % (ref 39.0–52.0)
Hemoglobin: 15.1 g/dL (ref 13.0–17.0)
MCH: 27.7 pg (ref 26.0–34.0)
MCHC: 34.2 g/dL (ref 30.0–36.0)
MCV: 81 fL (ref 80.0–100.0)
Platelets: 241 10*3/uL (ref 150–400)
RBC: 5.46 MIL/uL (ref 4.22–5.81)
RDW: 14.4 % (ref 11.5–15.5)
WBC: 11.8 10*3/uL — ABNORMAL HIGH (ref 4.0–10.5)
nRBC: 0 % (ref 0.0–0.2)

## 2023-09-29 LAB — BASIC METABOLIC PANEL
Anion gap: 11 (ref 5–15)
BUN: 25 mg/dL — ABNORMAL HIGH (ref 8–23)
CO2: 20 mmol/L — ABNORMAL LOW (ref 22–32)
Calcium: 8.7 mg/dL — ABNORMAL LOW (ref 8.9–10.3)
Chloride: 106 mmol/L (ref 98–111)
Creatinine, Ser: 1.49 mg/dL — ABNORMAL HIGH (ref 0.61–1.24)
GFR, Estimated: 50 mL/min — ABNORMAL LOW (ref >60)
Glucose, Bld: 194 mg/dL — ABNORMAL HIGH (ref 70–99)
Potassium: 3.4 mmol/L — ABNORMAL LOW (ref 3.5–5.1)
Sodium: 137 mmol/L (ref 135–145)

## 2023-09-29 LAB — BETA-2-GLYCOPROTEIN I ABS, IGG/M/A
Beta-2 Glyco I IgG: <9 GPI IgG units (ref 0–20)
Beta-2-Glycoprotein I IgA: <9 GPI IgA units (ref 0–25)
Beta-2-Glycoprotein I IgM: <9 GPI IgM units (ref 0–32)

## 2023-09-29 LAB — GLUCOSE, CAPILLARY
Glucose-Capillary: 195 mg/dL — ABNORMAL HIGH (ref 70–99)
Glucose-Capillary: 269 mg/dL — ABNORMAL HIGH (ref 70–99)
Glucose-Capillary: 300 mg/dL — ABNORMAL HIGH (ref 70–99)
Glucose-Capillary: 179 mg/dL — ABNORMAL HIGH (ref 70–99)

## 2023-09-29 MED ORDER — INSULIN ASPART 100 UNIT/ML IJ SOLN
3.0000 [IU] | Freq: Three times a day (TID) | INTRAMUSCULAR | Status: DC
  Administered 2023-09-29 – 2023-10-01 (×5): 3 [IU] via SUBCUTANEOUS

## 2023-09-29 MED ORDER — INSULIN GLARGINE-YFGN 100 UNIT/ML ~~LOC~~ SOLN
18.0000 [IU] | Freq: Every day | SUBCUTANEOUS | Status: DC
  Administered 2023-09-30 – 2023-10-01 (×2): 18 [IU] via SUBCUTANEOUS
  Filled 2023-09-29 (×2): qty 0.18

## 2023-09-29 NOTE — Plan of Care (Signed)
   Problem: Health Behavior/Discharge Planning: Goal: Ability to manage health-related needs will improve Outcome: Progressing   Problem: Clinical Measurements: Goal: Ability to maintain clinical measurements within normal limits will improve Outcome: Progressing

## 2023-09-29 NOTE — Progress Notes (Signed)
 EEG complete - results pending

## 2023-09-29 NOTE — Progress Notes (Signed)
 Nurse requested tech try back on EEG once patient has returned from CT.

## 2023-09-29 NOTE — Plan of Care (Signed)
 New transfer from the ICU for ICH. Patient noted to have expressive and receptive aphasia. Unable assess mentation secondary to confusion. Cardiac monitor, RA, skin intact, condom cath.   Problem: Health Behavior/Discharge Planning: Goal: Ability to manage health-related needs will improve Outcome: Not Met (add Reason)   Problem: Pain Managment: Goal: General experience of comfort will improve and/or be controlled Outcome: Not Met (add Reason)   Problem: Skin Integrity: Goal: Risk for impaired skin integrity will decrease Outcome: Not Met (add Reason)   Problem: Safety: Goal: Ability to remain free from injury will improve Outcome: Not Met (add Reason)    Problem: Intracerebral Hemorrhage Tissue Perfusion: Goal: Complications of Intracerebral Hemorrhage will be minimized Outcome: Not Met (add Reason)

## 2023-09-29 NOTE — Progress Notes (Signed)
 VASCULAR LAB    Carotid duplex has been performed.  See CV proc for preliminary results.   Azeneth Carbonell, RVT 09/29/2023, 3:48 PM

## 2023-09-29 NOTE — Procedures (Signed)
 Routine EEG Report  Howard Johnson is a 68 y.o. male with a history of altered mental status who is undergoing an EEG to evaluate for seizures.  Report: This EEG was acquired with electrodes placed according to the International 10-20 electrode system (including Fp1, Fp2, F3, F4, C3, C4, P3, P4, O1, O2, T3, T4, T5, T6, A1, A2, Fz, Cz, Pz). The following electrodes were missing or displaced: none.  The occipital dominant rhythm was 4-5 Hz. This activity is reactive to stimulation. Drowsiness was manifested by background fragmentation; deeper stages of sleep were identified by K complexes and sleep spindles. There was no focal slowing. There were no interictal epileptiform discharges. There were no electrographic seizures identified. Photic stimulation and hyperventilation were not performed.  Impression and clinical correlation: This EEG was obtained while awake and asleep and is abnormal due to moderate-to-severe diffuse slowing indicative of global cerebral dysfunction. Epileptiform abnormalities were not seen during this recording.  Bing Neighbors, MD [facility] [phone]  If 7pm- 7am, please page neurology on call as listed in AMION.

## 2023-09-29 NOTE — Progress Notes (Addendum)
 STROKE TEAM PROGRESS NOTE   INTERIM HISTORY/SUBJECTIVE Patient is seen in his room with 1 family member at the bedside.  He remains hemodynamically stable and afebrile.  His expressive aphasia has improved, and he is able to make full sentences but still has difficulty comprehending what is being said to him.  There is a question of some diplopia or possible hallucinations.  EEG was negative for seizure activity, and repeat head CT shows stable size of ICH with increased surrounding edema.   OBJECTIVE  CBC    Component Value Date/Time   WBC 11.8 (H) 09/29/2023 0351   RBC 5.46 09/29/2023 0351   HGB 15.1 09/29/2023 0351   HGB 16.6 11/23/2020 1553   HCT 44.2 09/29/2023 0351   HCT 50.0 11/23/2020 1553   PLT 241 09/29/2023 0351   PLT 264 11/23/2020 1553   MCV 81.0 09/29/2023 0351   MCV 84 11/23/2020 1553   MCH 27.7 09/29/2023 0351   MCHC 34.2 09/29/2023 0351   RDW 14.4 09/29/2023 0351   RDW 13.5 11/23/2020 1553   LYMPHSABS 0.4 (L) 09/26/2023 1030   MONOABS 0.8 09/26/2023 1030   EOSABS 0.0 09/26/2023 1030   BASOSABS 0.1 09/26/2023 1030    BMET    Component Value Date/Time   NA 137 09/29/2023 0351   NA 139 11/23/2020 1553   K 3.4 (L) 09/29/2023 0351   CL 106 09/29/2023 0351   CO2 20 (L) 09/29/2023 0351   GLUCOSE 194 (H) 09/29/2023 0351   BUN 25 (H) 09/29/2023 0351   BUN 21 11/23/2020 1553   CREATININE 1.49 (H) 09/29/2023 0351   CALCIUM 8.7 (L) 09/29/2023 0351   EGFR 55 (L) 11/23/2020 1553   GFRNONAA 50 (L) 09/29/2023 0351    IMAGING past 24 hours EEG adult Result Date: 09/29/2023 Jefferson Fuel, MD     09/29/2023  1:11 PM Routine EEG Report Howard Johnson is a 68 y.o. male with a history of altered mental status who is undergoing an EEG to evaluate for seizures. Report: This EEG was acquired with electrodes placed according to the International 10-20 electrode system (including Fp1, Fp2, F3, F4, C3, C4, P3, P4, O1, O2, T3, T4, T5, T6, A1, A2, Fz, Cz, Pz). The following  electrodes were missing or displaced: none. The occipital dominant rhythm was 4-5 Hz. This activity is reactive to stimulation. Drowsiness was manifested by background fragmentation; deeper stages of sleep were identified by K complexes and sleep spindles. There was no focal slowing. There were no interictal epileptiform discharges. There were no electrographic seizures identified. Photic stimulation and hyperventilation were not performed. Impression and clinical correlation: This EEG was obtained while awake and asleep and is abnormal due to moderate-to-severe diffuse slowing indicative of global cerebral dysfunction. Epileptiform abnormalities were not seen during this recording. Bing Neighbors, MD [facility] [phone] If 7pm- 7am, please page neurology on call as listed in AMION.     Vitals:   09/29/23 1000 09/29/23 1100 09/29/23 1200 09/29/23 1218  BP: (!) 143/81 (!) 153/101 (!) 133/91   Pulse: 90 94 85   Resp: 15 16 13    Temp:    (P) 98.2 F (36.8 C)  TempSrc:    (P) Oral  SpO2: 95% 92% 93%   Weight:      Height:         PHYSICAL EXAM General:  Alert, well-nourished, well-developed patient in no acute distress Psych:  Mood and affect appropriate for situation CV: Regular rate and rhythm on monitor Respiratory:  Regular,  unlabored respirations on supplemental O2 GI: Abdomen soft and nontender   NEURO:  Mental Status: Patient is alert and responds to voice.  He is able to speak in full sentences and somewhat communicate what is going on with him.  However, he is unable to answer questions or follow commands (he will mimic though) and appears to have significant receptive aphasia.  Cranial Nerves:  II: PERRL.  III, IV, VI: EOMI. Eyelids elevate symmetrically.  V: Sensation is intact to light touch and symmetrical to face.  VII: Face is symmetrical resting and smiling VIII: hearing intact to voice. IX, X: Phonation is normal.  XII: tongue is midline without  fasciculations. Motor: Able to move all 4 extremities with good antigravity strength Tone: is normal and bulk is normal Sensation- Intact to light touch bilaterally.  Coordination: Unable to perform Gait- deferred  Most Recent NIH 6   ASSESSMENT/PLAN  [name] is a 68 y.o. male with history of hypertension and hyperlipidemia admitted for several days of speech abnormalities.  He was found to have a small left temporal lobe hemorrhage as well as CVST through the left transverse and sigmoid sinuses on CT venogram.  Progression of IPH was seen, and patient was admitted to ICU for close monitoring.  He is anticoagulated with heparin.  CT chest abdomen and pelvis reveals no occult malignancy. 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 is extending into left upper IJ, progression of patchy left temporal hematoma MRI asymmetrically decreased flow void in left transverse and sigmoid sinus with filling defects in the left sigmoid sinus and proximal IJ, redemonstrated subarachnoid hemorrhage and left temporal lobe Repeat head CT 2/14 unchanged left temporal hematoma Repeat head CT 2/16 stable left temporal hematoma with increased surrounding edema EEG 2/16 no seizures or epileptiform discharges 2D Echo EF 65 to 70% Pan CT no malignancy LDL 54 HgbA1c 12.8 Hypercoagulable work up pending VTE prophylaxis - fully anticoagulated with Eliquis No antithrombotic prior to admission, now on Eliquis (apixaban) daily  Therapy recommendations: outpt OT Disposition: Pending  Hypertension Home meds: Amlodipine 10 mg daily, lisinopril 20 mg twice daily Stable BP goal  less than 160 Long-term goal normotensive  Hyperlipidemia Home meds: Rosuvastatin 10 mg daily LDL 54, goal < 70 Resume statin at discharge  Diabetes type II Uncontrolled Home meds: Farxiga 10 mg daily, Rybelsus 7 mg daily HgbA1c 12.8, goal < 7.0 Semglee 18 units daily And insulin aspart 3 units with meals CBGs SSI Diabetes coordinator consult Recommend close follow-up with PCP for better DM control  Other Stroke Risk Factors Advanced age  Other Active Problems Recent sinusitis-continue Augmentin Aphasia -speech on board, recommend home health speech therapy  Hospital day # 3  Patient seen by NP with MD, MD to edit note as needed. Cortney E Ernestina Columbia , MSN, AGACNP-BC [facility] See Amion for schedule and pager information 09/29/2023 1:20 PM  ATTENDING NOTE: I reviewed above note and agree with the assessment and plan. Pt was seen and examined.   Sister is at the bedside. Pt is awake, alert, reclining in bed, eyes open, language much improved, able to have more language output with some short sentences  but still hard to make sense most of the time. Still not following commands. Not able to name or repeat per request. No gaze palsy, tracking bilaterally, inconsistently blinking to visual threat bilaterally, PERRL. No facial droop. Tongue protrusion not cooperative. Bilateral UEs and LEs seem moving symmetrically against gravity. Sensation, coordination and gait not tested.   Etiology patient temporal ICH likely due to CVST venous congestion.  So far CVST unprovoked, but ?  May related to uncontrolled diabetes.  Currently on eliquis, tolerating well.  Speech has improved from yesterday but still aphasic, speech recommend home health.  Continue Augmentin and nystatin for now.  For detailed assessment and plan, please refer to above/below as I have made changes wherever appropriate.   Marvel Plan, MD PhD Stroke Neurology 09/29/2023 6:10 PM  This patient is  critically ill due to CVST, ICH and at significant risk of neurological worsening, death form hematoma expansion, worsening CVST. This patient's care requires constant monitoring of vital signs, hemodynamics, respiratory and cardiac monitoring, review of multiple databases, neurological assessment, discussion with family, other specialists and medical decision making of high complexity. I spent 50 minutes of neurocritical care time in the care of this patient. I had long discussion with wife at bedside and son over the phone, updated pt current condition, treatment plan and potential prognosis, and answered all the questions. They expressed understanding and appreciation.        To contact Stroke Continuity provider, please refer to [email or web address]. After hours, contact General Neurology

## 2023-09-29 NOTE — TOC Progression Note (Signed)
 Transition of Care [facility]) - Progression Note    Patient Details  Name: Howard Johnson MRN: [id number] Date of Birth: 10/03/1955  [facility] CM/SW Contact  Ronny Bacon, RN Phone Number: 09/29/2023, 3:04 PM  Clinical Narrative:  Secure message from provide regarding family wanting to talk with social worker. Phone call to son, reports patient is expected to be discharged Monday or Tuesday and needs home health services. No preference on home health agency. Referral to [facility] with [facility] for [facility] PT/OT/SLP. Provider aware of need for home health orders with face to face.       Barriers to Discharge: Continued Medical Work up  Expected Discharge Plan and Services                                     [facility] Agency: [facility] Date [facility] Agency Contacted: 09/29/23 Time HH Agency Contacted: 1450 Representative spoke with at [facility] Agency: Kandee Keen   Social Determinants of Health (SDOH) Interventions SDOH Screenings   Tobacco Use: Low Risk  (09/26/2023)    Readmission Risk Interventions     No data to display

## 2023-09-29 NOTE — Inpatient Diabetes Management (Signed)
 Inpatient Diabetes Program Recommendations  AACE/ADA: New Consensus Statement on Inpatient Glycemic Control (2015)  Target Ranges:  Prepandial:   less than 140 mg/dL      Peak postprandial:   less than 180 mg/dL (1-2 hours)      Critically ill patients:  140 - 180 mg/dL   Lab Results  Component Value Date   GLUCAP 269 (H) 09/29/2023   HGBA1C 12.8 (H) 11/23/2020    Review of Glycemic Control  Latest Reference Range & Units 09/29/23 07:47 09/29/23 12:16  Glucose-Capillary 70 - 99 mg/dL 161 (H) 096 (H)  (H): Data is abnormally high  Diabetes history: DM2 Outpatient Diabetes medications: Farxiga 10 mg daily, Rybelsus 7 mg, Metformin 500 mg BID Current orders for Inpatient glycemic control: Novolog 0-9 units correction scale TID, Novolog 0-5 units HS scale     Inpatient Diabetes Program Recommendations:    Semglee 18 units every day Novolog 3 units TID with meals if he consumes at least 50%  Will continue to follow while inpatient.  Thank you, Dulce Sellar, MSN, CDCES Diabetes Coordinator Inpatient Diabetes Program [phone] (team pager from 8a-5p)

## 2023-09-29 NOTE — Progress Notes (Signed)
 Speech Language Pathology Treatment: Dysphagia  Patient Details Name: Howard Johnson MRN: [id number] DOB: 1955-10-04 Today's Date: 09/29/2023 Time: 1308-6578 SLP Time Calculation (min) (ACUTE ONLY): 19 min  Assessment / Plan / Recommendation Clinical Impression  Patient presents with a normal oropharyngeal swallow with regular solids and thin liquids based on bedside exam. 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 Sister present and agrees with diet upgrade to regular with thin liquids. No SLP f/u for swallowing indicated but will f/u for aphasia treatment.   HPI HPI: 68 yo presenting 2/13 with several days of abnormal speech. Head CT revealed a small area of acute hemorrhage and MRI revealed findings concerning for venous sinus thrombosis. This was confirmed on CT venogram which also noted expansion of his temporal lobe hematoma. PMH includes: T2DM, HTN, HLD, recent dx sinusitis      SLP Plan   (dysphagia goals met)      Recommendations for follow up therapy are one component of a multi-disciplinary discharge planning process, led by the attending physician.  Recommendations may be updated based on patient status, additional functional criteria and insurance authorization.    Recommendations  Diet recommendations: Regular;Thin liquid Liquids provided via: Cup;Straw Medication Administration: Whole meds with liquid Supervision: Patient able to self feed Compensations: Slow rate;Small sips/bites Postural Changes and/or Swallow Maneuvers: Seated upright 90 degrees                  Oral care BID   Frequent or constant Supervision/Assistance Aphasia (R47.01)      (dysphagia goals met)    Howard Lango MA, CCC-SLP  Howard Johnson Howard Johnson  09/29/2023, 4:05 PM

## 2023-09-30 ENCOUNTER — Other Ambulatory Visit (HOSPITAL_COMMUNITY): Payer: Self-pay

## 2023-09-30 ENCOUNTER — Telehealth (HOSPITAL_COMMUNITY): Payer: Self-pay | Admitting: Pharmacy Technician

## 2023-09-30 DIAGNOSIS — G08 Intracranial and intraspinal phlebitis and thrombophlebitis: Secondary | ICD-10-CM | POA: Diagnosis not present

## 2023-09-30 LAB — GLUCOSE, CAPILLARY
Glucose-Capillary: 206 mg/dL — ABNORMAL HIGH (ref 70–99)
Glucose-Capillary: 206 mg/dL — ABNORMAL HIGH (ref 70–99)
Glucose-Capillary: 293 mg/dL — ABNORMAL HIGH (ref 70–99)
Glucose-Capillary: 275 mg/dL — ABNORMAL HIGH (ref 70–99)
Glucose-Capillary: 151 mg/dL — ABNORMAL HIGH (ref 70–99)

## 2023-09-30 LAB — HEMOGLOBIN A1C
Hgb A1c MFr Bld: 13 % — ABNORMAL HIGH (ref 4.8–5.6)
Hgb A1c MFr Bld: 12.4 % — ABNORMAL HIGH (ref 4.8–5.6)
Mean Plasma Glucose: 326 mg/dL
Mean Plasma Glucose: 309.18 mg/dL

## 2023-09-30 LAB — CBC
HCT: 44 % (ref 39.0–52.0)
Hemoglobin: 15.1 g/dL (ref 13.0–17.0)
MCH: 27.9 pg (ref 26.0–34.0)
MCHC: 34.3 g/dL (ref 30.0–36.0)
MCV: 81.2 fL (ref 80.0–100.0)
Platelets: 238 10*3/uL (ref 150–400)
RBC: 5.42 MIL/uL (ref 4.22–5.81)
RDW: 14.4 % (ref 11.5–15.5)
WBC: 11 10*3/uL — ABNORMAL HIGH (ref 4.0–10.5)
nRBC: 0 % (ref 0.0–0.2)

## 2023-09-30 LAB — BASIC METABOLIC PANEL
Anion gap: 12 (ref 5–15)
BUN: 24 mg/dL — ABNORMAL HIGH (ref 8–23)
CO2: 20 mmol/L — ABNORMAL LOW (ref 22–32)
Calcium: 9.1 mg/dL (ref 8.9–10.3)
Chloride: 105 mmol/L (ref 98–111)
Creatinine, Ser: 1.33 mg/dL — ABNORMAL HIGH (ref 0.61–1.24)
GFR, Estimated: 58 mL/min — ABNORMAL LOW (ref >60)
Glucose, Bld: 199 mg/dL — ABNORMAL HIGH (ref 70–99)
Potassium: 3.5 mmol/L (ref 3.5–5.1)
Sodium: 137 mmol/L (ref 135–145)

## 2023-09-30 LAB — PROTEIN C, TOTAL: Protein C, Total: 86 % (ref 60–150)

## 2023-09-30 LAB — CARDIOLIPIN ANTIBODIES, IGG, IGM, IGA
Anticardiolipin IgA: <9 [APL'U]/mL (ref 0–11)
Anticardiolipin IgG: <9 [GPL'U]/mL (ref 0–14)
Anticardiolipin IgM: <9 [MPL'U]/mL (ref 0–12)

## 2023-09-30 LAB — HOMOCYSTEINE: Homocysteine: 9.7 umol/L (ref 0.0–17.2)

## 2023-09-30 MED ORDER — LIVING WELL WITH DIABETES BOOK
Freq: Once | Status: AC
  Filled 2023-09-30: qty 1

## 2023-09-30 MED ORDER — POLYETHYLENE GLYCOL 3350 17 G PO PACK
17.0000 g | PACK | Freq: Every day | ORAL | Status: DC
  Administered 2023-09-30: 17 g via ORAL
  Filled 2023-09-30 (×2): qty 1

## 2023-09-30 MED ORDER — DOCUSATE SODIUM 100 MG PO CAPS
100.0000 mg | ORAL_CAPSULE | Freq: Two times a day (BID) | ORAL | Status: DC
  Administered 2023-09-30 (×2): 100 mg via ORAL
  Filled 2023-09-30 (×3): qty 1

## 2023-09-30 MED ORDER — ROSUVASTATIN CALCIUM 5 MG PO TABS
10.0000 mg | ORAL_TABLET | Freq: Every day | ORAL | Status: DC
  Administered 2023-10-01: 10 mg via ORAL
  Filled 2023-09-30: qty 2

## 2023-09-30 MED ORDER — INSULIN STARTER KIT- PEN NEEDLES (ENGLISH)
1.0000 | Freq: Once | Status: AC
  Administered 2023-09-30: 1
  Filled 2023-09-30: qty 1

## 2023-09-30 NOTE — Care Management Important Message (Signed)
 Important Message  Patient Details  Name: Howard Johnson MRN: [id number] Date of Birth: 16-Jun-1956   Important Message Given:  Yes - Medicare IM     Dorena Bodo 09/30/2023, 3:17 PM

## 2023-09-30 NOTE — Plan of Care (Signed)
   Problem: Education: Goal: Knowledge of General Education information will improve Description Including pain rating scale, medication(s)/side effects and non-pharmacologic comfort measures Outcome: Progressing

## 2023-09-30 NOTE — Progress Notes (Signed)

## 2023-09-30 NOTE — Telephone Encounter (Signed)
 Patient Product/process development scientist completed.    The patient is insured through [employer]/ [employer]. Patient has Medicare and is not eligible for a copay card, but may be able to apply for patient assistance or Medicare RX Payment Plan (Patient Must reach out to their plan, if eligible for payment plan), if available.    Ran test claim for Lantus Pen and the current 30 day co-pay is $35.00.  Ran test claim for [employer] 3 Sensor and the current 30 day co-pay is $0.00.  Ran test claim for Dexcom G7 Sensor and Requires Prior Authorization  This test claim was processed through [employer]- copay amounts may vary at other pharmacies due to [employer], or as the patient moves through the different stages of their insurance plan.     Roland Earl, CPHT Pharmacy Technician III Certified Patient Advocate [facility] Patient Advocate Team Direct Number: [phone]  Fax: [phone]

## 2023-09-30 NOTE — Inpatient Diabetes Management (Signed)
 Inpatient Diabetes Program Recommendations  AACE/ADA: New Consensus Statement on Inpatient Glycemic Control (2015)  Target Ranges:  Prepandial:   less than 140 mg/dL      Peak postprandial:   less than 180 mg/dL (1-2 hours)      Critically ill patients:  140 - 180 mg/dL   Lab Results  Component Value Date   GLUCAP 293 (H) 09/30/2023   HGBA1C 12.4 (H) 09/30/2023    Review of Glycemic Control  Latest Reference Range & Units 09/30/23 06:18 09/30/23 08:02 09/30/23 12:08  Glucose-Capillary 70 - 99 mg/dL 147 (H) 829 (H) 562 (H)   Diabetes history: DM2 Outpatient Diabetes medications:  Farxiga 10 mg daily Rybelsus 7 mg daily Current orders for Inpatient glycemic control:  Novolog 0-9 units tid with meals and HS Novolog 3 units tid with meals Semglee 18 units daily  Inpatient Diabetes Program Recommendations:    Note elevated A1C of 12.4%.  Per wife and patient's son- patient was very meticulous with caring for his diabetes.  However they were all sick the past month with the flu and patient was on steroids for 5 days.  Unsure if this affected blood sugars?  Told family and patient that he would likely need insulin at discharge.  Demonstrated use of insulin pen to wife and son.  They verbalized understanding.  Currently patient is unable to participate in education.  Family is also interested in CGM for checking blood sugars (can be put on wife's phone).  Discussed A1C results and importance of glycemic control Discussed goal blood sugars and hypoglycemia signs, symptoms and treatment.  They are hoping that patient will be able to go to CIR.   -per benefits check preferred basal insulin is Lantus solostar pen and Freestyle Libre 3 is a 0$ copay.   Will continue to follow.   Thanks.  Lorenza Cambridge, RN, BC-ADM Inpatient Diabetes Coordinator Pager [phone]  (8a-5p)

## 2023-09-30 NOTE — Progress Notes (Signed)
 Occupational Therapy Treatment Patient Details Name: Howard Johnson MRN: [id number] DOB: 04/26/1956 Today's Date: 09/30/2023   History of present illness 68 yo male recently dx with sinusitis from home with AMS. 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 CT venogram (+) temporal lobe hematoma. PMH Dm2, HTn HLD CKDIII   OT comments  Pt with very slow progression toward established OT goals secondary to continued impairment in communication, cognition, and questionable visual impairment. Pt needing constant tactile cues to follow commands and picking up on very little gestural or visual cues resulting in need for min A with all navigation of environment and initiation of ADL. Sister and OT discussion of support at home and pt with good support but OT believes home would be significantly different from baseline at this time secondary to pt with very limited initiation of any kind of self care. Due to continued poor engagement in self-directed mobility and self care as well as need for constant guidance recommending intensive multidisciplinary rehabilitation >3 hours/day to optimize safety and independence in ADL as well as reduce family burden of care.        If plan is discharge home, recommend the following:  A little help with walking and/or transfers;A little help with bathing/dressing/bathroom;Assistance with cooking/housework;Direct supervision/assist for medications management;Direct supervision/assist for financial management;Assist for transportation;Help with stairs or ramp for entrance   Equipment Recommendations  None recommended by OT    Recommendations for Other Services      Precautions / Restrictions Precautions Precautions: Other (comment) Precaution/Restrictions Comments: expressive and receptive aphasia Restrictions Weight Bearing Restrictions Per Provider Order: No       Mobility Bed Mobility Overal bed mobility: Needs Assistance Bed  Mobility: Supine to Sit     Supine to sit: Min assist     General bed mobility comments: minA to initiate mobility due to impaired command following    Transfers Overall transfer level: Needs assistance Equipment used: None Transfers: Sit to/from Stand Sit to Stand: Min assist           General transfer comment: min A for cues to initiate/follow commands.     Balance Overall balance assessment: Mild deficits observed, not formally tested                                         ADL either performed or assessed with clinical judgement   ADL Overall ADL's : Needs assistance/impaired     Grooming: Oral care;Minimal assistance;Standing Grooming Details (indicate cue type and reason): to initiate; undershooting noted                 Toilet Transfer: Minimal assistance;Ambulation Toilet Transfer Details (indicate cue type and reason): for guidance of direction         Functional mobility during ADLs: Minimal assistance General ADL Comments: pt needs constant guidance for next task and with very limited self initiation of any mobility    Extremity/Trunk Assessment              Vision   Additional Comments: Pt constantly saying "two" secondary to language impairment making visual assessment very difficult based on pt report. Pt appears to experience more discomfort with R eye vision  with greater reports of "two" consistsently on that side but continues to state two with that eye occluded by take and not getting comfort with occlusion glasses regardless of taping. Attempted  various pen and paper tests and pt with max difficulty following directions despite multimodal cues and demonstration by therapist. Seems perseverative on his language. Noted with undershooting with oral care while applying toothpaste to toothbrush but was able to point at all lines on a paper with no error. inconsistent experiences of overshooting with self feeding. To be further  asseessed. Conttacts were donned during session   Perception     Praxis     Communication Communication Communication: Impaired Factors Affecting Communication: Difficulty expressing self (expressive and receptive deficits)   Cognition Arousal: Alert Behavior During Therapy: WFL for tasks assessed/performed Cognition: Cognition impaired         Attention impairment (select first level of impairment): Sustained attention Executive functioning impairment (select all impairments): Initiation, Sequencing, Problem solving OT - Cognition Comments: Cognition difficult to assess secondary to receptive and expressive language deficits however, notably decreased attention to gestural or visual cues as well and needs heavy cueing to determine course of action.                 Following commands: Impaired Following commands impaired: Follows one step commands inconsistently (on eval followed commands more consistently with visual cues but needing constant tactile cues this session)      Cueing   Cueing Techniques: Verbal cues, Gestural cues, Tactile cues, Visual cues  Exercises      Shoulder Instructions       General Comments      Pertinent Vitals/ Pain       Pain Assessment Pain Assessment: No/denies pain  Home Living                                          Prior Functioning/Environment              Frequency  Min 1X/week        Progress Toward Goals  OT Goals(current goals can now be found in the care plan section)  Progress towards OT goals: Progressing toward goals (slowly)  Acute Rehab OT Goals Patient Stated Goal: unable OT Goal Formulation: With patient Time For Goal Achievement: 10/12/23 Potential to Achieve Goals: Good ADL Goals Pt Will Perform Grooming: standing;with modified independence Pt Will Perform Lower Body Dressing: with modified independence;sit to/from stand Pt Will Transfer to Toilet: with modified  independence;ambulating Additional ADL Goal #1: Pt will follow one step commands consistently with min cues Additional ADL Goal #2: OT to continue to assess cognition  Plan      Co-evaluation                 AM-PAC OT "6 Clicks" Daily Activity     Outcome Measure   Help from another person eating meals?: None Help from another person taking care of personal grooming?: A Little Help from another person toileting, which includes using toliet, bedpan, or urinal?: A Little Help from another person bathing (including washing, rinsing, drying)?: A Little Help from another person to put on and taking off regular upper body clothing?: A Little Help from another person to put on and taking off regular lower body clothing?: A Little 6 Click Score: 19    End of Session    OT Visit Diagnosis: Cognitive communication deficit (R41.841);Other symptoms and signs involving cognitive function   Activity Tolerance Patient tolerated treatment well   Patient Left in bed;with call bell/phone within reach;with bed alarm set  Nurse Communication Mobility status        Time: 1127-1207 OT Time Calculation (min): 40 min  Charges: OT General Charges $OT Visit: 1 Visit OT Treatments $Self Care/Home Management : 8-22 mins $Therapeutic Activity: 23-37 mins  Tyler Deis, OTR/L [facility] Acute Rehabilitation Office: [phone]   Myrla Halsted 09/30/2023, 12:57 PM

## 2023-09-30 NOTE — Progress Notes (Addendum)
 STROKE TEAM PROGRESS NOTE   INTERIM HISTORY/SUBJECTIVE Patient's sister is at the bedside.  Patient is sitting up eating lunch in bed in no apparent distress. He is still experiencing global aphasia, is able to say a few words but unable to comprehend communication. Creatinine is improving 1.33 down from 1.49, WBC 11 down from 11.8 OBJECTIVE  CBC    Component Value Date/Time   WBC 11.0 (H) 09/30/2023 0634   RBC 5.42 09/30/2023 0634   HGB 15.1 09/30/2023 0634   HGB 16.6 11/23/2020 1553   HCT 44.0 09/30/2023 0634   HCT 50.0 11/23/2020 1553   PLT 238 09/30/2023 0634   PLT 264 11/23/2020 1553   MCV 81.2 09/30/2023 0634   MCV 84 11/23/2020 1553   MCH 27.9 09/30/2023 0634   MCHC 34.3 09/30/2023 0634   RDW 14.4 09/30/2023 0634   RDW 13.5 11/23/2020 1553   LYMPHSABS 0.4 (L) 09/26/2023 1030   MONOABS 0.8 09/26/2023 1030   EOSABS 0.0 09/26/2023 1030   BASOSABS 0.1 09/26/2023 1030    BMET    Component Value Date/Time   NA 137 09/30/2023 0634   NA 139 11/23/2020 1553   K 3.5 09/30/2023 0634   CL 105 09/30/2023 0634   CO2 20 (L) 09/30/2023 0634   GLUCOSE 199 (H) 09/30/2023 0634   BUN 24 (H) 09/30/2023 0634   BUN 21 11/23/2020 1553   CREATININE 1.33 (H) 09/30/2023 0634   CALCIUM 9.1 09/30/2023 0634   EGFR 55 (L) 11/23/2020 1553   GFRNONAA 58 (L) 09/30/2023 0634    IMAGING past 24 hours VAS US CAROTID Result Date: 09/29/2023 Carotid Arterial Duplex Study Patient Name:  Howard Johnson  Date of Exam:   09/29/2023 Medical Rec #: [id number]    Accession #:    [id number] Date of Birth: 06/06/1956   Patient Gender: M Patient Age:   68 years Exam Location:  [facility] Procedure:      VAS US CAROTID Referring Phys: Marvel Plan --------------------------------------------------------------------------------  Indications:       Speech disturbance. Risk Factors:      Hypertension, hyperlipidemia, Diabetes. 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 Comparison Study:  No prior study on file Performing Technologist: Sherren Kerns RVS  Examination Guidelines: A complete evaluation includes B-mode imaging, spectral Doppler, color Doppler, and power Doppler as needed of all accessible portions of each vessel. Bilateral testing is considered an integral part of a complete examination. Limited examinations for reoccurring indications may be performed as noted.  Right Carotid Findings: +----------+--------+--------+--------+------------------+------------------+           PSV cm/sEDV cm/sStenosisPlaque DescriptionComments           +----------+--------+--------+--------+------------------+------------------+ CCA Prox  100     15                                                   +----------+--------+--------+--------+------------------+------------------+ CCA Distal119     14                                intimal thickening +----------+--------+--------+--------+------------------+------------------+ ICA Prox  105  16              focal and calcificShadowing          +----------+--------+--------+--------+------------------+------------------+ ICA Mid   95      24                                                   +----------+--------+--------+--------+------------------+------------------+ ICA Distal94      23                                                   +----------+--------+--------+--------+------------------+------------------+ ECA       244     11                                                   +----------+--------+--------+--------+------------------+------------------+ +---------+--------+--+--------+--+ VertebralPSV cm/s70EDV cm/s14 +---------+--------+--+--------+--+  Left Carotid Findings: +----------+--------+--------+--------+------------------+------------------+           PSV cm/sEDV cm/sStenosisPlaque DescriptionComments            +----------+--------+--------+--------+------------------+------------------+ CCA Prox  128     19                                                   +----------+--------+--------+--------+------------------+------------------+ CCA Distal116     20                                intimal thickening +----------+--------+--------+--------+------------------+------------------+ ICA Prox  97      21              calcific                             +----------+--------+--------+--------+------------------+------------------+ ICA Mid   120     36                                                   +----------+--------+--------+--------+------------------+------------------+ ICA Distal109     32                                                   +----------+--------+--------+--------+------------------+------------------+ ECA       224     23                                                   +----------+--------+--------+--------+------------------+------------------+ +----------+--------+--------+--------+-------------------+           PSV cm/sEDV cm/sDescribeArm Pressure (  mmHG) +----------+--------+--------+--------+-------------------+ Subclavian130                                         +----------+--------+--------+--------+-------------------+ +---------+--------+--+--------+-+ VertebralPSV cm/s42EDV cm/s7 +---------+--------+--+--------+-+   Summary: Right Carotid: Velocities in the right ICA are consistent with a 1-39% stenosis. Left Carotid: Velocities in the left ICA are consistent with a 1-39% stenosis. Vertebrals:  Bilateral vertebral arteries demonstrate antegrade flow. Subclavians: Right subclavian artery was not visualized. Normal flow              hemodynamics were seen in the left subclavian artery. *See table(s) above for measurements and observations.     Preliminary      Vitals:   09/29/23 2301 09/30/23 0304 09/30/23 0700 09/30/23 1210  BP:  (!) 144/85 122/78 132/77 108/85  Pulse: 85 70 73 95  Resp: 16 16 16    Temp: 98.8 F (37.1 C) 98.8 F (37.1 C) 98.4 F (36.9 C) 98.6 F (37 C)  TempSrc: Oral Oral Oral Oral  SpO2: 95% 95% 95% 93%  Weight:      Height:         PHYSICAL EXAM General:  Alert, well-nourished, well-developed patient in no acute distress Psych:  Mood and affect appropriate for situation CV: Regular rate and rhythm on monitor Respiratory:  Regular, unlabored respirations on supplemental O2 GI: Abdomen soft and nontender   NEURO:  Mental Status: Patient is alert and responds to voice.  He is able to say a few words mostly responds with the answer "2".  He does not answer any questions appropriately and cannot comprehend what is being said to him.  He can mimic  Cranial Nerves:  II: PERRL.  III, IV, VI: EOMI. Eyelids elevate symmetrically.  V: Sensation is intact to light touch and symmetrical to face.  VII: Face is symmetrical resting and smiling VIII: hearing intact to voice. IX, X: Phonation is normal.  XII: tongue is midline without fasciculations. Motor: Able to move all 4 extremities with good antigravity strength Tone: is normal and bulk is normal Sensation- Intact to light touch bilaterally.  Coordination: Unable to perform Gait- deferred  Most Recent NIH 6   ASSESSMENT/PLAN  Howard Johnson is a 68 y.o. male with history of hypertension and hyperlipidemia admitted for several days of speech abnormalities.  He was found to have a small left temporal lobe hemorrhage as well as CVST through the left transverse and sigmoid sinuses on CT venogram.  Progression of IPH was seen, and patient was admitted to ICU for close monitoring.  He is anticoagulated with heparin.  CT chest abdomen and pelvis reveals no occult malignancy. 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 is extending into left upper IJ, progression of patchy left temporal hematoma MRI asymmetrically decreased flow void in left transverse and sigmoid sinus with filling defects in the left sigmoid sinus and proximal IJ, redemonstrated subarachnoid hemorrhage and left temporal lobe Repeat head CT 2/14 unchanged left temporal hematoma Repeat head CT 2/16 stable left temporal hematoma with increased surrounding edema EEG 2/16 no seizures or epileptiform discharges  2D Echo EF 65 to 70% Pan CT no malignancy LDL 54 HgbA1c 12.8 Hypercoagulable work up negative so far VTE prophylaxis - fully anticoagulated with Eliquis No antithrombotic prior to admission, now on Eliquis (apixaban) daily  Therapy recommendations: CIR Disposition: Pending  Hypertension Home meds: Amlodipine 10 mg daily, lisinopril 20 mg twice daily Stable but fluctuate BP goal less than 160 Long-term goal normotensive  Hyperlipidemia Home meds: Rosuvastatin 10 mg daily LDL 54, goal < 70 Resume Crestor 10 Continue statin at discharge  Diabetes type II Uncontrolled Home meds: Farxiga 10 mg daily, Rybelsus 7 mg daily HgbA1c 12.4, goal < 7.0 Semglee 18 units daily And insulin aspart 3 units with meals CBGs SSI Diabetes coordinator consult - will need insulin at home - wife and son educated on insulin injection  Recommend close follow-up with PCP for better DM control  Other Stroke Risk Factors Advanced age  Other Active Problems Recent sinusitis-continue Augmentin  Hospital day # 4  Patient seen by NP with MD, MD to edit note as needed.   Gevena Mart DNP, ACNPC-AG  [facility]   ATTENDING NOTE: I reviewed above note and agree with the assessment and plan. Pt was seen and examined.   Sister,  wife and son are at bedside.  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  Follow eye closure command but not other simple commands.  Moving all extremities symmetrically.  OT and speech to recommend CIR.  Continue Eliquis and statin.  Still has hyperglycemia, on insulin, family educated on insulin injection at home.  For detailed assessment and plan, please refer to above/below as I have made changes wherever appropriate.   Marvel Plan, MD PhD Stroke Neurology 09/30/2023 5:31 PM      To contact Stroke Continuity provider, please refer to [email or web address]. After hours, contact General Neurology

## 2023-09-30 NOTE — Plan of Care (Signed)
  Problem: Education: Goal: Knowledge of General Education information will improve Description: Including pain rating scale, medication(s)/side effects and non-pharmacologic comfort measures 09/30/2023 1703 by Genevie Ann, RN Outcome: Progressing 09/30/2023 1619 by Genevie Ann, RN Outcome: Progressing

## 2023-09-30 NOTE — TOC Progression Note (Signed)
 Transition of Care [facility]) - Progression Note    Patient Details  Name: Howard Johnson MRN: [id number] Date of Birth: 15-Nov-1955  Transition of Care [facility] CM/SW Contact  Kermit Balo, RN Phone Number: 09/30/2023, 1:49 PM  Clinical Narrative:     Plan was for d/c home today but OT feels inpatient rehab may be beneficial. Awaiting CIR eval.  Son is aware.  TOC following.  Expected Discharge Plan: IP Rehab Facility Barriers to Discharge: Continued Medical Work up  Expected Discharge Plan and Services   Discharge Planning Services: CM Consult Post Acute Care Choice: IP Rehab Living arrangements for the past 2 months: Single Family Home                             HH Agency: [facility] Date [facility] Agency Contacted: 09/29/23 Time HH Agency Contacted: 1450 Representative spoke with at [facility] Agency: Kandee Keen   Social Determinants of Health (SDOH) Interventions SDOH Screenings   Tobacco Use: Low Risk  (09/26/2023)    Readmission Risk Interventions     No data to display

## 2023-09-30 NOTE — Progress Notes (Addendum)
 Physical Therapy Treatment and Discharge Patient Details Name: Howard Johnson MRN: [id number] DOB: 08/24/1955 Today's Date: 09/30/2023   History of Present Illness 68 yo male recently dx with sinusitis from home with AMS. 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 CT venogram (+) temporal lobe hematoma. PMH Dm2, HTn HLD CKDIII    PT Comments  Patient continues with expressive and receptive aphasia impacting ability to follow commands for higher level balance assessment. He inconsistently return demonstrates when visual cues/demonstration provided for him to replicate. Of the items on Berg and Dynamic Gait Index that he could replicate, he scored a perfect score except on stairs where he chose to use a hand rail (but did so modified independent). Feel pt could benefit from continued OT and SLP services to address functional tasks, sequencing, initiation, and language, however no followup PT indicated.     If plan is discharge home, recommend the following: A little help with bathing/dressing/bathroom;Direct supervision/assist for medications management;Direct supervision/assist for financial management;Supervision due to cognitive status   Can travel by private vehicle        Equipment Recommendations  None recommended by PT    Recommendations for Other Services       Precautions / Restrictions Precautions Precautions: Other (comment) Precaution/Restrictions Comments: expressive and receptive aphasia Restrictions Weight Bearing Restrictions Per Provider Order: No     Mobility  Bed Mobility Overal bed mobility: Needs Assistance Bed Mobility: Supine to Sit     Supine to sit: Min assist     General bed mobility comments: minA to initiate mobility due to impaired command following    Transfers Overall transfer level: Needs assistance Equipment used: None Transfers: Sit to/from Stand Sit to Stand: Supervision           General transfer  comment: followed gestural cue    Ambulation/Gait Ambulation/Gait assistance: Supervision Gait Distance (Feet): 200 Feet Assistive device: None Gait Pattern/deviations: WFL(Within Functional Limits) Gait velocity: functional     General Gait Details: steady step-through gait   Stairs Stairs: Yes Stairs assistance: Modified independent (Device/Increase time) Stair Management: Two rails, Alternating pattern, Step to pattern, Forwards Number of Stairs: 5 General stair comments: pt could not understand/return demonstrate using no rails   Wheelchair Mobility     Tilt Bed    Modified Rankin (Stroke Patients Only) Modified Rankin (Stroke Patients Only) Pre-Morbid Rankin Score: No symptoms Modified Rankin: Moderate disability     Balance Overall balance assessment: Independent                               Standardized Balance Assessment Standardized Balance Assessment : Berg Balance Test, Dynamic Gait Index Berg Balance Test Sit to Stand: Able to stand without using hands and stabilize independently Standing Unsupported: Able to stand safely 2 minutes Sitting with Back Unsupported but Feet Supported on Floor or Stool: Able to sit safely and securely 2 minutes Stand to Sit: Sits safely with minimal use of hands Transfers: Able to transfer safely, minor use of hands Standing Unsupported with Eyes Closed:  (could not understand to close his eyes despite visual and verbal cues) Standing Ubsupported with Feet Together: Able to place feet together independently and stand 1 minute safely From Standing Position, Pick up Object from Floor:  (could not understand despite demonstration) From Standing Position, Turn to Look Behind Over each Shoulder:  (could not understand despite demonstration) Turn 360 Degrees: Able to turn 360 degrees safely  in 4 seconds or less Standing Unsupported, Alternately Place Feet on Step/Stool:  (could not understand despite  demonstration) Standing Unsupported, One Foot in Front: Able to place foot tandem independently and hold 30 seconds Standing on One Leg:  (could not understand despite demonstration) Dynamic Gait Index Level Surface: Normal Change in Gait Speed:  (could not understand despite demonstration) Gait with Horizontal Head Turns: Normal Gait with Vertical Head Turns:  (could not understand despite demonstration) Gait and Pivot Turn: Normal Step Over Obstacle:  (could not understand despite demonstration) Step Around Obstacles: Normal Steps: Mild Impairment      Communication Communication Communication: Impaired Factors Affecting Communication: Difficulty expressing self (expressive and receptive deficits)  Cognition Arousal: Alert Behavior During Therapy: WFL for tasks assessed/performed   PT - Cognitive impairments: Difficult to assess Difficult to assess due to: Impaired communication                     PT - Cognition Comments: follows ~30% of visual cues/demonstrations Following commands: Impaired Following commands impaired: Follows one step commands inconsistently (followed no pure verbal cues; followed ~30% of visual cues/demonstrations)    Cueing Cueing Techniques: Verbal cues, Gestural cues, Tactile cues, Visual cues  Exercises      General Comments        Pertinent Vitals/Pain Pain Assessment Pain Assessment: Faces Faces Pain Scale: No hurt Breathing: normal Negative Vocalization: none Facial Expression: smiling or inexpressive Body Language: relaxed Consolability: no need to console PAINAD Score: 0    Home Living                          Prior Function            PT Goals (current goals can now be found in the care plan section) Acute Rehab PT Goals Patient Stated Goal: unable to state PT Goal Formulation: With patient Time For Goal Achievement: 10/12/23 Potential to Achieve Goals: Fair Progress towards PT goals: Goals met/education  completed, patient discharged from PT    Frequency    Min 1X/week      PT Plan      Co-evaluation              AM-PAC PT "6 Clicks" Mobility   Outcome Measure  Help needed turning from your back to your side while in a flat bed without using bedrails?: A Little Help needed moving from lying on your back to sitting on the side of a flat bed without using bedrails?: A Little Help needed moving to and from a bed to a chair (including a wheelchair)?: A Little Help needed standing up from a chair using your arms (e.g., wheelchair or bedside chair)?: A Little Help needed to walk in hospital room?: A Little Help needed climbing 3-5 steps with a railing? : A Little 6 Click Score: 18    End of Session Equipment Utilized During Treatment: Gait belt Activity Tolerance: Patient tolerated treatment well Patient left: with call bell/phone within reach;in bed;with bed alarm set Nurse Communication: Mobility status;Other (comment) (condom catheter fell off) PT Visit Diagnosis: Other abnormalities of gait and mobility (R26.89);Other symptoms and signs involving the nervous system (R29.898)   PT Discharge Note  Patient is being discharged from PT services secondary to:  Goals met and no further therapy needs identified.  Please see latest Therapy Progress Note for current level of functioning and progress toward goals.  Progress and discharge plan and discussed with patient/caregiver and  they  Patient unable to participate in discharge planning   Time: 1259-1313 PT Time Calculation (min) (ACUTE ONLY): 14 min  Charges:    $Gait Training: 8-22 mins PT General Charges $$ ACUTE PT VISIT: 1 Visit                      Jerolyn Center, PT Acute Rehabilitation Services  Office [phone]    Zena Amos 09/30/2023, 1:23 PM

## 2023-10-01 ENCOUNTER — Encounter (HOSPITAL_COMMUNITY): Payer: Self-pay | Admitting: Physical Medicine and Rehabilitation

## 2023-10-01 ENCOUNTER — Encounter (HOSPITAL_COMMUNITY): Payer: Self-pay | Admitting: Neurology

## 2023-10-01 ENCOUNTER — Inpatient Hospital Stay (HOSPITAL_COMMUNITY)
Admission: AD | Admit: 2023-10-01 | Discharge: 2023-10-08 | DRG: 945 | Disposition: A | Discharge status: Home / Self Care | Payer: HMO | Source: Intra-hospital | Attending: Physical Medicine and Rehabilitation | Admitting: Physical Medicine and Rehabilitation

## 2023-10-01 ENCOUNTER — Other Ambulatory Visit: Payer: Self-pay

## 2023-10-01 DIAGNOSIS — R4701 Aphasia: Secondary | ICD-10-CM | POA: Diagnosis not present

## 2023-10-01 DIAGNOSIS — D72829 Elevated white blood cell count, unspecified: Secondary | ICD-10-CM | POA: Diagnosis present

## 2023-10-01 DIAGNOSIS — N1831 Chronic kidney disease, stage 3a: Secondary | ICD-10-CM | POA: Diagnosis present

## 2023-10-01 DIAGNOSIS — I619 Nontraumatic intracerebral hemorrhage, unspecified: Secondary | ICD-10-CM | POA: Diagnosis present

## 2023-10-01 DIAGNOSIS — K5900 Constipation, unspecified: Secondary | ICD-10-CM | POA: Diagnosis not present

## 2023-10-01 DIAGNOSIS — R5381 Other malaise: Secondary | ICD-10-CM | POA: Diagnosis not present

## 2023-10-01 DIAGNOSIS — I10 Essential (primary) hypertension: Secondary | ICD-10-CM | POA: Diagnosis not present

## 2023-10-01 DIAGNOSIS — I69218 Other symptoms and signs involving cognitive functions following other nontraumatic intracranial hemorrhage: Secondary | ICD-10-CM | POA: Diagnosis not present

## 2023-10-01 DIAGNOSIS — R519 Headache, unspecified: Secondary | ICD-10-CM | POA: Diagnosis present

## 2023-10-01 DIAGNOSIS — I69220 Aphasia following other nontraumatic intracranial hemorrhage: Secondary | ICD-10-CM | POA: Diagnosis not present

## 2023-10-01 DIAGNOSIS — E1165 Type 2 diabetes mellitus with hyperglycemia: Secondary | ICD-10-CM | POA: Diagnosis present

## 2023-10-01 DIAGNOSIS — I69290 Apraxia following other nontraumatic intracranial hemorrhage: Secondary | ICD-10-CM

## 2023-10-01 DIAGNOSIS — R21 Rash and other nonspecific skin eruption: Secondary | ICD-10-CM | POA: Diagnosis present

## 2023-10-01 DIAGNOSIS — E876 Hypokalemia: Secondary | ICD-10-CM | POA: Diagnosis present

## 2023-10-01 DIAGNOSIS — G08 Intracranial and intraspinal phlebitis and thrombophlebitis: Principal | ICD-10-CM

## 2023-10-01 DIAGNOSIS — E872 Acidosis, unspecified: Secondary | ICD-10-CM | POA: Diagnosis present

## 2023-10-01 DIAGNOSIS — E1169 Type 2 diabetes mellitus with other specified complication: Secondary | ICD-10-CM | POA: Diagnosis not present

## 2023-10-01 DIAGNOSIS — I629 Nontraumatic intracranial hemorrhage, unspecified: Secondary | ICD-10-CM | POA: Diagnosis present

## 2023-10-01 DIAGNOSIS — E785 Hyperlipidemia, unspecified: Secondary | ICD-10-CM | POA: Diagnosis present

## 2023-10-01 DIAGNOSIS — N189 Chronic kidney disease, unspecified: Secondary | ICD-10-CM | POA: Diagnosis not present

## 2023-10-01 DIAGNOSIS — Z91048 Other nonmedicinal substance allergy status: Secondary | ICD-10-CM | POA: Diagnosis not present

## 2023-10-01 DIAGNOSIS — I4891 Unspecified atrial fibrillation: Secondary | ICD-10-CM | POA: Diagnosis present

## 2023-10-01 DIAGNOSIS — H532 Diplopia: Secondary | ICD-10-CM | POA: Diagnosis present

## 2023-10-01 DIAGNOSIS — E119 Type 2 diabetes mellitus without complications: Secondary | ICD-10-CM

## 2023-10-01 DIAGNOSIS — Z7722 Contact with and (suspected) exposure to environmental tobacco smoke (acute) (chronic): Secondary | ICD-10-CM | POA: Diagnosis not present

## 2023-10-01 DIAGNOSIS — Z7901 Long term (current) use of anticoagulants: Secondary | ICD-10-CM | POA: Diagnosis not present

## 2023-10-01 DIAGNOSIS — Z833 Family history of diabetes mellitus: Secondary | ICD-10-CM | POA: Diagnosis not present

## 2023-10-01 DIAGNOSIS — Z794 Long term (current) use of insulin: Secondary | ICD-10-CM

## 2023-10-01 DIAGNOSIS — Z79899 Other long term (current) drug therapy: Secondary | ICD-10-CM | POA: Diagnosis not present

## 2023-10-01 LAB — BASIC METABOLIC PANEL
Anion gap: 12 (ref 5–15)
BUN: 24 mg/dL — ABNORMAL HIGH (ref 8–23)
CO2: 21 mmol/L — ABNORMAL LOW (ref 22–32)
Calcium: 9.2 mg/dL (ref 8.9–10.3)
Chloride: 104 mmol/L (ref 98–111)
Creatinine, Ser: 1.26 mg/dL — ABNORMAL HIGH (ref 0.61–1.24)
GFR, Estimated: >60 mL/min (ref >60)
Glucose, Bld: 157 mg/dL — ABNORMAL HIGH (ref 70–99)
Potassium: 3.5 mmol/L (ref 3.5–5.1)
Sodium: 137 mmol/L (ref 135–145)

## 2023-10-01 LAB — PROTEIN S ACTIVITY: Protein S Activity: 77 % (ref 63–140)

## 2023-10-01 LAB — CBC
HCT: 45.4 % (ref 39.0–52.0)
Hemoglobin: 15.3 g/dL (ref 13.0–17.0)
MCH: 27.3 pg (ref 26.0–34.0)
MCHC: 33.7 g/dL (ref 30.0–36.0)
MCV: 80.9 fL (ref 80.0–100.0)
Platelets: 262 10*3/uL (ref 150–400)
RBC: 5.61 MIL/uL (ref 4.22–5.81)
RDW: 14.3 % (ref 11.5–15.5)
WBC: 11.4 10*3/uL — ABNORMAL HIGH (ref 4.0–10.5)
nRBC: 0 % (ref 0.0–0.2)

## 2023-10-01 LAB — PROTEIN C ACTIVITY: Protein C Activity: 88 % (ref 73–180)

## 2023-10-01 LAB — GLUCOSE, CAPILLARY
Glucose-Capillary: 175 mg/dL — ABNORMAL HIGH (ref 70–99)
Glucose-Capillary: 194 mg/dL — ABNORMAL HIGH (ref 70–99)
Glucose-Capillary: 155 mg/dL — ABNORMAL HIGH (ref 70–99)
Glucose-Capillary: 238 mg/dL — ABNORMAL HIGH (ref 70–99)

## 2023-10-01 LAB — CULTURE, BLOOD (ROUTINE X 2)
Culture: NO GROWTH
Culture: NO GROWTH
Special Requests: ADEQUATE
Special Requests: ADEQUATE

## 2023-10-01 LAB — PROTEIN S, TOTAL: Protein S Ag, Total: 82 % (ref 60–150)

## 2023-10-01 LAB — LUPUS ANTICOAGULANT PANEL
DRVVT: 46.5 s (ref 0.0–47.0)
PTT Lupus Anticoagulant: 40.6 s (ref 0.0–43.5)

## 2023-10-01 MED ORDER — ACETAMINOPHEN 325 MG PO TABS
650.0000 mg | ORAL_TABLET | Freq: Two times a day (BID) | ORAL | Status: DC
  Administered 2023-10-01 – 2023-10-08 (×14): 650 mg via ORAL
  Filled 2023-10-01 (×14): qty 2

## 2023-10-01 MED ORDER — APIXABAN 5 MG PO TABS: 5.0000 mg | ORAL_TABLET | Freq: Two times a day (BID) | ORAL | Status: DC

## 2023-10-01 MED ORDER — PROCHLORPERAZINE MALEATE 5 MG PO TABS: 5.0000 mg | ORAL_TABLET | Freq: Four times a day (QID) | ORAL | Status: DC | PRN

## 2023-10-01 MED ORDER — BISACODYL 10 MG RE SUPP: 10.0000 mg | Freq: Every day | RECTAL | Status: DC | PRN

## 2023-10-01 MED ORDER — ALUM & MAG HYDROXIDE-SIMETH 200-200-20 MG/5ML PO SUSP: 30.0000 mL | ORAL | Status: DC | PRN

## 2023-10-01 MED ORDER — APIXABAN 5 MG PO TABS
5.0000 mg | ORAL_TABLET | Freq: Two times a day (BID) | ORAL | Status: DC
  Administered 2023-10-01 – 2023-10-08 (×14): 5 mg via ORAL
  Filled 2023-10-01 (×14): qty 1

## 2023-10-01 MED ORDER — GUAIFENESIN-DM 100-10 MG/5ML PO SYRP: 5.0000 mL | ORAL_SOLUTION | Freq: Four times a day (QID) | ORAL | Status: DC | PRN

## 2023-10-01 MED ORDER — INSULIN ASPART 100 UNIT/ML IJ SOLN
0.0000 [IU] | Freq: Every day | INTRAMUSCULAR | Status: DC
  Administered 2023-10-01 – 2023-10-02 (×2): 2 [IU] via SUBCUTANEOUS

## 2023-10-01 MED ORDER — FLUTICASONE PROPIONATE 50 MCG/ACT NA SUSP
2.0000 | Freq: Every day | NASAL | Status: DC
  Administered 2023-10-01 – 2023-10-08 (×8): 2 via NASAL
  Filled 2023-10-01: qty 16

## 2023-10-01 MED ORDER — PROCHLORPERAZINE EDISYLATE 10 MG/2ML IJ SOLN: 5.0000 mg | Freq: Four times a day (QID) | INTRAMUSCULAR | Status: DC | PRN

## 2023-10-01 MED ORDER — INSULIN ASPART 100 UNIT/ML IJ SOLN: 3.0000 [IU] | Freq: Three times a day (TID) | INTRAMUSCULAR | Status: DC

## 2023-10-01 MED ORDER — ACETAMINOPHEN 325 MG PO TABS: 325.0000 mg | ORAL_TABLET | ORAL | Status: DC | PRN

## 2023-10-01 MED ORDER — INSULIN ASPART 100 UNIT/ML IJ SOLN: 0.0000 [IU] | Freq: Three times a day (TID) | INTRAMUSCULAR | Status: DC

## 2023-10-01 MED ORDER — INSULIN GLARGINE-YFGN 100 UNIT/ML ~~LOC~~ SOLN: 18.0000 [IU] | Freq: Every day | SUBCUTANEOUS | Status: DC

## 2023-10-01 MED ORDER — INSULIN ASPART 100 UNIT/ML IJ SOLN
3.0000 [IU] | Freq: Three times a day (TID) | INTRAMUSCULAR | Status: DC
  Administered 2023-10-02 – 2023-10-06 (×14): 3 [IU] via SUBCUTANEOUS

## 2023-10-01 MED ORDER — DIPHENHYDRAMINE HCL 25 MG PO CAPS: 25.0000 mg | ORAL_CAPSULE | Freq: Four times a day (QID) | ORAL | Status: DC | PRN

## 2023-10-01 MED ORDER — LORATADINE 10 MG PO TABS
10.0000 mg | ORAL_TABLET | Freq: Every day | ORAL | Status: DC
  Administered 2023-10-01 – 2023-10-08 (×8): 10 mg via ORAL
  Filled 2023-10-01 (×8): qty 1

## 2023-10-01 MED ORDER — ACETAMINOPHEN 325 MG PO TABS: 650.0000 mg | ORAL_TABLET | ORAL | Status: DC | PRN

## 2023-10-01 MED ORDER — SENNOSIDES-DOCUSATE SODIUM 8.6-50 MG PO TABS: 1.0000 | ORAL_TABLET | Freq: Two times a day (BID) | ORAL | Status: DC

## 2023-10-01 MED ORDER — PROCHLORPERAZINE 25 MG RE SUPP: 12.5000 mg | Freq: Four times a day (QID) | RECTAL | Status: DC | PRN

## 2023-10-01 MED ORDER — INSULIN ASPART 100 UNIT/ML IJ SOLN
0.0000 [IU] | Freq: Every day | INTRAMUSCULAR | Status: DC
Start: 2023-10-01 — End: 2023-10-08

## 2023-10-01 MED ORDER — QUETIAPINE FUMARATE 25 MG PO TABS: 12.5000 mg | ORAL_TABLET | Freq: Three times a day (TID) | ORAL | Status: DC | PRN

## 2023-10-01 MED ORDER — ONDANSETRON HCL 4 MG/2ML IJ SOLN: 4.0000 mg | INTRAMUSCULAR | Status: DC | PRN

## 2023-10-01 MED ORDER — POLYETHYLENE GLYCOL 3350 17 G PO PACK: 17.0000 g | PACK | Freq: Every day | ORAL | Status: DC

## 2023-10-01 MED ORDER — TRAZODONE HCL 50 MG PO TABS: 25.0000 mg | ORAL_TABLET | Freq: Every evening | ORAL | Status: DC | PRN

## 2023-10-01 MED ORDER — FLEET ENEMA RE ENEM: 1.0000 | ENEMA | Freq: Once | RECTAL | Status: DC | PRN

## 2023-10-01 MED ORDER — ROSUVASTATIN CALCIUM 5 MG PO TABS
10.0000 mg | ORAL_TABLET | Freq: Every day | ORAL | Status: DC
  Administered 2023-10-02 – 2023-10-07 (×6): 10 mg via ORAL
  Filled 2023-10-01 (×6): qty 2

## 2023-10-01 MED ORDER — INSULIN GLARGINE-YFGN 100 UNIT/ML ~~LOC~~ SOLN
18.0000 [IU] | Freq: Every day | SUBCUTANEOUS | Status: DC
  Administered 2023-10-02: 18 [IU] via SUBCUTANEOUS
  Filled 2023-10-01: qty 0.18

## 2023-10-01 MED ORDER — INSULIN ASPART 100 UNIT/ML IJ SOLN
0.0000 [IU] | Freq: Three times a day (TID) | INTRAMUSCULAR | Status: DC
  Administered 2023-10-02: 3 [IU] via SUBCUTANEOUS
  Administered 2023-10-02: 2 [IU] via SUBCUTANEOUS
  Administered 2023-10-03: 1 [IU] via SUBCUTANEOUS
  Administered 2023-10-03: 3 [IU] via SUBCUTANEOUS
  Administered 2023-10-04: 1 [IU] via SUBCUTANEOUS
  Administered 2023-10-05: 2 [IU] via SUBCUTANEOUS
  Administered 2023-10-06 – 2023-10-08 (×4): 1 [IU] via SUBCUTANEOUS

## 2023-10-01 MED ORDER — SENNOSIDES-DOCUSATE SODIUM 8.6-50 MG PO TABS
2.0000 | ORAL_TABLET | Freq: Every day | ORAL | Status: DC
Start: 2023-10-02 — End: 2023-10-08
  Administered 2023-10-02 – 2023-10-08 (×7): 2 via ORAL
  Filled 2023-10-01 (×7): qty 2

## 2023-10-01 NOTE — Progress Notes (Signed)
 Inpatient Rehabilitation Admission Medication Review by a Pharmacist  A complete drug regimen review was completed for this patient to identify any potential clinically significant medication issues.  High Risk Drug Classes Is patient taking? Indication by Medication  Antipsychotic Yes, as an intravenous medication Prochlorperazine - nausea, quetiapine - agitation  Anticoagulant Yes Apixaban - stroke prophylaxis   Antibiotic No   Opioid No   Antiplatelet No   Hypoglycemics/insulin Yes Aspart, glargine - T2DM  Vasoactive Medication No   Chemotherapy No   Other Yes Acetaminophen - pain Fluticasone nasal, loratadine - congestion Rosuvastatin - HLD Senna-docusate - constipation   PRNs Maalox - indigestion Robitussin DM - cough Bisacodyl, Fleet - constipation Diphenhydramine - itching  Trazodone - sleep     Type of Medication Issue Identified Description of Issue Recommendation(s)  Drug Interaction(s) (clinically significant)     Duplicate Therapy     Allergy     No Medication Administration End Date     Incorrect Dose     Additional Drug Therapy Needed     Significant med changes from prior encounter (inform family/care partners about these prior to discharge). PTA medications not resumed: amlodipine, dapagliflozin, lisinopril, prednisone, semaglutide  Completed Augmentin course Consider resuming PTA medications during CIR admit or upon discharge as appropriate    Other       Clinically significant medication issues were identified that warrant physician communication and completion of prescribed/recommended actions by midnight of the next day:  No  Name of provider notified for urgent issues identified:   Provider Method of Notification:     Pharmacist comments:   Time spent performing this drug regimen review (minutes):  20   Thank you for involving pharmacy in this patient's care.  Loura Back, PharmD, BCPS Clinical Pharmacist 10/01/2023 8:11 PM

## 2023-10-01 NOTE — Progress Notes (Signed)
 Inpatient Rehab Admissions Coordinator:    CIR following, I await decision from Pt.'s insurance.   Megan Salon, MS, CCC-SLP Rehab Admissions Coordinator  [phone] (celll) [phone] (office)

## 2023-10-01 NOTE — Inpatient Diabetes Management (Signed)
 Inpatient Diabetes Program Recommendations  AACE/ADA: New Consensus Statement on Inpatient Glycemic Control (2015)  Target Ranges:  Prepandial:   less than 140 mg/dL      Peak postprandial:   less than 180 mg/dL (1-2 hours)      Critically ill patients:  140 - 180 mg/dL   Lab Results  Component Value Date   GLUCAP 194 (H) 10/01/2023   HGBA1C 12.4 (H) 09/30/2023    Inpatient Diabetes Program Recommendations:    Discharge Recommendations: Other recommendations: Please order Freestyle Libre sensor [phone] (2 per month) plus oral agents for DM Long acting recommendations: Insulin Glargine (LANTUS) Solostar Pen 18 units daily  Supply/Referral recommendations: Glucometer Test strips Lancet device Lancets Pen needles - standard   Use Adult Diabetes Insulin Treatment Post Discharge order set.  Thanks,  Lorenza Cambridge, RN, BC-ADM Inpatient Diabetes Coordinator Pager [phone]  (8a-5p)

## 2023-10-01 NOTE — Inpatient Diabetes Management (Signed)
 Inpatient Diabetes Program Recommendations  AACE/ADA: New Consensus Statement on Inpatient Glycemic Control (2015)  Target Ranges:  Prepandial:   less than 140 mg/dL      Peak postprandial:   less than 180 mg/dL (1-2 hours)      Critically ill patients:  140 - 180 mg/dL   Lab Results  Component Value Date   GLUCAP 194 (H) 10/01/2023   HGBA1C 12.4 (H) 09/30/2023    Review of Glycemic Control  Latest Reference Range & Units 10/01/23 06:21 10/01/23 11:52  Glucose-Capillary 70 - 99 mg/dL 295 (H) 621 (H)   Diabetes history: DM2 Outpatient Diabetes medications:  Farxiga 10 mg daily Rybelsus 7 mg daily Current orders for Inpatient glycemic control:  Novolog 0-9 units tid with meals and HS Novolog 3 units tid with meals Semglee 18 units daily  Inpatient Diabetes Program Recommendations:    Spoke with patient's wife and son again.  Note that Freestyle Josephine Igo is approved at [employer].   Reviewed use of insulin pen again and wife was able to return demo.  Also gave the FSL sample and taught how them how to place and also helped set-up app on sons phone.  Plan to f/u with Dr. Margo Aye.    At d/c please order Lantus solostar pen and CGM [employer].   Thanks,  Howard Cambridge, RN, BC-ADM Inpatient Diabetes Coordinator Pager [phone]  (8a-5p)

## 2023-10-01 NOTE — Progress Notes (Signed)
 Inpatient Rehab Admissions Coordinator:    I received insurance auth for CIR and will admit Pt. Today. RN may call report to [phone].  Pt. To admit to CIR for 5-7 days with the goal of discharging home with his wife.   Megan Salon, MS, CCC-SLP Rehab Admissions Coordinator  [phone] (celll) [phone] (office)

## 2023-10-01 NOTE — Plan of Care (Signed)
  Problem: Consults Goal: RH STROKE PATIENT EDUCATION Description: See Patient Education module for education specifics  Outcome: Progressing   Problem: RH BOWEL ELIMINATION Goal: RH STG MANAGE BOWEL WITH ASSISTANCE Description: STG Manage Bowel with toileting Assistance. Outcome: Progressing Goal: RH STG MANAGE BOWEL W/MEDICATION W/ASSISTANCE Description: STG Manage Bowel with Medication with mod I  Assistance. Outcome: Progressing   Problem: RH BLADDER ELIMINATION Goal: RH STG MANAGE BLADDER WITH ASSISTANCE Description: STG Manage Bladder With toileting Assistance Outcome: Progressing   Problem: RH SAFETY Goal: RH STG ADHERE TO SAFETY PRECAUTIONS W/ASSISTANCE/DEVICE Description: STG Adhere to Safety Precautions With cues Assistance/Device. Outcome: Progressing   Problem: RH COGNITION-NURSING Goal: RH STG USES MEMORY AIDS/STRATEGIES W/ASSIST TO PROBLEM SOLVE Description: STG Uses Memory Aids/Strategies With cues Assistance to Problem Solve. Outcome: Progressing   Problem: RH KNOWLEDGE DEFICIT Goal: RH STG INCREASE KNOWLEDGE OF DIABETES Description: Patient and family will be able to manage DM using educational resources for medications and dietary modification independently Outcome: Progressing Goal: RH STG INCREASE KNOWLEDGE OF HYPERTENSION Description: Patient and family will be able to manage HTN using educational resources for medications and dietary modification independently Outcome: Progressing Goal: RH STG INCREASE KNOWLEDGE OF STROKE PROPHYLAXIS Description: Patient and family will be able to manage secondary risk using educational resources for medications and dietary modification independently Outcome: Progressing

## 2023-10-01 NOTE — PMR Pre-admission (Signed)
 PMR Admission Coordinator Pre-Admission Assessment  Patient: Howard Johnson is an 68 y.o., male MRN: [id number] DOB: 01/20/1956 Height: 6\' 4"  (193 cm) Weight: 92.2 kg              Insurance Information HMO: yes    PPO:      PCP:      IPA:      80/20:      OTHER:  [employer]      Policy#: [id number], Medicare: [id number]      Subscriber: Pt CM Name: Tammy       Phone#:  [phone]    Fax#: [phone] Tammy called 2/18 stating Pt. Approved 9/56-3/87 Pre-Cert#: [id number]      Employer:  Benefits:  Phone #: [phone]     Name: Lynelle Doctor Date: 08/14/2023 - 08/12/2024 Deductible: does not have one OOP Max: $3,400 ($0 met) CIR: $195/day co-pay for days 1-6, $0/day days 7-90 SNF:  $0/day co-pay for days 1-20, $214/day co-pay for days 21-100; limited to 100 days/benefit period Outpatient: $5/visit co-pay; limited by medical necessity Home Health:  100% coverage DME: 80% coverage; 20% co-insurance Providers: in network  SECONDARY:       Policy#:       Phone#:   Artist:       Phone#:   The Data processing manager" for patients in Inpatient Rehabilitation Facilities with attached "Privacy Act Statement-Health Care Records" was provided and verbally reviewed with: Family  Emergency Contact Information Contact Information     Name Relation Home Work Oval Son [phone]  [phone]      Other Contacts     Name Relation Home Work Mobile   Cerullo,Margaret Spouse   [phone]      Current Medical History  Patient Admitting Diagnosis: SAH  History of Present Illness:  [name] is a 68 y.o. male with hx of DM, HTN who presented to [facility] on 09/26/23 with confusion,agitation, abnormal speech, ?URI. W/u positive for leukocytosis. CXR + for bilateral interstitial thickening which was felt to be chronic. CT of head revealed a small focus of acute hemorrhage within the left temporal lobe. 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 Pt was placed on eliquis by neurology.  Pt was up with PT yesterday and was supervision for sit-std transfers and 200' ambulation without an AD. He required min assist for basic ADL's. 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 He's on regular diet currently. Pt lives with spouse in a one level house with 2 steps to enter. He was independent and driving PTA. Therapies recommend CIR to assist return to PLOF.  Complete NIHSS TOTAL: 7 Glasgow Coma Scale Score: 14  Patient's medical record from [facility]  has been reviewed by the rehabilitation admission coordinator and physician.  Past Medical History  Past Medical History:  Diagnosis Date   Diabetes mellitus without complication (HCC)    Hyperlipidemia    Hypertension     Has the patient had major surgery during 100 days prior to admission? No  Family History  family history is not on file.   Current Medications   Current Facility-Administered Medications:    acetaminophen (TYLENOL) tablet 650 mg, 650 mg, Oral, Q4H PRN, 650  mg at 09/30/23 0938 **OR** acetaminophen (TYLENOL) 160 MG/5ML solution 650 mg, 650 mg, Per Tube, Q4H PRN **OR** acetaminophen (TYLENOL) suppository 650 mg, 650 mg, Rectal, Q4H PRN, Tyrone Nine, MD   apixaban (ELIQUIS) tablet 5 mg, 5 mg, Oral, BID, Calton Dach I, RPH, 5 mg at 10/01/23 5366   Chlorhexidine Gluconate Cloth 2 % PADS 6 each, 6 each, Topical, Daily, Rejeana Brock, MD, 6 each at 10/01/23 0912   docusate sodium (COLACE) capsule 100 mg, 100 mg, Oral, BID, Gevena Mart A, NP, 100 mg at 09/30/23 2140   insulin aspart (novoLOG) injection 0-5 Units, 0-5 Units,  Subcutaneous, QHS, Hazeline Junker B, MD, 2 Units at 09/28/23 2219   insulin aspart (novoLOG) injection 0-9 Units, 0-9 Units, Subcutaneous, TID WC, Tyrone Nine, MD, 2 Units at 10/01/23 1226   insulin aspart (novoLOG) injection 3 Units, 3 Units, Subcutaneous, TID WC, de Saintclair Halsted, [place] E, NP, 3 Units at 10/01/23 1227   insulin glargine-yfgn (SEMGLEE) injection 18 Units, 18 Units, Subcutaneous, Daily, de Saintclair Halsted, [place] E, NP, 18 Units at 10/01/23 0916   labetalol (NORMODYNE) injection 10 mg, 10 mg, Intravenous, Q2H PRN, Rejeana Brock, MD, 10 mg at 09/29/23 1959   ondansetron (ZOFRAN) injection 4 mg, 4 mg, Intravenous, Q4H PRN, Rejeana Brock, MD, 4 mg at 09/26/23 2331   Oral care mouth rinse, 15 mL, Mouth Rinse, 4 times per day, Rejeana Brock, MD, 15 mL at 10/01/23 0912   Oral care mouth rinse, 15 mL, Mouth Rinse, PRN, Rejeana Brock, MD, 15 mL at 09/27/23 2152   polyethylene glycol (MIRALAX / GLYCOLAX) packet 17 g, 17 g, Oral, Daily, Gevena Mart A, NP, 17 g at 09/30/23 1450   QUEtiapine (SEROQUEL) tablet 12.5 mg, 12.5 mg, Oral, TID PRN, de Saintclair Halsted, Cortney E, NP   rosuvastatin (CRESTOR) tablet 10 mg, 10 mg, Oral, Daily, Marvel Plan, MD, 10 mg at 10/01/23 0913   senna-docusate (Senokot-S) tablet 1 tablet, 1 tablet, Oral, BID, Rejeana Brock, MD, 1 tablet at 09/30/23 2139  Patients Current Diet:  Diet Order             Diet regular Fluid consistency: Thin  Diet effective now                   Precautions / Restrictions Precautions Precautions: Other (comment) Precaution/Restrictions Comments: expressive and receptive aphasia Restrictions Weight Bearing Restrictions Per Provider Order: No   Has the patient had 2 or more falls or a fall with injury in the past year?No  Prior Activity Level Community (5-7x/wk): Pt. active in the community PTA  Prior Functional Level Prior Function Prior Level of Function : Independent/Modified  Independent, Working/employed, Driving  Self Care: Did the patient need help bathing, dressing, using the toilet or eating?  Independent  Indoor Mobility: Did the patient need assistance with walking from room to room (with or without device)? Independent  Stairs: Did the patient need assistance with internal or external stairs (with or without device)? Independent  Functional Cognition: Did the patient need help planning regular tasks such as shopping or remembering to take medications? Independent  Patient Information Are you of Hispanic, Latino/a,or Spanish origin?: A. No, not of Hispanic, Latino/a, or Spanish origin (proxy) What is your race?: A. White Do you need or want an interpreter to communicate with a doctor or health care staff?: 0. No  Patient's Response To:  Health Literacy and Transportation Is the patient able to respond to  health literacy and transportation needs?: No Health Literacy - How often do you need to have someone help you when you read instructions, pamphlets, or other written material from your doctor or pharmacy?: Patient unable to respond In the past 12 months, has lack of transportation kept you from medical appointments or from getting medications?: No (proxy) In the past 12 months, has lack of transportation kept you from meetings, work, or from getting things needed for daily living?: No  Journalist, newspaper / Equipment Home Equipment: None  Prior Device Use: Indicate devices/aids used by the patient prior to current illness, exacerbation or injury? None of the above  Current Functional Level Cognition  Arousal/Alertness: Awake/alert Overall Cognitive Status: Difficult to assess (aphasia) Orientation Level: Other (comment) (UTA) Attention: Sustained Sustained Attention: Appears intact (sustained attention to self-feeding task)    Extremity Assessment (includes Sensation/Coordination)  Upper Extremity Assessment: Overall WFL for tasks assessed   Lower Extremity Assessment: Defer to PT evaluation    ADLs  Overall ADL's : Needs assistance/impaired Grooming: Minimal assistance, Cueing for sequencing, Standing Grooming Details (indicate cue type and reason): to initiate; undershooting noted Upper Body Bathing: Minimal assistance, Standing, Cueing for sequencing Lower Body Bathing: Minimal assistance, Cueing for sequencing Lower Body Dressing: Cueing for sequencing, Sitting/lateral leans, Minimal assistance Lower Body Dressing Details (indicate cue type and reason): set up after provided mod multimodal cues to overcome difficulty with language. once pt aware of task OT wants pt to perform pt doing so without difficulty Toilet Transfer: Contact guard assist, Ambulation Toilet Transfer Details (indicate cue type and reason): for guidance of direction Toileting- Clothing Manipulation and Hygiene:  (condom cath) Functional mobility during ADLs: Minimal assistance General ADL Comments: pt needs constant guidance for next task and with very limited self initiation of any mobility    Mobility  Overal bed mobility: Modified Independent Bed Mobility: Supine to Sit Supine to sit: Min assist General bed mobility comments: minA to initiate mobility due to impaired command following    Transfers  Overall transfer level: Needs assistance Equipment used: None Transfers: Sit to/from Stand Sit to Stand: Supervision General transfer comment: followed gestural cue    Ambulation / Gait / Stairs / Psychologist, prison and probation services  Ambulation/Gait Ambulation/Gait assistance: Chief Operating Officer (Feet): 200 Feet Assistive device: None Gait Pattern/deviations: WFL(Within Functional Limits) General Gait Details: steady step-through gait Gait velocity: functional Gait velocity interpretation: >2.62 ft/sec, indicative of community ambulatory Stairs: Yes Stairs assistance: Modified independent (Device/Increase time) Stair Management: Two rails,  Alternating pattern, Step to pattern, Forwards Number of Stairs: 5 General stair comments: pt could not understand/return demonstrate using no rails    Posture / Balance Balance Overall balance assessment: Mild deficits observed, not formally tested Standardized Balance Assessment Standardized Balance Assessment : Berg Balance Test, Dynamic Gait Index Berg Balance Test Sit to Stand: Able to stand without using hands and stabilize independently Standing Unsupported: Able to stand safely 2 minutes Sitting with Back Unsupported but Feet Supported on Floor or Stool: Able to sit safely and securely 2 minutes Stand to Sit: Sits safely with minimal use of hands Transfers: Able to transfer safely, minor use of hands Standing Unsupported with Eyes Closed:  (could not understand to close his eyes despite visual and verbal cues) Standing Ubsupported with Feet Together: Able to place feet together independently and stand 1 minute safely From Standing Position, Pick up Object from Floor:  (could not understand despite demonstration) From Standing Position, Turn to Look Behind Over each Shoulder:  (could not  understand despite demonstration) Turn 360 Degrees: Able to turn 360 degrees safely in 4 seconds or less Standing Unsupported, Alternately Place Feet on Step/Stool:  (could not understand despite demonstration) Standing Unsupported, One Foot in Front: Able to place foot tandem independently and hold 30 seconds Standing on One Leg:  (could not understand despite demonstration) Dynamic Gait Index Level Surface: Normal Change in Gait Speed:  (could not understand despite demonstration) Gait with Horizontal Head Turns: Normal Gait with Vertical Head Turns:  (could not understand despite demonstration) Gait and Pivot Turn: Normal Step Over Obstacle:  (could not understand despite demonstration) Step Around Obstacles: Normal Steps: Mild Impairment    Special needs/care consideration Special service  needs Pt. Very aphasic     Previous Home Environment (from acute therapy documentation) Living Arrangements: Spouse/significant other, Children  Lives With: Spouse Available Help at Discharge: Family Type of Home: House Home Layout: One level Home Access: Stairs to enter Secretary/administrator of Steps: 2 Bathroom Shower/Tub: Health visitor: Standard Bathroom Accessibility: Yes How Accessible: Accessible via walker Additional Comments: sister present but unable to state whether pt has DME at home as she lives out of town.  Discharge Living Setting Plans for Discharge Living Setting: House Type of Home at Discharge: House Discharge Home Layout: One level Discharge Home Access: Stairs to enter Entrance Stairs-Rails: Left, Right Entrance Stairs-Number of Steps: 2 Discharge Bathroom Shower/Tub: Tub/shower unit Discharge Bathroom Toilet: Handicapped height Discharge Bathroom Accessibility: Yes How Accessible: Accessible via walker Does the patient have any problems obtaining your medications?: No  Social/Family/Support Systems Patient Roles: Spouse Contact Information: [phone] Anticipated Caregiver: Prudence Davidson Anticipated Caregiver's Contact Information: Supervision Caregiver Availability: 24/7 Discharge Plan Discussed with Primary Caregiver: Yes Is Caregiver In Agreement with Plan?: Yes   Goals Patient/Family Goal for Rehab: OT/SLP Supervision Expected length of stay: 5-7 days Pt/Family Agrees to Admission and willing to participate: Yes Program Orientation Provided & Reviewed with Pt/Caregiver Including Roles  & Responsibilities: Yes   Decrease burden of Care through IP rehab admission: not anticipated   Possible need for SNF placement upon discharge:not anticipated   Patient Condition: This patient's condition remains as documented in the consult dated 10/01/23, in which the Rehabilitation Physician determined and documented that the patient's  condition is appropriate for intensive rehabilitative care in an inpatient rehabilitation facility. Will admit to inpatient rehab today.  Preadmission Screen Completed By:  Jeronimo Greaves, CCC-SLP, 10/01/2023 2:11 PM ______________________________________________________________________   Discussed status with Dr. Riley Kill on 10/01/23 at 900 and received approval for admission today.  Admission Coordinator:  Jeronimo Greaves, time 1417/Date 10/01/23

## 2023-10-01 NOTE — TOC Transition Note (Signed)
 Transition of Care [facility]) - Discharge Note   Patient Details  Name: Howard Johnson MRN: [id number] Date of Birth: 1955/10/22  [facility] CM/SW Contact:  Kermit Balo, RN Phone Number: 10/01/2023, 2:46 PM   Clinical Narrative:     Patient is discharging to CIR today. CM asked bedside RN to send aide form with pt for HTA to assist with caregivers after d/c from CIR. No further needs per TOC.    Final next level of care: IP Rehab Facility Barriers to Discharge: No Barriers Identified   Patient Goals and CMS Choice   CMS Medicare.gov Compare Post Acute Care list provided to:: Patient Represenative (must comment) Choice offered to / list presented to : Adult Children, Spouse      Discharge Placement                       Discharge Plan and Services Additional resources added to the After Visit Summary for     Discharge Planning Services: CM Consult Post Acute Care Choice: IP Rehab                      [facility] Agency: [facility] Date [facility] Agency Contacted: 09/29/23 Time HH Agency Contacted: 1450 Representative spoke with at [facility] Agency: Kandee Keen  Social Drivers of Health (SDOH) Interventions SDOH Screenings   Tobacco Use: Low Risk  (09/26/2023)     Readmission Risk Interventions     No data to display

## 2023-10-01 NOTE — H&P (Shared)
Physical Medicine and Rehabilitation Admission H&P    Chief Complaint  Patient presents with   Functional deficits due to SAH/Venous thrombosis    HPI:  Howard Johnson is a 68 year old male with history of T2DM, HTN, CKD, recent issues with cough and facial pain felt to be due to sinusitis and treated with Augmentin and steriods. He was admitted on 09/26/23 with confusion and reports of agitation with abnormal speech. He had word salad, was tachycardic, had leucocytosis-WBC 15.9, lactic acidosis-2.9 , SCr-1.68 and hyperglycemic with [id number]. Head CT showed small foci of hemorrhage in left temporal lobe. 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 CTA negative for LVO.  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 He was started on IV heparin per stroke protocol.  Carotid dopplers were negative for ICA stenosis. 2D echo showed EF 65-70% with no wall abnormality and DD not evaluated due to A fib. CT chest/abdomen/pelvis was negative for malignancy but showed intraluminal density within bladder clinical correlation recommended and BPH. UDS negative. Respiratory panel, HIV  and blood cultures negative.  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  EEG showed moderate to severe diffuse slowing indicated of global cerebral dysfunction and no seizures noted. He was transitioned to Eliquis on 02/15. Serial CT head showed stable hemorrhage with mild increase in surrounding edema. Blood sugars improving with input from diabetes coordinator.  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 Therapy has been working with patient who requires supervision to min assist. He was independent PTA and CIR recommended due to functional decline.   PTA: He and son walk about 3 miles daily weather permitting. 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     Review of Systems  Unable to perform ROS: Language  Eyes:  Positive for double vision.       Family feels that he's reporting double vision. Wears contacts (family has taken these out).   Musculoskeletal:  Negative for joint pain.       Had problems with right knee last  year--was aspirated twice.   Neurological:  Positive for speech change and headaches.    Past Medical History:  Diagnosis Date   Achilles tendinitis    Left   Diabetes mellitus without complication (HCC)    Elevated PSA    Hyperlipidemia    Hypertension    Right knee pain    Shoulder pain, bilateral     Family History  Problem Relation Age of Onset   Dementia Mother    Lung cancer Father    Diabetes Sister     Social History:  Married. Independent and was working PTA.  Per reports that he has never smoked but exposed to second hand smoke during his childhood. He has never used smokeless tobacco. Per reports he does not drink alcohol and does not use drugs.   Allergies  Allergen Reactions   Tape Rash    Medications Prior to Admission  Medication Sig Dispense Refill   amLODipine (NORVASC) 5 MG tablet  Take 5 mg by mouth daily.     amoxicillin-clavulanate (AUGMENTIN) 500-125 MG tablet Take 500 mg by mouth 3 (three) times daily.     FARXIGA 10 MG TABS tablet Take 10 mg by mouth daily.     guaifenesin (ROBITUSSIN) 100 MG/5ML syrup Take 200 mg by mouth 3 (three) times daily as needed for cough.     lisinopril (ZESTRIL) 20 MG tablet Take 20 mg by mouth 2 (two) times daily.     rosuvastatin (CRESTOR) 10 MG tablet Take 10 mg by  mouth daily.     RYBELSUS 7 MG TABS Take 7 mg by mouth daily.     predniSONE (DELTASONE) 10 MG tablet Take 10 mg by mouth daily with breakfast.      Home: Home Living Family/patient expects to be discharged to:: Private residence Living Arrangements: Spouse/significant other, Children Available Help at Discharge: Family Type of Home: House Home Access: Stairs to enter Secretary/administrator of Steps: 2 Home Layout: One level Bathroom Shower/Tub: Health visitor: Standard Bathroom Accessibility: Yes Home Equipment: None Additional Comments: sister present but unable to state whether pt has DME at home as she lives out of town.  Lives With: Spouse   Functional History: Prior Function Prior Level of Function : Independent/Modified Independent, Working/employed, Driving  Functional Status:  Mobility: Bed Mobility Overal bed mobility: Modified Independent Bed Mobility: Supine to Sit Supine to sit: Min assist General bed mobility comments: minA to initiate mobility due to impaired command following Transfers Overall transfer level: Needs assistance Equipment used: None Transfers: Sit to/from Stand Sit to Stand: Supervision General transfer comment: followed gestural cue Ambulation/Gait Ambulation/Gait assistance: Supervision Gait Distance (Feet): 200 Feet Assistive device: None Gait Pattern/deviations: WFL(Within Functional Limits) General Gait Details: steady step-through gait Gait velocity: functional Gait velocity interpretation: >2.62 ft/sec, indicative of community ambulatory Stairs: Yes Stairs assistance: Modified independent (Device/Increase time) Stair Management: Two rails, Alternating pattern, Step to pattern, Forwards Number of Stairs: 5 General stair comments: pt could not understand/return demonstrate using no rails    ADL: ADL Overall ADL's : Needs assistance/impaired Grooming: Minimal assistance, Cueing for sequencing, Standing Grooming  Details (indicate cue type and reason): to initiate; undershooting noted Upper Body Bathing: Minimal assistance, Standing, Cueing for sequencing Lower Body Bathing: Minimal assistance, Cueing for sequencing Lower Body Dressing: Cueing for sequencing, Sitting/lateral leans, Minimal assistance Lower Body Dressing Details (indicate cue type and reason): set up after provided mod multimodal cues to overcome difficulty with language. once pt aware of task OT wants pt to perform pt doing so without difficulty Toilet Transfer: Contact guard assist, Ambulation Toilet Transfer Details (indicate cue type and reason): for guidance of direction Toileting- Clothing Manipulation and Hygiene:  (condom cath) Functional mobility during ADLs: Minimal assistance General ADL Comments: pt needs constant guidance for next task and with very limited self initiation of any mobility  Cognition: Cognition Overall Cognitive Status: Difficult to assess (aphasia) Arousal/Alertness: Awake/alert Orientation Level: Other (comment) (UTA) Attention: Sustained Sustained Attention: Appears intact (sustained attention to self-feeding task) Cognition Arousal: Alert Behavior During Therapy: WFL for tasks assessed/performed Overall Cognitive Status: Difficult to assess (aphasia)   Blood pressure (!) 146/88, pulse 90, temperature 97.9 F (36.6 C), temperature source Oral, resp. rate 17, height 6\' 4"  (1.93 m), weight 92.2 kg, SpO2 95%. Physical Exam Vitals and nursing note reviewed.  Constitutional:      Appearance: Normal appearance.  Neurological:     Mental Status: He is alert.     Comments: Oriented to  self. Language very limited to numbers mostly one and two. Global aphasia with jargon. He was unable to follow simple motor commands without multimodal visual and tactile cues. Question visual deficits --diplopia per family but question mild right inattention?      Results for orders placed or performed during the  hospital encounter of 09/26/23 (from the past 48 hours)  Glucose, capillary     Status: Abnormal   Collection Time: 09/29/23  9:00 PM  Result Value Ref Range   Glucose-Capillary 179 (H) 70 - 99 mg/dL    Comment: Glucose reference range applies only to samples taken after fasting for at least 8 hours.  Glucose, capillary     Status: Abnormal   Collection Time: 09/30/23  6:18 AM  Result Value Ref Range   Glucose-Capillary 206 (H) 70 - 99 mg/dL    Comment: Glucose reference range applies only to samples taken after fasting for at least 8 hours.  Basic metabolic panel     Status: Abnormal   Collection Time: 09/30/23  6:34 AM  Result Value Ref Range   Sodium 137 135 - 145 mmol/L   Potassium 3.5 3.5 - 5.1 mmol/L   Chloride 105 98 - 111 mmol/L   CO2 20 (L) 22 - 32 mmol/L   Glucose, Bld 199 (H) 70 - 99 mg/dL    Comment: Glucose reference range applies only to samples taken after fasting for at least 8 hours.   BUN 24 (H) 8 - 23 mg/dL   Creatinine, Ser 5.40 (H) 0.61 - 1.24 mg/dL   Calcium 9.1 8.9 - 98.1 mg/dL   GFR, Estimated 58 (L) >60 mL/min    Comment: (NOTE) Calculated using the CKD-EPI Creatinine Equation (2021)    Anion gap 12 5 - 15    Comment: Performed at [facility], [address] [place], [place] [address]  CBC     Status: Abnormal   Collection Time: 09/30/23  6:34 AM  Result Value Ref Range   WBC 11.0 (H) 4.0 - 10.5 K/uL   RBC 5.42 4.22 - 5.81 MIL/uL   Hemoglobin 15.1 13.0 - 17.0 g/dL   HCT 82.9 56.2 - 13.0 %   MCV 81.2 80.0 - 100.0 fL   MCH 27.9 26.0 - 34.0 pg   MCHC 34.3 30.0 - 36.0 g/dL   RDW 86.5 78.4 - 69.6 %   Platelets 238 150 - 400 K/uL   nRBC 0.0 0.0 - 0.2 %    Comment: Performed at [facility], [address] [place], [place] [address]  Glucose, capillary     Status: Abnormal   Collection Time: 09/30/23  8:02 AM  Result Value Ref Range   Glucose-Capillary 206 (H) 70 - 99 mg/dL    Comment: Glucose reference range applies only to samples  taken after fasting for at least 8 hours.   Comment 1 Notify RN    Comment 2 Document in Chart   Glucose, capillary     Status: Abnormal   Collection Time: 09/30/23 12:08 PM  Result Value Ref Range   Glucose-Capillary 293 (H) 70 - 99 mg/dL    Comment: Glucose reference range applies only to samples taken after fasting for at least 8 hours.   Comment 1 Notify RN    Comment 2 Document in Chart   Hemoglobin A1c     Status: Abnormal   Collection Time: 09/30/23 12:25 PM  Result Value Ref Range   Hgb A1c MFr Bld 12.4 (H) 4.8 -  5.6 %    Comment: (NOTE) Pre diabetes:          5.7%-6.4%  Diabetes:              >6.4%  Glycemic control for   <7.0% adults with diabetes    Mean Plasma Glucose 309.18 mg/dL    Comment: Performed at [facility], [address], [place], [place] [address]  Glucose, capillary     Status: Abnormal   Collection Time: 09/30/23  4:10 PM  Result Value Ref Range   Glucose-Capillary 275 (H) 70 - 99 mg/dL    Comment: Glucose reference range applies only to samples taken after fasting for at least 8 hours.   Comment 1 Notify RN    Comment 2 Document in Chart   Glucose, capillary     Status: Abnormal   Collection Time: 09/30/23  9:09 PM  Result Value Ref Range   Glucose-Capillary 151 (H) 70 - 99 mg/dL    Comment: Glucose reference range applies only to samples taken after fasting for at least 8 hours.   Comment 1 Notify RN    Comment 2 Document in Chart   Basic metabolic panel     Status: Abnormal   Collection Time: 10/01/23  6:15 AM  Result Value Ref Range   Sodium 137 135 - 145 mmol/L   Potassium 3.5 3.5 - 5.1 mmol/L   Chloride 104 98 - 111 mmol/L   CO2 21 (L) 22 - 32 mmol/L   Glucose, Bld 157 (H) 70 - 99 mg/dL    Comment: Glucose reference range applies only to samples taken after fasting for at least 8 hours.   BUN 24 (H) 8 - 23 mg/dL   Creatinine, Ser 6.04 (H) 0.61 - 1.24 mg/dL   Calcium 9.2 8.9 - 54.0 mg/dL   GFR, Estimated >98 >11 mL/min     Comment: (NOTE) Calculated using the CKD-EPI Creatinine Equation (2021)    Anion gap 12 5 - 15    Comment: Performed at [facility], [address], [place], [place] [address]  CBC     Status: Abnormal   Collection Time: 10/01/23  6:15 AM  Result Value Ref Range   WBC 11.4 (H) 4.0 - 10.5 K/uL   RBC 5.61 4.22 - 5.81 MIL/uL   Hemoglobin 15.3 13.0 - 17.0 g/dL   HCT 29.5 62.1 - 30.8 %   MCV 80.9 80.0 - 100.0 fL   MCH 27.3 26.0 - 34.0 pg   MCHC 33.7 30.0 - 36.0 g/dL   RDW 65.7 84.6 - 96.2 %   Platelets 262 150 - 400 K/uL   nRBC 0.0 0.0 - 0.2 %    Comment: Performed at [facility], [address], [place], [place] [address]  Glucose, capillary     Status: Abnormal   Collection Time: 10/01/23  6:21 AM  Result Value Ref Range   Glucose-Capillary 175 (H) 70 - 99 mg/dL    Comment: Glucose reference range applies only to samples taken after fasting for at least 8 hours.   Comment 1 Notify RN    Comment 2 Document in Chart   Glucose, capillary     Status: Abnormal   Collection Time: 10/01/23 11:52 AM  Result Value Ref Range   Glucose-Capillary 194 (H) 70 - 99 mg/dL    Comment: Glucose reference range applies only to samples taken after fasting for at least 8 hours.   No results found.  Blood pressure (!) 146/88, pulse 90, temperature 97.9 F (36.6 C), temperature source Oral, resp. rate 17, height 6\' 4"  (1.93 m), weight 92.2 kg, SpO2 95%.  Medical Problem List and Plan: 1. Functional deficits secondary to ***  -patient may *** shower  -ELOS/Goals: *** 2.  Antithrombotics: -DVT/anticoagulation:  Pharmaceutical: Eliquis  -antiplatelet therapy: N/A 3. Pain Management: Tylenol prn for pain 4. Mood/Behavior/Sleep: LCSW to follow for evaluation and support.   -antipsychotic agents: Seroquel prn for agitation.  5. Neuropsych/cognition: This patient is not capable of making decisions on his own behalf. 6. Skin/Wound Care: Routine pressure relief measures.  7.  Fluids/Electrolytes/Nutrition: Monitor I/O. Check CMET in am 8. HTN: Monitor BP TID. BP improving. Continue to hold Norvasc.  9. T2DM: Hgb A1c-12.4. Add CM restrictions to diet. Was on Farxiga and Rybelsus PTA.  --Monitor BS ac/hs and use SSI for elevated BS.  --Continue Insulin glargine with 3 units TID AC for meal coverage. Was on prednisone for sinus infection.  10. Leucocytosis Monitor for fevers and other signs of infection. WBC down to 11 range.  --sinus infection was treated with Augmentin thru 09/29/23.  11. CKD: BUN elevated--encourage fluid intake. SCr trending down from 1.68-->1.26.  12. Constipation: Finally had BM last night and another after lunch. --Continue Senna 2 daily in am.  13. Headaches/Facial pain?:  Rhinocort and Claritin added for facial pain/congestion. Will schedule tylenol bid.      ***  Jacquelynn Cree, PA-C 10/01/2023

## 2023-10-01 NOTE — Consult Note (Signed)
 Physical Medicine and Rehabilitation Consult Reason for Consult:impaired cognition/language/functional mobility Referring Physician: Stroke team   HPI: Howard Johnson is a 68 y.o. male with hx of DM, HTN who presented on 09/26/23 with confusion,agitation, abnormal speech, ?URI. W/u positive for leukocytosis. CXR + for bilateral interstitial thickening which was felt to be chronic. CT of head revealed a small focus of acute hemorrhage within the left temporal lobe. 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 Pt was placed on eliquis by neurology.  Pt was up with PT yesterday and was supervision for sit-std transfers and 200' ambulation without an AD. He required min assist for basic ADL's. 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 He's on regular diet currently. Pt lives with spouse in a one level house with 2 steps to enter. He was independent and driving PTA.   Review of Systems  Unable to perform ROS: Language   Past Medical History:  Diagnosis Date   Diabetes mellitus without complication (HCC)    Hyperlipidemia    Hypertension    Past Surgical History:  Procedure Laterality Date   TONSILLECTOMY     History reviewed. No pertinent family history. Social History:  reports that he has never smoked. He has never used smokeless tobacco. He reports that he does not drink alcohol and does not use drugs. Allergies:  Allergies  Allergen Reactions   Tape Rash   Medications Prior to Admission  Medication Sig Dispense Refill   amLODipine (NORVASC) 5 MG tablet Take 5 mg by mouth daily.     amoxicillin-clavulanate (AUGMENTIN) 500-125 MG tablet Take 500 mg by mouth 3 (three) times daily.     FARXIGA 10 MG TABS  tablet Take 10 mg by mouth daily.     guaifenesin (ROBITUSSIN) 100 MG/5ML syrup Take 200 mg by mouth 3 (three) times daily as needed for cough.     lisinopril (ZESTRIL) 20 MG tablet Take 20 mg by mouth 2 (two) times daily.     rosuvastatin (CRESTOR) 10 MG tablet Take 10 mg by mouth daily.     RYBELSUS 7 MG TABS Take 7 mg by mouth daily.     predniSONE (DELTASONE) 10 MG tablet Take 10 mg by mouth daily with breakfast.      Home: Home Living Family/patient expects to be discharged to:: Private residence Living Arrangements: Spouse/significant other, Children Available Help at Discharge: Family Type of Home: House Home Access: Stairs to enter Secretary/administrator of Steps: 2 Home Layout: One level Bathroom Shower/Tub: Health visitor: Standard Home Equipment: None Additional Comments: sister present but unable to state whether pt has DME at home as she lives out of town.  Lives With: Spouse  Functional History: Prior Function Prior Level of Function : Independent/Modified Independent, Working/employed, Driving Functional Status:  Mobility: Bed Mobility Overal bed mobility: Needs Assistance Bed Mobility: Supine to Sit Supine to sit: Min assist General bed mobility comments: minA to initiate mobility due to impaired command following Transfers Overall transfer level: Needs assistance Equipment used: None Transfers: Sit to/from Stand Sit to Stand: Supervision General transfer comment: followed gestural cue Ambulation/Gait Ambulation/Gait assistance: Supervision Gait Distance (Feet): 200 Feet Assistive device: None Gait Pattern/deviations: WFL(Within Functional Limits) General Gait Details: steady step-through gait Gait velocity: functional Gait  velocity interpretation: >2.62 ft/sec, indicative of community ambulatory Stairs: Yes Stairs assistance: Modified independent (Device/Increase time) Stair Management: Two rails, Alternating pattern, Step to pattern,  Forwards Number of Stairs: 5 General stair comments: pt could not understand/return demonstrate using no rails    ADL: ADL Overall ADL's : Needs assistance/impaired Grooming: Oral care, Minimal assistance, Standing Grooming Details (indicate cue type and reason): to initiate; undershooting noted Lower Body Dressing: Cueing for sequencing, Sitting/lateral leans, Minimal assistance Lower Body Dressing Details (indicate cue type and reason): set up after provided mod multimodal cues to overcome difficulty with language. once pt aware of task OT wants pt to perform pt doing so without difficulty Toilet Transfer: Minimal assistance, Ambulation Toilet Transfer Details (indicate cue type and reason): for guidance of direction Functional mobility during ADLs: Minimal assistance General ADL Comments: pt needs constant guidance for next task and with very limited self initiation of any mobility  Cognition: Cognition Overall Cognitive Status: Difficult to assess (aphasia) Arousal/Alertness: Awake/alert Orientation Level: Other (comment) (uta) Attention: Sustained Sustained Attention: Appears intact (sustained attention to self-feeding task) Cognition Arousal: Alert Behavior During Therapy: WFL for tasks assessed/performed Overall Cognitive Status: Difficult to assess (aphasia)  Blood pressure 139/86, pulse 91, temperature 98.5 F (36.9 C), temperature source Oral, resp. rate 17, height 6\' 4"  (1.93 m), weight 92.2 kg, SpO2 96%. Physical Exam Constitutional:      General: He is not in acute distress. HENT:     Head: Normocephalic.     Nose: Nose normal.     Mouth/Throat:     Mouth: Mucous membranes are moist.  Cardiovascular:     Rate and Rhythm: Normal rate.  Pulmonary:     Effort: Pulmonary effort is normal.  Abdominal:     Palpations: Abdomen is soft.  Musculoskeletal:        General: No swelling or tenderness. Normal range of motion.     Cervical back: Normal range of motion.   Skin:    General: Skin is warm and dry.  Neurological:     Mental Status: He is alert.     Comments: Pt is alert and attentive. Made eye contact immediately when I entered. Demonstates significant expressive and receptive language deficits. Perseverated on the word "2". Was able to shake hands and hold up 2 fingers with substantial physical cueing. Unable to identify basic objects in room. CN non-focal. Moves all 4 with what appears to be at least 4/5 strength. Sensory exam normal for pain in all 4 limbs. No limb ataxia or cerebellar signs. No abnormal tone appreciated.    Psychiatric:     Comments: Sl anxious and frustrated     Results for orders placed or performed during the hospital encounter of 09/26/23 (from the past 24 hours)  Glucose, capillary     Status: Abnormal   Collection Time: 09/30/23 12:08 PM  Result Value Ref Range   Glucose-Capillary 293 (H) 70 - 99 mg/dL   Comment 1 Notify RN    Comment 2 Document in Chart   Hemoglobin A1c     Status: Abnormal   Collection Time: 09/30/23 12:25 PM  Result Value Ref Range   Hgb A1c MFr Bld 12.4 (H) 4.8 - 5.6 %   Mean Plasma Glucose 309.18 mg/dL  Glucose, capillary     Status: Abnormal   Collection Time: 09/30/23  4:10 PM  Result Value Ref Range   Glucose-Capillary 275 (H) 70 - 99 mg/dL   Comment 1 Notify RN    Comment 2 Document  in Chart   Glucose, capillary     Status: Abnormal   Collection Time: 09/30/23  9:09 PM  Result Value Ref Range   Glucose-Capillary 151 (H) 70 - 99 mg/dL   Comment 1 Notify RN    Comment 2 Document in Chart   Basic metabolic panel     Status: Abnormal   Collection Time: 10/01/23  6:15 AM  Result Value Ref Range   Sodium 137 135 - 145 mmol/L   Potassium 3.5 3.5 - 5.1 mmol/L   Chloride 104 98 - 111 mmol/L   CO2 21 (L) 22 - 32 mmol/L   Glucose, Bld 157 (H) 70 - 99 mg/dL   BUN 24 (H) 8 - 23 mg/dL   Creatinine, Ser 6.57 (H) 0.61 - 1.24 mg/dL   Calcium 9.2 8.9 - 84.6 mg/dL   GFR, Estimated >96 >29  mL/min   Anion gap 12 5 - 15  CBC     Status: Abnormal   Collection Time: 10/01/23  6:15 AM  Result Value Ref Range   WBC 11.4 (H) 4.0 - 10.5 K/uL   RBC 5.61 4.22 - 5.81 MIL/uL   Hemoglobin 15.3 13.0 - 17.0 g/dL   HCT 52.8 41.3 - 24.4 %   MCV 80.9 80.0 - 100.0 fL   MCH 27.3 26.0 - 34.0 pg   MCHC 33.7 30.0 - 36.0 g/dL   RDW 01.0 27.2 - 53.6 %   Platelets 262 150 - 400 K/uL   nRBC 0.0 0.0 - 0.2 %  Glucose, capillary     Status: Abnormal   Collection Time: 10/01/23  6:21 AM  Result Value Ref Range   Glucose-Capillary 175 (H) 70 - 99 mg/dL   Comment 1 Notify RN    Comment 2 Document in Chart    VAS US CAROTID Result Date: 09/29/2023 Carotid Arterial Duplex Study Patient Name:  Howard Johnson  Date of Exam:   09/29/2023 Medical Rec #: [id number]    Accession #:    [id number] Date of Birth: Jul 23, 1956   Patient Gender: M Patient Age:   32 years Exam Location:  [facility] Procedure:      VAS US CAROTID Referring Phys: Marvel Plan --------------------------------------------------------------------------------  Indications:       Speech disturbance. Risk Factors:      Hypertension, hyperlipidemia, Diabetes. Other Factors:     Dural venous sinus thrombosis as well as a subarachnoid                    hemorrhage in the left temporal lobe. Comparison Study:  No prior study on file Performing Technologist: Sherren Kerns RVS  Examination Guidelines: A complete evaluation includes B-mode imaging, spectral Doppler, color Doppler, and power Doppler as needed of all accessible portions of each vessel. Bilateral testing is considered an integral part of a complete examination. Limited examinations for reoccurring indications may be performed as noted.  Right Carotid Findings: +----------+--------+--------+--------+------------------+------------------+           PSV cm/sEDV cm/sStenosisPlaque DescriptionComments           +----------+--------+--------+--------+------------------+------------------+  CCA Prox  100     15                                                   +----------+--------+--------+--------+------------------+------------------+ CCA Distal119     14  intimal thickening +----------+--------+--------+--------+------------------+------------------+ ICA Prox  105     16              focal and calcificShadowing          +----------+--------+--------+--------+------------------+------------------+ ICA Mid   95      24                                                   +----------+--------+--------+--------+------------------+------------------+ ICA Distal94      23                                                   +----------+--------+--------+--------+------------------+------------------+ ECA       244     11                                                   +----------+--------+--------+--------+------------------+------------------+ +---------+--------+--+--------+--+ VertebralPSV cm/s70EDV cm/s14 +---------+--------+--+--------+--+  Left Carotid Findings: +----------+--------+--------+--------+------------------+------------------+           PSV cm/sEDV cm/sStenosisPlaque DescriptionComments           +----------+--------+--------+--------+------------------+------------------+ CCA Prox  128     19                                                   +----------+--------+--------+--------+------------------+------------------+ CCA Distal116     20                                intimal thickening +----------+--------+--------+--------+------------------+------------------+ ICA Prox  97      21              calcific                             +----------+--------+--------+--------+------------------+------------------+ ICA Mid   120     36                                                   +----------+--------+--------+--------+------------------+------------------+ ICA Distal109     32                                                    +----------+--------+--------+--------+------------------+------------------+ ECA       224     23                                                   +----------+--------+--------+--------+------------------+------------------+ +----------+--------+--------+--------+-------------------+  PSV cm/sEDV cm/sDescribeArm Pressure (mmHG) +----------+--------+--------+--------+-------------------+ Subclavian130                                         +----------+--------+--------+--------+-------------------+ +---------+--------+--+--------+-+ VertebralPSV cm/s42EDV cm/s7 +---------+--------+--+--------+-+   Summary: Right Carotid: Velocities in the right ICA are consistent with a 1-39% stenosis. Left Carotid: Velocities in the left ICA are consistent with a 1-39% stenosis. Vertebrals:  Bilateral vertebral arteries demonstrate antegrade flow. Subclavians: Right subclavian artery was not visualized. Normal flow              hemodynamics were seen in the left subclavian artery. *See table(s) above for measurements and observations.     Preliminary    CT HEAD WO CONTRAST ( ) Result Date: 09/29/2023 CLINICAL DATA:  Stroke, hemorrhagic EXAM: CT HEAD WITHOUT CONTRAST TECHNIQUE: Contiguous axial images were obtained from the base of the skull through the vertex without intravenous contrast. RADIATION DOSE REDUCTION: This exam was performed according to the departmental dose-optimization program which includes automated exposure control, adjustment of the mA and/or kV according to patient size and/or use of iterative reconstruction technique. COMPARISON:  CT September 27, 2023 FINDINGS: Brain: Similar size of the anterior left temporal lobe hemorrhage with mild increase in surrounding edema. No substantial change of mass effect. Vascular: No hyperdense vessel. Dural venous sinus thrombosis better seen on prior CTV. Skull: No acute fracture.  Sinuses/Orbits: Mostly clear sinuses.  No acute orbital findings. Other: No mastoid effusions. IMPRESSION: Similar size of the anterior left temporal lobe hemorrhage with mild increase in surrounding edema. No substantial change of mass effect. Electronically Signed   By: Feliberto Harts M.D.   On: 09/29/2023 15:49   EEG adult Result Date: 09/29/2023 Jefferson Fuel, MD     09/29/2023  1:11 PM Routine EEG Report Howard Johnson is a 68 y.o. male with a history of altered mental status who is undergoing an EEG to evaluate for seizures. Report: This EEG was acquired with electrodes placed according to the International 10-20 electrode system (including Fp1, Fp2, F3, F4, C3, C4, P3, P4, O1, O2, T3, T4, T5, T6, A1, A2, Fz, Cz, Pz). The following electrodes were missing or displaced: none. The occipital dominant rhythm was 4-5 Hz. This activity is reactive to stimulation. Drowsiness was manifested by background fragmentation; deeper stages of sleep were identified by K complexes and sleep spindles. There was no focal slowing. There were no interictal epileptiform discharges. There were no electrographic seizures identified. Photic stimulation and hyperventilation were not performed. Impression and clinical correlation: This EEG was obtained while awake and asleep and is abnormal due to moderate-to-severe diffuse slowing indicative of global cerebral dysfunction. Epileptiform abnormalities were not seen during this recording. Bing Neighbors, MD [facility] [phone] If 7pm- 7am, please page neurology on call as listed in AMION.    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  Does the need for close, 24 hr/day medical supervision in concert with the patient's rehab needs make it unreasonable for this patient to be served in a less intensive setting? Yes Co-Morbidities requiring supervision/potential complications:  -HTN -DM II  uncontrolled -bowel and bladder incontinence Due to bladder management, bowel management, safety, skin/wound care, disease management, medication administration, pain management, and patient education, does the patient require 24 hr/day rehab nursing? Yes Does the patient require coordinated care of a physician, rehab nurse, therapy disciplines  of PT, OT, SLP to address physical and functional deficits in the context of the above medical diagnosis(es)? Yes Addressing deficits in the following areas: balance, endurance, locomotion, strength, transferring, bowel/bladder control, bathing, dressing, feeding, grooming, toileting, cognition, language, and psychosocial support Can the patient actively participate in an intensive therapy program of at least 3 hrs of therapy per day at least 5 days per week? Yes The potential for patient to make measurable gains while on inpatient rehab is excellent Anticipated functional outcomes upon discharge from inpatient rehab are modified independent  with PT, supervision and min assist with OT, min assist and mod assist with SLP. Estimated rehab length of stay to reach the above functional goals is: 7 days Anticipated discharge destination: Home Overall Rehab/Functional Prognosis: excellent  POST ACUTE RECOMMENDATIONS: This patient's condition is appropriate for continued rehabilitative care in the following setting: CIR Patient has agreed to participate in recommended program. N/A Note that insurance prior authorization may be required for reimbursement for recommended care.  Comment: Pt is moving well but has severe global aphasia which will impact his safety with higher level mobility tasks and certainly his ability to complete basic ADL's, including toileting.  Wife can provide supervision only at home. Rehab Admissions Coordinator to follow up.      I have personally performed a face to face diagnostic evaluation of this patient. Additionally, I have  examined the patient's medical record including any pertinent labs and radiographic images.    Thanks,  Ranelle Oyster, MD 10/01/2023

## 2023-10-01 NOTE — Progress Notes (Signed)
 STROKE TEAM PROGRESS NOTE   INTERIM HISTORY/SUBJECTIVE Patient's sister is at the bedside.  Patient is Sitting in the bed in no apparent distress.  He is still having trouble with receptive aphasia however he is able to say more words today and almost put a nonsensical sentence together.  Labs and vitals are stable Therapy is recommending CIR however waiting for insurance approval and bed availability   OBJECTIVE  CBC    Component Value Date/Time   WBC 11.4 (H) 10/01/2023 0615   RBC 5.61 10/01/2023 0615   HGB 15.3 10/01/2023 0615   HGB 16.6 11/23/2020 1553   HCT 45.4 10/01/2023 0615   HCT 50.0 11/23/2020 1553   PLT 262 10/01/2023 0615   PLT 264 11/23/2020 1553   MCV 80.9 10/01/2023 0615   MCV 84 11/23/2020 1553   MCH 27.3 10/01/2023 0615   MCHC 33.7 10/01/2023 0615   RDW 14.3 10/01/2023 0615   RDW 13.5 11/23/2020 1553   LYMPHSABS 0.4 (L) 09/26/2023 1030   MONOABS 0.8 09/26/2023 1030   EOSABS 0.0 09/26/2023 1030   BASOSABS 0.1 09/26/2023 1030    BMET    Component Value Date/Time   NA 137 10/01/2023 0615   NA 139 11/23/2020 1553   K 3.5 10/01/2023 0615   CL 104 10/01/2023 0615   CO2 21 (L) 10/01/2023 0615   GLUCOSE 157 (H) 10/01/2023 0615   BUN 24 (H) 10/01/2023 0615   BUN 21 11/23/2020 1553   CREATININE 1.26 (H) 10/01/2023 0615   CALCIUM 9.2 10/01/2023 0615   EGFR 55 (L) 11/23/2020 1553   GFRNONAA >60 10/01/2023 0615    IMAGING past 24 hours No results found.    Vitals:   10/01/23 0017 10/01/23 0352 10/01/23 0813 10/01/23 1151  BP: (!) 140/84 130/82 139/86 (!) 146/88  Pulse: 77 75 91 90  Resp: 18 18 17 17   Temp: 98.7 F (37.1 C)  98.5 F (36.9 C) 97.9 F (36.6 C)  TempSrc: Oral  Oral Oral  SpO2: 96% 94% 96% 95%  Weight:      Height:         PHYSICAL EXAM General:  Alert, well-nourished, well-developed patient in no acute distress Psych:  Mood and affect appropriate for situation CV: Regular rate and rhythm on monitor Respiratory:  Regular,  unlabored respirations on supplemental O2 GI: Abdomen soft and nontender   NEURO:  Mental Status: Patient is alert and responds to voice.  He is able to say more words  today and is trying to put sentences together, however speech is nonsensical and still mostly responds with the answer "2".  He does not answer any questions appropriately and cannot comprehend what is being said to him.  He can mimic  Cranial Nerves:  II: PERRL.  III, IV, VI: EOMI. Eyelids elevate symmetrically.  V: Sensation is intact to light touch and symmetrical to face.  VII: Face is symmetrical resting and smiling VIII: hearing intact to voice. IX, X: Phonation is normal.  XII: tongue is midline without fasciculations. Motor: Able to move all 4 extremities with good antigravity strength Tone: is normal and bulk is normal Sensation- Intact to light touch bilaterally.  Coordination: Unable to perform Gait- deferred  Most Recent NIH 6   ASSESSMENT/PLAN  [name] is a 68 y.o. male with history of hypertension and hyperlipidemia admitted for several days of speech abnormalities.  He was found to have a small left temporal lobe hemorrhage as well as CVST through the left transverse and  sigmoid sinuses on CT venogram.  Progression of IPH was seen, and patient was admitted to ICU for close monitoring.  He is anticoagulated with heparin.  CT chest abdomen and pelvis reveals no occult malignancy. 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 is extending into left upper IJ, progression of patchy left temporal hematoma MRI asymmetrically decreased flow void in left transverse and sigmoid  sinus with filling defects in the left sigmoid sinus and proximal IJ, redemonstrated subarachnoid hemorrhage and left temporal lobe Repeat head CT 2/14 unchanged left temporal hematoma Repeat head CT 2/16 stable left temporal hematoma with increased surrounding edema EEG 2/16 no seizures or epileptiform discharges 2D Echo EF 65 to 70% Pan CT no malignancy LDL 54 HgbA1c 12.8 Hypercoagulable work up negative so far VTE prophylaxis - fully anticoagulated with Eliquis No antithrombotic prior to admission, now on Eliquis (apixaban) daily  Therapy recommendations: CIR Disposition: Pending  Hypertension Home meds: Amlodipine 10 mg daily, lisinopril 20 mg twice daily Stable but fluctuate BP goal less than 160 Long-term goal normotensive  Hyperlipidemia Home meds: Rosuvastatin 10 mg daily LDL 54, goal < 70 Resume Crestor 10 Continue statin at discharge  Diabetes type II Uncontrolled Home meds: Farxiga 10 mg daily, Rybelsus 7 mg daily HgbA1c 12.4, goal < 7.0 Semglee 18 units daily And insulin aspart 3 units with meals CBGs SSI Diabetes coordinator consult - will need insulin at home - wife and son educated on insulin injection  Recommend close follow-up with PCP for better DM control  Other Stroke Risk Factors Advanced age  Other Active Problems Recent sinusitis-continue Augmentin  Hospital day # 5  Patient seen by NP with MD, MD to edit note as needed.   Gevena Mart DNP, ACNPC-AG  [facility]        To contact Stroke Continuity provider, please refer to [email or web address]. After hours, contact General Neurology

## 2023-10-01 NOTE — Progress Notes (Signed)
 Occupational Therapy Treatment Patient Details Name: Howard Johnson MRN: [id number] DOB: 1956/01/12 Today's Date: 10/01/2023   History of present illness 68 yo male recently dx with sinusitis from home with AMS. 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 CT venogram (+) temporal lobe hematoma. PMH Dm2, HTn HLD CKDIII   OT comments  Alert and cooperative throughout session. Pt making steady progress however requires assistance with ADL tasks due to deficits listed below. Pt appears apraxic and requires hand over hand at times and backward chaining.to initiate and sequence tasks. Patient will benefit from intensive inpatient follow-up therapy, >3 hours/day to maximize functional level of independence to facilitate a safe DC home with his supportive family. Pt very appreciative. Recommending trialing use of urinal with offering pt to go to the bathroom q 2 hours. Acute OT will continue to follow.       If plan is discharge home, recommend the following:  A little help with bathing/dressing/bathroom;Assistance with cooking/housework;Direct supervision/assist for medications management;Direct supervision/assist for financial management;Assist for transportation   Equipment Recommendations  None recommended by OT    Recommendations for Other Services      Precautions / Restrictions Precautions Precaution/Restrictions Comments: expressive and receptive aphasia       Mobility Bed Mobility Overal bed mobility: Modified Independent                  Transfers Overall transfer level: Needs assistance   Transfers: Sit to/from Stand Sit to Stand: Supervision                 Balance Overall balance assessment: Mild deficits observed, not formally tested                                         ADL either performed or assessed with clinical judgement   ADL Overall ADL's : Needs assistance/impaired     Grooming: Minimal  assistance;Cueing for sequencing;Standing   Upper Body Bathing: Minimal assistance;Standing;Cueing for sequencing   Lower Body Bathing: Minimal assistance;Cueing for sequencing           Toilet Transfer: Contact guard assist;Ambulation   Toileting- Clothing Manipulation and Hygiene:  (condom cath)       Functional mobility during ADLs: Minimal assistance      Extremity/Trunk Assessment Upper Extremity Assessment Upper Extremity Assessment: Overall WFL for tasks assessed   Lower Extremity Assessment Lower Extremity Assessment: Defer to PT evaluation        Vision   Additional Comments: wears glasses; turning to page in [place] and trying to apparently communicate that he can not read; read titla of magazine   Perception Perception Perception: Within Functional Limits   Praxis Praxis Praxis: Impaired Praxis Impairment Details: Ideation;Perseveration;Motor planning Praxis-Other Comments: difficulty initiatinghowever once task inititaed able to complete task; difficulty with sequencing; does well with hand over hand to initiate and fading tactile cues   Communication Communication Communication: Impaired Factors Affecting Communication: Difficulty expressing self   Cognition Arousal: Alert Behavior During Therapy: [facility] for tasks assessed/performed Cognition: Difficult to assess Difficult to assess due to: Impaired communication                             Following commands: Impaired Following commands impaired: Follows one step commands inconsistently      Cueing   Cueing Techniques: Gestural cues, Tactile cues,  Visual cues  Exercises      Shoulder Instructions       General Comments sister present and very supportive    Pertinent Vitals/ Pain       Pain Assessment Pain Assessment: Faces Faces Pain Scale: No hurt  Home Living                                          Prior Functioning/Environment               Frequency  Min 1X/week        Progress Toward Goals  OT Goals(current goals can now be found in the care plan section)  Progress towards OT goals: Not progressing toward goals - comment  Acute Rehab OT Goals Patient Stated Goal: per family to get some rehab OT Goal Formulation: With patient Time For Goal Achievement: 10/12/23 Potential to Achieve Goals: Good ADL Goals Pt Will Perform Grooming: standing;with modified independence Pt Will Perform Lower Body Dressing: with modified independence;sit to/from stand Pt Will Transfer to Toilet: with modified independence;ambulating Additional ADL Goal #1: Pt will follow one step commands consistently with min cues Additional ADL Goal #2: OT to continue to assess cognition  Plan      Co-evaluation                 AM-PAC OT "6 Clicks" Daily Activity     Outcome Measure   Help from another person eating meals?: None Help from another person taking care of personal grooming?: A Little Help from another person toileting, which includes using toliet, bedpan, or urinal?: A Little Help from another person bathing (including washing, rinsing, drying)?: A Little Help from another person to put on and taking off regular upper body clothing?: A Little Help from another person to put on and taking off regular lower body clothing?: A Little 6 Click Score: 19    End of Session Equipment Utilized During Treatment: Gait belt  OT Visit Diagnosis: Cognitive communication deficit (R41.841);Other symptoms and signs involving cognitive function Symptoms and signs involving cognitive functions: Cerebral infarction   Activity Tolerance Patient tolerated treatment well   Patient Left in bed;with call bell/phone within reach;with bed alarm set   Nurse Communication Mobility status        Time: 1005-1030 OT Time Calculation (min): 25 min  Charges: OT General Charges $OT Visit: 1 Visit OT Treatments $Self Care/Home Management :  23-37 mins  Luisa Dago, OT/L   Acute OT Clinical Specialist Acute Rehabilitation Services Pager [phone] Office [phone]   [facility] 10/01/2023, 11:53 AM

## 2023-10-02 DIAGNOSIS — G08 Intracranial and intraspinal phlebitis and thrombophlebitis: Secondary | ICD-10-CM

## 2023-10-02 DIAGNOSIS — E876 Hypokalemia: Secondary | ICD-10-CM | POA: Diagnosis not present

## 2023-10-02 DIAGNOSIS — I10 Essential (primary) hypertension: Secondary | ICD-10-CM

## 2023-10-02 DIAGNOSIS — E1169 Type 2 diabetes mellitus with other specified complication: Secondary | ICD-10-CM

## 2023-10-02 DIAGNOSIS — N189 Chronic kidney disease, unspecified: Secondary | ICD-10-CM

## 2023-10-02 DIAGNOSIS — Z794 Long term (current) use of insulin: Secondary | ICD-10-CM

## 2023-10-02 LAB — CBC WITH DIFFERENTIAL/PLATELET
Abs Immature Granulocytes: 0.05 10*3/uL (ref 0.00–0.07)
Basophils Absolute: 0.1 10*3/uL (ref 0.0–0.1)
Basophils Relative: 1 %
Eosinophils Absolute: 0.3 10*3/uL (ref 0.0–0.5)
Eosinophils Relative: 3 %
HCT: 45.2 % (ref 39.0–52.0)
Hemoglobin: 15 g/dL (ref 13.0–17.0)
Immature Granulocytes: 1 %
Lymphocytes Relative: 7 %
Lymphs Abs: 0.7 10*3/uL (ref 0.7–4.0)
MCH: 27.4 pg (ref 26.0–34.0)
MCHC: 33.2 g/dL (ref 30.0–36.0)
MCV: 82.6 fL (ref 80.0–100.0)
Monocytes Absolute: 0.8 10*3/uL (ref 0.1–1.0)
Monocytes Relative: 7 %
Neutro Abs: 9.2 10*3/uL — ABNORMAL HIGH (ref 1.7–7.7)
Neutrophils Relative %: 81 %
Platelets: 277 10*3/uL (ref 150–400)
RBC: 5.47 MIL/uL (ref 4.22–5.81)
RDW: 14.4 % (ref 11.5–15.5)
WBC: 11.1 10*3/uL — ABNORMAL HIGH (ref 4.0–10.5)
nRBC: 0 % (ref 0.0–0.2)

## 2023-10-02 LAB — COMPREHENSIVE METABOLIC PANEL
ALT: 24 U/L (ref 0–44)
AST: 18 U/L (ref 15–41)
Albumin: 2.7 g/dL — ABNORMAL LOW (ref 3.5–5.0)
Alkaline Phosphatase: 66 U/L (ref 38–126)
Anion gap: 8 (ref 5–15)
BUN: 27 mg/dL — ABNORMAL HIGH (ref 8–23)
CO2: 22 mmol/L (ref 22–32)
Calcium: 8.9 mg/dL (ref 8.9–10.3)
Chloride: 105 mmol/L (ref 98–111)
Creatinine, Ser: 1.72 mg/dL — ABNORMAL HIGH (ref 0.61–1.24)
GFR, Estimated: 42 mL/min — ABNORMAL LOW (ref >60)
Glucose, Bld: 156 mg/dL — ABNORMAL HIGH (ref 70–99)
Potassium: 3.3 mmol/L — ABNORMAL LOW (ref 3.5–5.1)
Sodium: 135 mmol/L (ref 135–145)
Total Bilirubin: 0.6 mg/dL (ref 0.0–1.2)
Total Protein: 5.5 g/dL — ABNORMAL LOW (ref 6.5–8.1)

## 2023-10-02 LAB — GLUCOSE, CAPILLARY
Glucose-Capillary: 153 mg/dL — ABNORMAL HIGH (ref 70–99)
Glucose-Capillary: 239 mg/dL — ABNORMAL HIGH (ref 70–99)
Glucose-Capillary: 86 mg/dL (ref 70–99)
Glucose-Capillary: 248 mg/dL — ABNORMAL HIGH (ref 70–99)

## 2023-10-02 MED ORDER — POTASSIUM CHLORIDE CRYS ER 20 MEQ PO TBCR
30.0000 meq | EXTENDED_RELEASE_TABLET | Freq: Once | ORAL | Status: AC
  Administered 2023-10-02: 30 meq via ORAL
  Filled 2023-10-02: qty 1

## 2023-10-02 MED ORDER — INSULIN GLARGINE-YFGN 100 UNIT/ML ~~LOC~~ SOLN
20.0000 [IU] | Freq: Every day | SUBCUTANEOUS | Status: DC
  Administered 2023-10-03 – 2023-10-08 (×6): 20 [IU] via SUBCUTANEOUS
  Filled 2023-10-02 (×6): qty 0.2

## 2023-10-02 MED ORDER — NYSTATIN 100000 UNIT/GM EX CREA
TOPICAL_CREAM | Freq: Two times a day (BID) | CUTANEOUS | Status: DC
  Filled 2023-10-02 (×2): qty 30

## 2023-10-02 NOTE — Evaluation (Addendum)
 Speech Language Pathology Assessment and Plan  Patient Details  Name: Howard Johnson MRN: [id number] Date of Birth: July 21, 1956  SLP Diagnosis: Aphasia;Apraxia;Speech and Language deficits  Rehab Potential: Fair ELOS: ~3-5 days    Today's Date: 10/02/2023 SLP Individual Time: 7829-5621 SLP Individual Time Calculation (min): 40 min   Hospital Problem: Principal Problem:   Cerebral venous sinus thrombosis  Past Medical History:  Past Medical History:  Diagnosis Date   Achilles tendinitis    Left   Diabetes mellitus without complication (HCC)    Elevated PSA    Hyperlipidemia    Hypertension    Right knee pain    Shoulder pain, bilateral    Past Surgical History:  Past Surgical History:  Procedure Laterality Date   TONSILLECTOMY      Assessment / Plan / Recommendation Clinical Impression Pt is a 68 year old male with history of T2DM, HTN, CKD, recent issues with cough and facial pain felt to be due to sinusitis and treated with Augmentin and steriods. He was admitted on 09/26/23 with confusion and reports of agitation with abnormal speech. He had word salad, was tachycardic, had leucocytosis-WBC 15.9, lactic acidosis-2.9 , SCr-1.68 and hyperglycemic with BS-454. Head CT showed small foci of hemorrhage in left temporal lobe. 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 CTA negative for LVO.  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 He was started on IV heparin per stroke protocol. EEG showed moderate to severe diffuse slowing indicated of global cerebral dysfunction and no seizures noted. He was transitioned to Eliquis on 02/15. Serial CT head showed stable hemorrhage with mild increase in surrounding edema. Blood sugars  improving with input from diabetes coordinator.  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 Therapy has been working with patient who requires supervision to min assist. He was independent PTA and CIR recommended due to functional decline.   Portions of the Bedside Western Aphasia Battery (WAB) were administered, however, functional tasks were provided as well d/t the severity of deficits. Pt presented w/ profound expressive and receptive deficits. 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 Anticipate that apraxia negatively impacted success during receptive language tasks and that comprehension deficits appeared worse than their true severity. However, pt would benefit from ongoing functional assessment of receptive language. Slightly improved success noted w/ reading comprehension vs auditory, as he presented w/ only severe deficits during functional reading comprehension tasks at word level (unscrambling automatics). Pt noted to answer "Y" for majority of responses via head nod and perseverated on numbers during verbal expression tasks. Many fluent yet nonsensical sentences throughout naming tasks (I.e. "it's not that heavy" for pen and "there's several, 6 or 7, then 12 by 2" for bed). No stimulability noted to verbal cues at this time and pt is unable to repeat. Slight success was noted w/ repeated visual cues. Pt noted to perseverate on numbers during written tasks as well. He was initially unable to write his name, but was able to write it with 100% accuracy on the third attempt. Overall, he presented with inconsistent and limited awareness of errors. He would benefit from skilled ST services to target expressive/receptive  language, facilitate improved communication of wants/needs, and maximize pt independence.   No dysphagia eval completed d/t priority of language eval  given severity of expressive/receptive deficits. Additionally, he was cleared for regular textures/thin liquids in acute care and discharged from dysphagia services.     Skilled Therapeutic Interventions          SLP facilitated functional and structured language eval tasks to assess pt's cognitive-communication skills and determine need for additional skilled ST services. See above for more information.    SLP Assessment  Patient will need skilled Speech Lanaguage Pathology Services during CIR admission    Recommendations  SLP Diet Recommendations: Age appropriate regular solids;Thin Liquid Administration via: Straw Medication Administration: Whole meds with liquid Supervision: Patient able to self feed Compensations: Slow rate;Small sips/bites Postural Changes and/or Swallow Maneuvers: Seated upright 90 degrees;Upright 30-60 min after meal Oral Care Recommendations: Oral care BID Recommendations for Other Services: Therapeutic Recreation consult Therapeutic Recreation Interventions: Pet therapy Patient destination: Home Follow up Recommendations: Outpatient SLP;24 hour supervision/assistance Equipment Recommended: None recommended by SLP    SLP Frequency 3 to 5 out of 7 days   SLP Duration  SLP Intensity  SLP Treatment/Interventions ~3-5 days  Minumum of 1-2 x/day, 30 to 90 minutes  Cueing hierarchy;Patient/family education;Multimodal communication approach;Speech/Language facilitation;Internal/external aids    Pain  No pain reported  Prior Functioning Cognitive/Linguistic Baseline: Within functional limits  SLP Evaluation Cognition Overall Cognitive Status: Difficult to assess Arousal/Alertness: Awake/alert Attention: Focused;Sustained Focused Attention: Appears intact Sustained Attention: Appears intact  Comprehension Auditory Comprehension Overall Auditory Comprehension: Impaired Yes/No Questions: Impaired Basic Biographical Questions: 51-75% accurate Basic  Immediate Environment Questions: 50-74% accurate Complex Questions: 0-24% accurate Commands: Impaired One Step Basic Commands: 0-24% accurate Two Step Basic Commands: 0-24% accurate Interfering Components: Motor planning EffectiveTechniques: Visual/Gestural cues Reading Comprehension Reading Status: Impaired Word level: Impaired Sentence Level: Unable to assess (comment) Paragraph Level: Unable to assess (comment) Functional Environmental (signs, name badge): Impaired Interfering Components: Visual perceptual Effective Techniques: Verbal cueing;Visual cueing Expression Expression Primary Mode of Expression: Verbal Verbal Expression Overall Verbal Expression: Impaired Initiation: No impairment Automatic Speech: Social Response Level of Generative/Spontaneous Verbalization: Word;Phrase Repetition: Impaired Level of Impairment: Word level Naming: Impairment Responsive: 0-25% accurate Confrontation: Impaired Other Naming Comments: 0-25% accurate Verbal Errors: Semantic paraphasias;Phonemic paraphasias;Jargon;Neologisms;Other (comment) (intermittently aware of errors) Pragmatics: No impairment Non-Verbal Means of Communication: Gestures Written Expression Dominant Hand: Right Written Expression: Exceptions to [facility] Oral Motor Oral Motor/Sensory Function Overall Oral Motor/Sensory Function: Within functional limits Motor Speech Overall Motor Speech: Impaired Articulation: Within functional limitis Intelligibility: Intelligible Motor Planning: Impaired Level of Impairment: Word Motor Speech Errors: Unaware;Aware  Care Tool Care Tool Cognition Ability to hear (with hearing aid or hearing appliances if normally used Ability to hear (with hearing aid or hearing appliances if normally used): 0. Adequate - no difficulty in normal conservation, social interaction, listening to TV   Expression of Ideas and Wants Expression of Ideas and Wants: 1. Rarely/Never expressess or very  difficult - rarely/never expresses self or speech is very difficult to understand   Understanding Verbal and Non-Verbal Content Understanding Verbal and Non-Verbal Content: 2. Sometimes understands - understands only basic conversations or simple, direct phrases. Frequently requires cues to understand  Memory/Recall Ability Memory/Recall Ability : None of the above were recalled   Motor Speech Assessment  Intelligibility: Intelligible   Short Term Goals: Week 1: SLP Short Term Goal 1 (Week 1): STGs = LTGs d/t ELOS  Refer to Care Plan for Long Term Goals  Recommendations for other services: Therapeutic Recreation  Pet therapy  Discharge Criteria: Patient will be discharged from SLP if patient refuses treatment 3 consecutive times without medical reason, if treatment goals not met, if there is a change in medical status, if patient makes no progress towards goals or if patient is discharged from hospital.  The above assessment, treatment plan, treatment alternatives and goals were discussed and mutually agreed upon: No family available/patient unable  Pati Gallo 10/02/2023, 4:21 PM

## 2023-10-02 NOTE — Plan of Care (Signed)
  Problem: Consults Goal: RH STROKE PATIENT EDUCATION Description: See Patient Education module for education specifics  Outcome: Progressing   Problem: RH BOWEL ELIMINATION Goal: RH STG MANAGE BOWEL WITH ASSISTANCE Description: STG Manage Bowel with toileting Assistance. Outcome: Progressing   Problem: RH BOWEL ELIMINATION Goal: RH STG MANAGE BOWEL W/MEDICATION W/ASSISTANCE Description: STG Manage Bowel with Medication with mod I  Assistance. Outcome: Progressing   Problem: RH BLADDER ELIMINATION Goal: RH STG MANAGE BLADDER WITH ASSISTANCE Description: STG Manage Bladder With toileting Assistance Outcome: Progressing   Problem: RH COGNITION-NURSING Goal: RH STG USES MEMORY AIDS/STRATEGIES W/ASSIST TO PROBLEM SOLVE Description: STG Uses Memory Aids/Strategies With cues Assistance to Problem Solve. Outcome: Progressing   Problem: RH SAFETY Goal: RH STG ADHERE TO SAFETY PRECAUTIONS W/ASSISTANCE/DEVICE Description: STG Adhere to Safety Precautions With cues Assistance/Device. Outcome: Progressing

## 2023-10-02 NOTE — Plan of Care (Signed)
  Problem: RH Expression Communication Goal: LTG Patient will express needs/wants via multi-modal(SLP) Description: LTG:  Patient will express needs/wants via multi-modal communication (gestures/written, etc) with cues (SLP) Flowsheets (Taken 10/02/2023 1650) LTG: Patient will express needs/wants via multimodal communication (gestures/written, etc) with cueing (SLP): Maximal Assistance - Patient 25 - 49% Goal: LTG Patient will increase word finding of common (SLP) Description: LTG:  Patient will increase word finding of common objects/daily info/abstract thoughts with cues using compensatory strategies (SLP). Flowsheets (Taken 10/02/2023 1650) LTG: Patient will increase word finding of common (SLP): Maximal Assistance - Patient 25 - 49% Note: Automatic naming tasks   Problem: RH Pre-functional/Other (Specify) Goal: RH LTG SLP (Specify) 1 Description: RH LTG SLP (Specify) 1 Flowsheets (Taken 10/02/2023 1650) LTG: Other SLP (Specify) 1: Pt will follow one step directions with 25% accuracy given maxA multimodal cues

## 2023-10-02 NOTE — Evaluation (Signed)
 Physical Therapy Assessment and Plan  Patient Details  Name: Howard Johnson MRN: [id number] Date of Birth: 28-Nov-1955  PT Diagnosis: Difficulty walking and Impaired cognition Rehab Potential: Good ELOS: 3-5 Days   Today's Date: 10/02/2023 PT Individual Time: 1255-1406 PT Individual Time Calculation (min): 71 min    Hospital Problem: Principal Problem:   Cerebral venous sinus thrombosis   Past Medical History:  Past Medical History:  Diagnosis Date   Achilles tendinitis    Left   Diabetes mellitus without complication (HCC)    Elevated PSA    Hyperlipidemia    Hypertension    Right knee pain    Shoulder pain, bilateral    Past Surgical History:  Past Surgical History:  Procedure Laterality Date   TONSILLECTOMY      Assessment & Plan Clinical Impression: Patient is a 68 y.o. male with hx of DM, HTN who presented on 09/26/23 with confusion,agitation, abnormal speech, ?URI. W/u positive for leukocytosis. CXR + for bilateral interstitial thickening which was felt to be chronic. CT of head revealed a small focus of acute hemorrhage within the left temporal lobe. MRI was then performed which demonstrated the [facility] and findings worrisome for dural venous sinus thrombosis. 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 Pt was placed on eliquis by neurology.  Pt was up with PT yesterday and was supervision for sit-std transfers and 200' ambulation without an AD. He required min assist for basic ADL's. 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 He's on regular diet currently. Pt lives with spouse in a one level house with 2 steps to enter. He was independent and driving PTA.   Patient transferred to CIR on 10/01/2023 .   Patient currently requires  CGA  with mobility secondary to motor apraxia and decreased motor planning,  decreased problem solving, and decreased balance strategies.  Prior to hospitalization, patient was independent  with mobility and lived with Spouse in a House home.  Home access is 2Stairs to enter.  Patient will benefit from skilled PT intervention to maximize safe functional mobility, minimize fall risk, and decrease caregiver burden for planned discharge home with 24 hour supervision.  Anticipate patient will not need PT follow up at discharge.  PT - End of Session Endurance Deficit: No   PT Evaluation Precautions/Restrictions Precautions Precautions: Fall Precaution/Restrictions Comments: expressive and receptive aphasia Restrictions Weight Bearing Restrictions Per Provider Order: No General Chart Reviewed: Yes Family/Caregiver Present: No  Pain Pain Assessment Pain Scale: Faces Pain Score: 0-No pain Pain Interference Pain Interference Pain Effect on Sleep: 8. Unable to answer (due to severe aphasia) Pain Interference with Therapy Activities: 8. Unable to answer Pain Interference with Day-to-Day Activities: 8. Unable to answer Home Living/Prior Functioning Home Living Available Help at Discharge: Family Type of Home: House Home Access: Stairs to enter Secretary/administrator of Steps: 2 Home Layout: One level Bathroom Shower/Tub: Health visitor: Standard Bathroom Accessibility: Yes Additional Comments: confirmed with son via phone  Lives With: Spouse Prior Function Level of Independence: Independent with basic ADLs;Independent with transfers;Independent with gait Driving: Yes Vocation: Full time employment Vision/Perception  Vision - History Ability to See in Adequate Light: 1 Impaired Vision - Assessment Eye Alignment: Within Functional Limits Ocular Range of Motion: Within Functional Limits Alignment/Gaze Preference: Within Defined Limits Tracking/Visual Pursuits: Able to track stimulus in all quads without difficulty Saccades: Within  functional limits Convergence: Impaired - to be further tested in functional context Diplopia Assessment: Other (comment) Additional Comments: Able to tell OT diplopia is occuring at times. He was mostly accurate during a BITS functional reaching assessment and no indication of field cut. Will continue to assess functionally Perception Perception: Within Functional Limits Praxis Praxis: Impaired Praxis Impairment Details: Motor planning  Cognition Overall Cognitive Status: Difficult to assess Arousal/Alertness: Awake/alert Orientation Level: Other (comment) (UTA due to expressive aphasia) Attention: Sustained Sustained Attention: Impaired Sustained Attention Impairment: Verbal basic;Functional basic Safety/Judgment: Impaired Comments: Really difficult to tease out motor planning vs aphasia vs cognitive deficits Sensation Sensation Light Touch: Appears Intact Hot/Cold: Appears Intact Proprioception: Appears Intact Coordination Gross Motor Movements are Fluid and Coordinated: Yes Fine Motor Movements are Fluid and Coordinated: Yes Finger Nose Finger Test: Unable to motor plan 9 Hole Peg Test: 35 seconds R and 32 seconds L Motor  Motor Motor: Within Functional Limits  Trunk/Postural Assessment  Cervical Assessment Cervical Assessment: Within Functional Limits Thoracic Assessment Thoracic Assessment: Within Functional Limits Lumbar Assessment Lumbar Assessment: Within Functional Limits Postural Control Postural Control: Within Functional Limits  Balance Balance Balance Assessed: Yes Static Sitting Balance Static Sitting - Balance Support: No upper extremity supported;Feet supported Static Sitting - Level of Assistance: 7: Independent Dynamic Sitting Balance Dynamic Sitting - Balance Support: Feet supported;No upper extremity supported Dynamic Sitting - Level of Assistance: 7: Independent Static Standing Balance Static Standing - Balance Support: No upper extremity  supported Static Standing - Level of Assistance: 6: Modified independent (Device/Increase time) Dynamic Standing Balance Dynamic Standing - Balance Support: No upper extremity supported Dynamic Standing - Level of Assistance: 5: Stand by assistance Extremity Assessment  RUE Assessment RUE Assessment: Within Functional Limits LUE Assessment LUE Assessment: Within Functional Limits RLE Assessment RLE Assessment: Within Functional Limits LLE Assessment LLE Assessment: Within Functional Limits  Care Tool Care Tool Bed Mobility Roll left and right activity   Roll left and right assist level: Independent    Sit to lying activity   Sit to lying assist level: Independent    Lying to sitting on side of bed activity   Lying to sitting on side of bed assist level: the ability to move from lying on the back to sitting on the side of the bed with no back support.: Independent     Care Tool Transfers Sit to stand transfer   Sit to stand assist level: Supervision/Verbal cueing    Chair/bed transfer   Chair/bed transfer assist level: Supervision/Verbal cueing    Car transfer   Car transfer assist level: Contact Guard/Touching assist      Care Tool Locomotion Ambulation   Assist level: Contact Guard/Touching assist Assistive device: No Device Max distance: 300'  Walk 10 feet activity   Assist level: Contact Guard/Touching assist Assistive device: No Device   Walk 50 feet with 2 turns activity   Assist level: Contact Guard/Touching assist Assistive device: No Device  Walk 150 feet activity   Assist level: Contact Guard/Touching assist Assistive device: No Device  Walk 10 feet on uneven surfaces activity   Assist level: Contact Guard/Touching assist    Stairs   Assist level: Contact Guard/Touching assist Stairs assistive device: No device Max number of stairs: 12  Walk up/down 1 step activity   Walk up/down 1 step (curb) assist level: Contact Guard/Touching assist Walk  up/down 1 step or curb assistive device: No device  Walk up/down 4 steps activity   Walk up/down 4 steps assist level: Contact  Guard/Touching assist Walk up/down 4 steps assistive device: No device  Walk up/down 12 steps activity   Walk up/down 12 steps assist level: Contact Guard/Touching assist Walk up/down 12 steps assistive device: No device  Pick up small objects from floor   Pick up small object from the floor assist level: Contact Guard/Touching assist    Wheelchair Is the patient using a wheelchair?: No          Wheel 50 feet with 2 turns activity      Wheel 150 feet activity        Refer to Care Plan for Long Term Goals  SHORT TERM GOAL WEEK 1 PT Short Term Goal 1 (Week 1): STGS = LTGs  Recommendations for other services: None   Skilled Therapeutic Intervention  Evaluation completed (see details above and below) with education on PT POC and goals and individual treatment initiated with focus on bed mobility, balance, transfers, ambulation, car transfers, and stair training. Pt received supine in bed and agrees to therapy. No complaint of pain. Supine to sit independently. PT provides cues for safety due to pt attempting to get out of bed while attached to purewick. PT removes purewick and provides CGA for pt to ambulate to toilet, with cues for positoining and hand placement on grab bars for safety. Pt is continent of bowel. Following, PT provides verbal and visual cues for handwashing, as pt is severely apraxic and has difficulty with performing tasks. Pt then ambulates to ortho gym with CGA and cues for navigation, as well as increased trunk rotation and arm swing to improve balance, and increasing stride length to decrease risk for falls. Pt completes ramp navigation and car transfer with cues for sequencing and CGA. Following, pt ambulates to main gym and completes x12 6" steps with CGA and no hand rails, with cues for step sequencing and safety. Pt performs standing peg  pattern recreation task to challenge coordination and attention, with pt requiring increased time to complete and verbal cues for accuracy, but overall completes accurately with minimal assistance. Pt ambulates x1500' with CGA and no AD, with same cues, working on endurance. Pt left seated with alarm intact and all needs within reach.   Mobility Bed Mobility Bed Mobility: Supine to Sit;Sit to Supine Supine to Sit: Independent Sit to Supine: Independent Transfers Transfers: Sit to Stand;Stand to Sit;Stand Pivot Transfers Sit to Stand: Supervision/Verbal cueing Stand to Sit: Supervision/Verbal cueing Stand Pivot Transfers: Supervision/Verbal cueing Transfer (Assistive device): None Locomotion  Gait Ambulation: Yes Gait Assistance: Contact Guard/Touching assist Gait Distance (Feet): 300 Feet Assistive device: None Gait Gait: Yes Gait Pattern: Impaired (increased postural sway) Stairs / Additional Locomotion Stairs: Yes Stairs Assistance: Contact Guard/Touching assist Stair Management Technique: No rails Number of Stairs: 12 Height of Stairs: 6 Ramp: Contact Guard/touching assist Curb: Contact Guard/Touching assist Wheelchair Mobility Wheelchair Mobility: No   Discharge Criteria: Patient will be discharged from PT if patient refuses treatment 3 consecutive times without medical reason, if treatment goals not met, if there is a change in medical status, if patient makes no progress towards goals or if patient is discharged from hospital.  The above assessment, treatment plan, treatment alternatives and goals were discussed and mutually agreed upon: by patient  Beau Fanny, PT, DPT 10/02/2023, 4:03 PM

## 2023-10-02 NOTE — Progress Notes (Signed)
 Inpatient Rehabilitation  Patient information reviewed and entered into eRehab system by [facility]. Karen Kays., CCC/SLP, PPS Coordinator.  Information including medical coding, functional ability and quality indicators will be reviewed and updated through discharge.

## 2023-10-02 NOTE — H&P (Addendum)
 Physical Medicine and Rehabilitation Admission H&P    Chief Complaint  Patient presents with   Functional deficits due to SAH/Venous thrombosis    HPI:  Howard Johnson is a 68 year old male with history of T2DM, HTN, CKD, recent issues with cough and facial pain felt to be due to sinusitis and treated with Augmentin and steriods. He was admitted on 09/26/23 with confusion and reports of agitation with abnormal speech. He had word salad, was tachycardic, had leucocytosis-WBC 15.9, lactic acidosis-2.9 , SCr-1.68 and hyperglycemic with BS-454. Head CT showed small foci of hemorrhage in left temporal lobe. 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 CTA negative for LVO.  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 He was started on IV heparin per stroke protocol. He is now on eliquis.   Carotid dopplers were negative for ICA stenosis. 2D echo showed EF 65-70% with no wall abnormality and DD not evaluated due to A fib. CT chest/abdomen/pelvis was negative for malignancy but showed intraluminal density within bladder clinical correlation recommended and BPH. UDS negative. Respiratory panel, HIV  and blood cultures negative.  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  EEG showed moderate to severe diffuse slowing indicated of global cerebral dysfunction and no seizures noted. He was transitioned to Eliquis on 02/15. Serial CT head showed stable hemorrhage with mild increase in surrounding edema. Blood sugars improving with input from diabetes coordinator.  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 Therapy has been working with patient who requires supervision to min assist. He was independent PTA and CIR recommended due to functional decline.   PTA: He and son walk about 3 miles daily weather permitting. 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     Review of Systems  Unable to perform ROS: Language  Eyes:  Positive for double vision.       Family feels that he's reporting double vision. Wears contacts (family has taken these out).   Cardiovascular:  Negative for chest pain.  Musculoskeletal:  Negative for joint pain.       Had problems with right knee last  year--was aspirated twice.   Neurological:  Positive for speech change and headaches.    Past Medical History:  Diagnosis Date   Achilles tendinitis    Left   Diabetes mellitus without complication (HCC)    Elevated PSA    Hyperlipidemia    Hypertension    Right knee pain    Shoulder pain, bilateral     Family History  Problem Relation Age of Onset   Dementia Mother    Lung cancer Father    Diabetes Sister     Social History:  Married. Independent and was working PTA.  Per reports that he has never smoked but exposed to second hand smoke during his childhood. He has never used smokeless tobacco. Per reports he does not drink alcohol and does not use drugs.   Allergies  Allergen Reactions   Tape Rash    Medications Prior to  Admission  Medication Sig Dispense Refill   acetaminophen (TYLENOL) 325 MG tablet Take 2 tablets (650 mg total) by mouth every 4 (four) hours as needed for mild pain (pain score 1-3) (or temp > 37.5 C (99.5 F)).     apixaban (ELIQUIS) 5 MG TABS tablet Take 1 tablet (5 mg total) by mouth 2 (two) times daily.     insulin aspart (NOVOLOG) 100 UNIT/ML injection Inject 0-5 Units into the skin at bedtime.     insulin aspart (NOVOLOG) 100 UNIT/ML  injection Inject 0-9 Units into the skin 3 (three) times daily with meals.     insulin aspart (NOVOLOG) 100 UNIT/ML injection Inject 3 Units into the skin 3 (three) times daily with meals.     insulin glargine-yfgn (SEMGLEE) 100 UNIT/ML injection Inject 0.18 mLs (18 Units total) into the skin daily.     ondansetron (ZOFRAN) 4 MG/2ML SOLN injection Inject 2 mLs (4 mg total) into the vein every 4 (four) hours as needed for nausea or vomiting.     polyethylene glycol (MIRALAX / GLYCOLAX) 17 g packet Take 17 g by mouth daily.     QUEtiapine (SEROQUEL) 25 MG tablet Take 0.5 tablets (12.5 mg total) by mouth 3 (three) times daily as needed (agitation).     rosuvastatin (CRESTOR) 10 MG tablet Take 10 mg by mouth daily.     senna-docusate (SENOKOT-S) 8.6-50 MG tablet Take 1 tablet by mouth 2 (two) times daily.       Home: Home Living Family/patient expects to be discharged to:: Private residence Living Arrangements: Spouse/significant other, Children Available Help at Discharge: Family Type of Home: House Home Access: Stairs to enter Secretary/administrator of Steps: 2 Home Layout: One level Bathroom Shower/Tub: Health visitor: Standard Bathroom Accessibility: Yes Home Equipment: None Additional Comments: sister present but unable to state whether pt has DME at home as she lives out of town.  Lives With: Spouse   Functional History: Prior Function Prior Level of Function : Independent/Modified Independent, Working/employed, Driving   Functional Status:  Mobility: Bed Mobility Overal bed mobility: Modified Independent Bed Mobility: Supine to Sit Supine to sit: Min assist General bed mobility comments: minA to initiate mobility due to impaired command following Transfers Overall transfer level: Needs assistance Equipment used: None Transfers: Sit to/from Stand Sit to Stand: Supervision General transfer comment: followed gestural cue Ambulation/Gait Ambulation/Gait  assistance: Supervision Gait Distance (Feet): 200 Feet Assistive device: None Gait Pattern/deviations: WFL(Within Functional Limits) General Gait Details: steady step-through gait Gait velocity: functional Gait velocity interpretation: >2.62 ft/sec, indicative of community ambulatory Stairs: Yes Stairs assistance: Modified independent (Device/Increase time) Stair Management: Two rails, Alternating pattern, Step to pattern, Forwards Number of Stairs: 5 General stair comments: pt could not understand/return demonstrate using no rails   ADL: ADL Overall ADL's : Needs assistance/impaired Grooming: Minimal assistance, Cueing for sequencing, Standing Grooming Details (indicate cue type and reason): to initiate; undershooting noted Upper Body Bathing: Minimal assistance, Standing, Cueing for sequencing Lower Body Bathing: Minimal assistance, Cueing for sequencing Lower Body Dressing: Cueing for sequencing, Sitting/lateral leans, Minimal assistance Lower Body Dressing Details (indicate cue type and reason): set up after provided mod multimodal cues to overcome difficulty with language. once pt aware of task OT wants pt to perform pt doing so without difficulty Toilet Transfer: Contact guard assist, Ambulation Toilet Transfer Details (indicate cue type and reason): for guidance of direction Toileting- Clothing Manipulation and Hygiene:  (condom cath) Functional mobility during ADLs: Minimal assistance General ADL Comments: pt  needs constant guidance for next task and with very limited self initiation of any mobility   Cognition: Cognition Overall Cognitive Status: Difficult to assess (aphasia) Arousal/Alertness: Awake/alert Orientation Level: Other (comment) (UTA) Attention: Sustained Sustained Attention: Appears intact (sustained attention to self-feeding task) Cognition Arousal: Alert Behavior During Therapy: WFL for tasks assessed/performed Overall Cognitive Status: Difficult to  assess (aphasia)   Blood pressure (!) 155/98, pulse 81, temperature 98 F (36.7 C), resp. rate 16, SpO2 99%.   General: No apparent distress HEENT: Head is normocephalic, atraumatic, sclera anicteric, oral mucosa pink and moist Neck: Supple without JVD or lymphadenopathy Heart: Reg rate and rhythm. No murmurs rubs or gallops Chest: CTA bilaterally without wheezes, rales, or rhonchi; no distress Abdomen: Soft, non-tender, non-distended, bowel sounds positive. Extremities: No clubbing, cyanosis, or edema. Pulses are 2+ Psych: Pt's affect is appropriate. Pt is cooperative Skin: Clean and intact without signs of breakdown Neuro:    Mental Status: Oriented to self. Language very limited to numbers mostly one and two.   Made eye contact.   Speech/Languate: Global aphasia. Perseverating number 2. He was unable to follow simple motor commands without multimodal visual and tactile cues. Question visual deficits --diplopia per family but question mild right inattention?    CRANIAL NERVES: II: PERRL. Visual fields full III, IV, VI: EOMI V: normal sensation bilaterally VII: no asymmetry VIII: normal hearing to speech IX, X: normal palatal elevation XI: 5/5 head turn and 5/5 shoulder shrug bilaterally XII: Tongue midline   MOTOR: At least 4/5 in b/l UE and LE   REFLEXES: no ankle clonus  SENSORY: Normal to pain all 4 extremities     Results for orders placed or performed during the hospital encounter of 10/01/23 (from the past 48 hours)  Glucose, capillary     Status: Abnormal   Collection Time: 10/01/23  9:18 PM  Result Value Ref Range   Glucose-Capillary 238 (H) 70 - 99 mg/dL    Comment: Glucose reference range applies only to samples taken after fasting for at least 8 hours.  Comprehensive metabolic panel     Status: Abnormal   Collection Time: 10/02/23  5:12 AM  Result Value Ref Range   Sodium 135 135 - 145 mmol/L   Potassium 3.3 (L) 3.5 - 5.1 mmol/L   Chloride 105 98  - 111 mmol/L   CO2 22 22 - 32 mmol/L   Glucose, Bld 156 (H) 70 - 99 mg/dL    Comment: Glucose reference range applies only to samples taken after fasting for at least 8 hours.   BUN 27 (H) 8 - 23 mg/dL   Creatinine, Ser 6.04 (H) 0.61 - 1.24 mg/dL   Calcium 8.9 8.9 - 54.0 mg/dL   Total Protein 5.5 (L) 6.5 - 8.1 g/dL   Albumin 2.7 (L) 3.5 - 5.0 g/dL   AST 18 15 - 41 U/L   ALT 24 0 - 44 U/L   Alkaline Phosphatase 66 38 - 126 U/L   Total Bilirubin 0.6 0.0 - 1.2 mg/dL   GFR, Estimated 42 (L) >60 mL/min    Comment: (NOTE) Calculated using the CKD-EPI Creatinine Equation (2021)    Anion gap 8 5 - 15    Comment: Performed at [facility], [address] [place], [place] [address]  CBC with Differential/Platelet     Status: Abnormal   Collection Time: 10/02/23  5:12 AM  Result Value Ref Range   WBC 11.1 (H) 4.0 - 10.5 K/uL   RBC 5.47 4.22 -  5.81 MIL/uL   Hemoglobin 15.0 13.0 - 17.0 g/dL   HCT 78.2 95.6 - 21.3 %   MCV 82.6 80.0 - 100.0 fL   MCH 27.4 26.0 - 34.0 pg   MCHC 33.2 30.0 - 36.0 g/dL   RDW 08.6 57.8 - 46.9 %   Platelets 277 150 - 400 K/uL   nRBC 0.0 0.0 - 0.2 %   Neutrophils Relative % 81 %   Neutro Abs 9.2 (H) 1.7 - 7.7 K/uL   Lymphocytes Relative 7 %   Lymphs Abs 0.7 0.7 - 4.0 K/uL   Monocytes Relative 7 %   Monocytes Absolute 0.8 0.1 - 1.0 K/uL   Eosinophils Relative 3 %   Eosinophils Absolute 0.3 0.0 - 0.5 K/uL   Basophils Relative 1 %   Basophils Absolute 0.1 0.0 - 0.1 K/uL   Immature Granulocytes 1 %   Abs Immature Granulocytes 0.05 0.00 - 0.07 K/uL    Comment: Performed at [facility], [address] [place], [place] [address]  Glucose, capillary     Status: Abnormal   Collection Time: 10/02/23  6:02 AM  Result Value Ref Range   Glucose-Capillary 153 (H) 70 - 99 mg/dL    Comment: Glucose reference range applies only to samples taken after fasting for at least 8 hours.   No results found.     Blood pressure (!) 155/98, pulse 81, temperature  98 F (36.7 C), resp. rate 16, SpO2 99%.  Medical Problem List and Plan: 1. Functional deficits secondary to Left temporal lobe IPH with CVST  -patient may shower  -ELOS/Goals: 5-7 days Mod I, Pt, Sup to min A OT, min A to mod A with SLP  -Continue CIR, evaluations today 2.  Antithrombotics: -DVT/anticoagulation:  Pharmaceutical: Eliquis  -antiplatelet therapy: N/A 3. Pain Management: Tylenol prn for pain 4. Mood/Behavior/Sleep: LCSW to follow for evaluation and support.   -antipsychotic agents: Seroquel prn for agitation.  5. Neuropsych/cognition: This patient is not capable of making decisions on his own behalf. 6. Skin/Wound Care: Routine pressure relief measures.  7. Fluids/Electrolytes/Nutrition: Monitor I/O. Check CMET in am 8. HTN: Monitor BP TID. BP improving. Continue to hold Norvasc.   -BP fair control, continue to monitor    10/02/2023    4:07 AM 10/01/2023    7:47 PM 10/01/2023    7:45 PM  Vitals with BMI  Systolic 155 132   Diastolic 98 77   Pulse 81 88 86    9. T2DM: Hgb A1c-12.4. Add CM restrictions to diet. Was on Farxiga and Rybelsus PTA.  --Monitor BS ac/hs and use SSI for elevated BS.  --Continue Insulin glargine with 3 units TID AC for meal coverage. Was on prednisone for sinus infection. -2/19 increase glargine from 18u to 20u  CBG (last 3)  Recent Labs    10/01/23 1659 10/01/23 2118 10/02/23 0602  GLUCAP 155* 238* 153*    10. Leucocytosis Monitor for fevers and other signs of infection. WBC down to 11 range.  --sinus infection was treated with Augmentin thru 09/29/23.  11. CKD: BUN elevated--encourage fluid intake. SCr trending down from 1.68-->1.26.   -2/19 Cr up to 1.72, encourage oral fluids 12. Constipation: Finally had BM last night and another after lunch. --Continue Senna 2 daily in am.  13. Headaches/Facial pain?:  Rhinocort and Claritin added for facial pain/congestion. Will schedule tylenol bid.  14. Hypokalemia   -2/19 today  potassium  Addendum: nystatin cream for peline rash  Jacquelynn Cree, PA-C  10/01/2023  I have personally performed a face to face diagnostic evaluation of this patient and formulated the key components of the plan.  Additionally, I have personally reviewed laboratory data, imaging studies, as well as relevant notes and concur with the physician assistant's documentation above.  The patient's status has not changed from the original H&P.  Any changes in documentation from the acute care chart have been noted above.  Fanny Dance, MD

## 2023-10-02 NOTE — Progress Notes (Signed)
 Met with patient to review current situation, team conferences and plan of care. Patient limited by expressive/receptive aphasia.  Will follow-up  when family in the room.Continue to follow along to provide educational needs to facilitate preparation for discharge.

## 2023-10-02 NOTE — Discharge Instructions (Addendum)
 Inpatient Rehab Discharge Instructions  Howard Johnson Discharge date and time:  10/08/23  Activities/Precautions/ Functional Status: Activity: no lifting, driving, or strenuous exercise till cleared by MD  Diet: cardiac diet and diabetic diet Wound Care: none needed   Functional status:  ___ No restrictions       ___ Walk up steps independently _X__ 24/7 supervision/assistance   ___ Walk up steps with assistance ___ Intermittent supervision/assistance  ___ Bathe/dress independently ___ Walk with walker     ___ Bathe/dress with assistance ___ Walk Independently     ___ Shower independently ___ Walk with assistance     _X__ Shower with supervision  _X__ No alcohol      ___ Return to work/school ________   Special Instructions: Has dexcom in place--if there is a concern about blood sugar reading (too high or too low) recheck with glucometer/finger stick.   COMMUNITY REFERRALS UPON DISCHARGE:    Outpatient: PT      OT     ST              Agency:[facility]  Phone:[phone]              Appointment Date/Time: *Please expect follow-up within 7-10 business days to schedule your appointment. If you have not received follow-up, be sure to contact the site directly.*    STROKE/TIA DISCHARGE INSTRUCTIONS SMOKING Cigarette smoking nearly doubles your risk of having a stroke & is the single most alterable risk factor  If you smoke or have smoked in the last 12 months, you are advised to quit smoking for your health. Most of the excess cardiovascular risk related to smoking disappears within a year of stopping. Ask you doctor about anti-smoking medications [place] Quit Line: 1-800-QUIT NOW Free Smoking Cessation Classes [phone]  CHOLESTEROL Know your levels; limit fat & cholesterol in your diet  Lipid Panel     Component Value Date/Time   CHOL 112 09/27/2023 0530   TRIG 110 09/27/2023 0530   HDL 36 (L) 09/27/2023 0530   CHOLHDL 3.1 09/27/2023 0530   VLDL 22 09/27/2023  0530   LDLCALC 54 09/27/2023 0530     Many patients benefit from treatment even if their cholesterol is at goal. Goal: Total Cholesterol (CHOL) less than 160 Goal:  Triglycerides (TRIG) less than 150 Goal:  HDL greater than 40 Goal:  LDL (LDLCALC) less than 100   BLOOD PRESSURE American Stroke Association blood pressure target is less that 120/80 mm/Hg  Your discharge blood pressure is:  BP: 127/80 Monitor your blood pressure Limit your salt and alcohol intake Many individuals will require more than one medication for high blood pressure  DIABETES (A1c is a blood sugar average for last 3 months) Goal HGBA1c is under 7% (HBGA1c is blood sugar average for last 3 months)  Diabetes:     Lab Results  Component Value Date   HGBA1C 12.4 (H) 09/30/2023    Your HGBA1c can be lowered with medications, healthy diet, and exercise. Check your blood sugar as directed by your physician Call your physician if you experience unexplained or low blood sugars.  PHYSICAL ACTIVITY/REHABILITATION Goal is 30 minutes at least 4 days per week  Activity: No driving, Therapies: see above Return to work: N/A Activity decreases your risk of heart attack and stroke and makes your heart stronger.  It helps control your weight and blood pressure; helps you relax and can improve your mood. Participate in a regular exercise program. Talk with your doctor about the  best form of exercise for you (dancing, walking, swimming, cycling).  DIET/WEIGHT Goal is to maintain a healthy weight  Your discharge diet is:  Diet Order             Diet Carb Modified Fluid consistency: Thin; Room service appropriate? Yes with Assist  Diet effective now                   liquids Your height is:  6'4" Your current weight is: 202 Your Body Mass Index (BMI) is:  24 Following the type of diet specifically designed for you will help prevent another stroke. You are at goal weight  Your goal Body Mass Index (BMI) is  19-24. Healthy food habits can help reduce 3 risk factors for stroke:  High cholesterol, hypertension, and excess weight.  RESOURCES Stroke/Support Group:  Call [phone]   STROKE EDUCATION PROVIDED/REVIEWED AND GIVEN TO PATIENT Stroke warning signs and symptoms How to activate emergency medical system (call 911). Medications prescribed at discharge. Need for follow-up after discharge. Personal risk factors for stroke. Pneumonia vaccine given:  Flu vaccine given:  My questions have been answered, the writing is legible, and I understand these instructions.  I will adhere to these goals & educational materials that have been provided to me after my discharge from the hospital.     My questions have been answered and I understand these instructions. I will adhere to these goals and the provided educational materials after my discharge from the hospital.  Patient/Caregiver Signature _______________________________ Date __________  Clinician Signature _______________________________________ Date __________  Please bring this form and your medication list with you to all your follow-up doctor's appointments.     Information on my medicine - ELIQUIS (apixaban)  This medication education was reviewed with me or my healthcare representative as part of my discharge preparation.    Why was Eliquis prescribed for you? Eliquis was prescribed for you to reduce the risk of a blood clot forming that can cause a stroke if you have a medical condition called atrial fibrillation (a type of irregular heartbeat).  What do You need to know about Eliquis ? Take your Eliquis TWICE DAILY - one tablet in the morning and one tablet in the evening with or without food. If you have difficulty swallowing the tablet whole please discuss with your pharmacist how to take the medication safely.  Take Eliquis exactly as prescribed by your doctor and DO NOT stop taking Eliquis without talking to the doctor  who prescribed the medication.  Stopping may increase your risk of developing a stroke.  Refill your prescription before you run out.  After discharge, you should have regular check-up appointments with your healthcare provider that is prescribing your Eliquis.  In the future your dose may need to be changed if your kidney function or weight changes by a significant amount or as you get older.  What do you do if you miss a dose? If you miss a dose, take it as soon as you remember on the same day and resume taking twice daily.  Do not take more than one dose of ELIQUIS at the same time to make up a missed dose.  Important Safety Information A possible side effect of Eliquis is bleeding. You should call your healthcare provider right away if you experience any of the following: Bleeding from an injury or your nose that does not stop. Unusual colored urine (red or dark brown) or unusual colored stools (red or black). Unusual bruising  for unknown reasons. A serious fall or if you hit your head (even if there is no bleeding).  Some medicines may interact with Eliquis and might increase your risk of bleeding or clotting while on Eliquis. To help avoid this, consult your healthcare provider or pharmacist prior to using any new prescription or non-prescription medications, including herbals, vitamins, non-steroidal anti-inflammatory drugs (NSAIDs) and supplements.  This website has more information on Eliquis (apixaban): http://www.eliquis.com/eliquis/home

## 2023-10-02 NOTE — Progress Notes (Signed)
 Patient ID: Howard Johnson, male   DOB: 06-04-1956, 68 y.o.   MRN: [id number]  SW made efforts to complete assessment with pt however, unable to due to pt being aphasic. SW will complete with his wife.   Cecile Sheerer, MSW, LCSW Office: [phone] Cell: [phone] Fax: [phone]

## 2023-10-02 NOTE — Evaluation (Signed)
 Occupational Therapy Assessment and Plan  Patient Details  Name: Howard Johnson MRN: [id number] Date of Birth: 11-11-1955  OT Diagnosis: apraxia and cognitive deficits Rehab Potential: Rehab Potential (ACUTE ONLY): Excellent ELOS: 3-5 days   Today's Date: 10/02/2023 OT Individual Time: 4132-4401 OT Individual Time Calculation (min): 90 min     Hospital Problem: Principal Problem:   Cerebral venous sinus thrombosis   Past Medical History:  Past Medical History:  Diagnosis Date   Achilles tendinitis    Left   Diabetes mellitus without complication (HCC)    Elevated PSA    Hyperlipidemia    Hypertension    Right knee pain    Shoulder pain, bilateral    Past Surgical History:  Past Surgical History:  Procedure Laterality Date   TONSILLECTOMY      Assessment & Plan Clinical Impression:Howard Johnson is a 68 y.o. male with hx of DM, HTN who presented on 09/26/23 with confusion,agitation, abnormal speech, ?URI. W/u positive for leukocytosis. CXR + for bilateral interstitial thickening which was felt to be chronic. CT of head revealed a small focus of acute hemorrhage within the left temporal lobe. MRI was then performed which demonstrated the [facility] and findings worrisome for dural venous sinus thrombosis. 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 Pt was placed on eliquis by neurology.  Pt was up with PT yesterday and was supervision for sit-std transfers and 200' ambulation without an AD. He required min assist for basic ADL's. 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 He's on regular diet currently. Pt lives with spouse in a one level house with 2 steps to enter. He was independent and driving PTA.  Patient transferred to CIR on 10/01/2023 .    Patient currently requires supervision with basic self-care skills  secondary to muscle weakness, motor apraxia and decreased motor planning, and decreased standing balance and decreased balance strategies.  Prior to hospitalization, patient could complete ADLs with independent .  Patient will benefit from skilled intervention to increase independence with basic self-care skills prior to discharge home with care partner.  Anticipate patient will require 24 hour supervision and follow up outpatient.  OT - End of Session Activity Tolerance: Endurance does not limit participation in activity Endurance Deficit: No OT Assessment Rehab Potential (ACUTE ONLY): Excellent OT Patient demonstrates impairments in the following area(s): Balance;Perception;Endurance;Motor;Vision OT Basic ADL's Functional Problem(s): Toileting;Dressing;Bathing OT Transfers Functional Problem(s): Toilet;Tub/Shower OT Additional Impairment(s): None OT Plan OT Intensity: Minimum of 1-2 x/day, 45 to 90 minutes OT Frequency: 5 out of 7 days OT Duration/Estimated Length of Stay: 3-5 days OT Treatment/Interventions: Balance/vestibular training;Discharge planning;Self Care/advanced ADL retraining;Therapeutic Activities;Therapeutic Exercise;Skin care/wound managment;Patient/family education;Functional mobility training;Visual/perceptual remediation/compensation;Community reintegration;DME/adaptive equipment instruction;Psychosocial support;UE/LE Strength taining/ROM OT Self Feeding Anticipated Outcome(s): no goal OT Basic Self-Care Anticipated Outcome(s): mod I OT Toileting Anticipated Outcome(s): mod I OT Bathroom Transfers Anticipated Outcome(s): mod I OT Recommendation Patient destination: Home Follow Up Recommendations: Outpatient OT Equipment Recommended: None recommended by OT   OT Evaluation Precautions/Restrictions  Precautions Precautions: Fall Precaution/Restrictions Comments: expressive and receptive aphasia Restrictions Weight Bearing Restrictions Per Provider Order:  No General Chart Reviewed: Yes Family/Caregiver Present: No  Home Living/Prior Functioning Home Living Family/patient expects to be discharged to:: Private residence Living Arrangements: Spouse/significant other Available Help at Discharge: Family Type of Home: House Home Access: Stairs to enter [employer] of Steps: 2 Home Layout: One  level Bathroom Shower/Tub: Engineer, manufacturing systems: Yes Additional Comments: confirmed with son via phone  Lives With: Spouse IADL History Homemaking Responsibilities: Yes Meal Prep Responsibility: Secondary Laundry Responsibility: Secondary Cleaning Responsibility: Secondary Bill Paying/Finance Responsibility: Secondary Shopping Responsibility: Secondary Current License: Yes Mode of Transportation: Car Occupation: Full time employment 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, 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 Prior Function Level of Independence: Independent with basic ADLs, Independent with transfers, Independent with gait Driving: Yes Vocation: Full time employment Vision Baseline Vision/History: 1 Wears glasses Ability to See in Adequate Light: 1 Impaired Patient Visual Report: Diplopia Vision Assessment?: Yes Eye Alignment: Within Functional Limits Ocular Range of Motion: Within Functional Limits Alignment/Gaze Preference: Within Defined Limits Tracking/Visual Pursuits: Able to track stimulus in all quads without difficulty Saccades: Within functional limits Convergence: Impaired - to be further tested in functional context Diplopia Assessment: Other (comment) Additional Comments: Able to tell OT diplopia is occuring at times. He was mostly accurate during a BITS functional reaching assessment and no indication of field cut. Will continue to assess functionally Perception  Perception: Within Functional Limits Praxis Praxis: Impaired Praxis Impairment Details: Motor  planning Cognition Cognition Overall Cognitive Status: Difficult to assess Arousal/Alertness: Awake/alert Attention: Sustained Sustained Attention: Impaired Sustained Attention Impairment: Verbal basic;Functional basic Safety/Judgment: Impaired Comments: Really difficult to tease out motor planning vs aphasia vs cognitive deficits Brief Interview for Mental Status (BIMS) Repetition of Three Words (First Attempt): Nonsensical Temporal Orientation: Year: Nonsensical Temporal Orientation: Month: Nonsensical Temporal Orientation: Day: Nonsensical Recall: "Sock": Nonsensical Recall: "Blue": Nonsensical Recall: "Bed": Nonsensical BIMS Summary Score: 99 Sensation Sensation Light Touch: Appears Intact Hot/Cold: Appears Intact Proprioception: Appears Intact Coordination Gross Motor Movements are Fluid and Coordinated: Yes Fine Motor Movements are Fluid and Coordinated: Yes Finger Nose Finger Test: Unable to motor plan 9 Hole Peg Test: 35 seconds R and 32 seconds L Motor  Motor Motor: Within Functional Limits  Trunk/Postural Assessment  Cervical Assessment Cervical Assessment: Within Functional Limits Thoracic Assessment Thoracic Assessment: Within Functional Limits Lumbar Assessment Lumbar Assessment: Within Functional Limits Postural Control Postural Control: Within Functional Limits  Balance Balance Balance Assessed: Yes Static Sitting Balance Static Sitting - Balance Support: No upper extremity supported;Feet supported Static Sitting - Level of Assistance: 7: Independent Dynamic Sitting Balance Dynamic Sitting - Balance Support: Feet supported;No upper extremity supported Dynamic Sitting - Level of Assistance: 7: Independent Static Standing Balance Static Standing - Balance Support: No upper extremity supported Static Standing - Level of Assistance: 6: Modified independent (Device/Increase time) Dynamic Standing Balance Dynamic Standing - Balance Support: No upper  extremity supported Dynamic Standing - Level of Assistance: 5: Stand by assistance Extremity/Trunk Assessment RUE Assessment RUE Assessment: Within Functional Limits LUE Assessment LUE Assessment: Within Functional Limits  Care Tool Care Tool Self Care Eating   Eating Assist Level: Independent    Oral Care    Oral Care Assist Level: Supervision/Verbal cueing    Bathing   Body parts bathed by patient: Right arm;Left arm;Chest;Abdomen;Front perineal area;Buttocks;Right upper leg;Left upper leg;Left lower leg;Face;Right lower leg     Assist Level: Supervision/Verbal cueing    Upper Body Dressing(including orthotics)   What is the patient wearing?: Pull over shirt   Assist Level: Set up assist    Lower Body Dressing (excluding footwear)   What is the patient wearing?: Underwear/pull up;Pants Assist for lower body dressing: Supervision/Verbal cueing    Putting on/Taking off footwear   What is the patient wearing?: Socks;Shoes Assist for footwear: Supervision/Verbal cueing  Care Tool Toileting Toileting activity   Assist for toileting: Supervision/Verbal cueing     Care Tool Bed Mobility Roll left and right activity   Roll left and right assist level: Independent    Sit to lying activity   Sit to lying assist level: Independent    Lying to sitting on side of bed activity   Lying to sitting on side of bed assist level: the ability to move from lying on the back to sitting on the side of the bed with no back support.: Independent     Care Tool Transfers Sit to stand transfer   Sit to stand assist level: Supervision/Verbal cueing    Chair/bed transfer   Chair/bed transfer assist level: Supervision/Verbal cueing     Toilet transfer   Assist Level: Supervision/Verbal cueing     Care Tool Cognition  Expression of Ideas and Wants Expression of Ideas and Wants: 2. Frequent difficulty - frequently exhibits difficulty with expressing needs and ideas   Understanding Verbal and Non-Verbal Content Understanding Verbal and Non-Verbal Content: 2. Sometimes understands - understands only basic conversations or simple, direct phrases. Frequently requires cues to understand   Memory/Recall Ability Memory/Recall Ability : None of the above were recalled   Refer to Care Plan for Long Term Goals  SHORT TERM GOAL WEEK 1 OT Short Term Goal 1 (Week 1): STG=LTG d/t ELOS  Recommendations for other services: None    Skilled Therapeutic Intervention ADL ADL Eating: Independent Where Assessed-Eating: Chair Grooming: Supervision/safety Where Assessed-Grooming: Standing at sink Upper Body Bathing: Supervision/safety Where Assessed-Upper Body Bathing: Shower Lower Body Bathing: Supervision/safety Where Assessed-Lower Body Bathing: Shower Upper Body Dressing: Supervision/safety Where Assessed-Upper Body Dressing: Edge of bed Lower Body Dressing: Supervision/safety Where Assessed-Lower Body Dressing: Edge of bed Toileting: Supervision/safety Where Assessed-Toileting: Teacher, adult education: Distant supervision Statistician Method: Event organiser: Close supervision Film/video editor Method: Ambulating Mobility  Bed Mobility Bed Mobility: Supine to Sit;Sit to Supine Supine to Sit: Independent Sit to Supine: Independent Transfers Sit to Stand: Supervision/Verbal cueing Stand to Sit: Supervision/Verbal cueing   Skilled OT evaluation completed with the creation of pt centered OT POC. Pt educated on condition, ELOS, rehab expectations, and fall risk reduction strategies throughout session. Pt is limited by motor planning deficits and expressive/receptive aphasia that limits his ability to complete higher level / novel tasks with prior level of independence. Spoke with his son on the phone to determine PLOF- he was still working full time and quite active. He completed a shower at (S) level, demonstrating good  automatic/functional motor planning. Had to utilize max multimodal cueing/problem solving to get pt to motor plan through several non-functional assessments, including 9 hole peg test, dynamometer testing, and BITS. Was able to determine pt has diplopia, uncertain how frequently this occurs as he had no issues reaching out for objects during ADLs and accuracy was normal. Unable to finger-nose test. He completed 200 ft of functional mobility at (S) level during the eval. Pt was left sitting up in the recliner with all needs met, chair alarm set, and call bell within reach.    Discharge Criteria: Patient will be discharged from OT if patient refuses treatment 3 consecutive times without medical reason, if treatment goals not met, if there is a change in medical status, if patient makes no progress towards goals or if patient is discharged from hospital.  The above assessment, treatment plan, treatment alternatives and goals were discussed and mutually agreed upon: by patient  Greggory Keen  Natacha Jepsen 10/02/2023, 11:24 AM

## 2023-10-03 DIAGNOSIS — D72829 Elevated white blood cell count, unspecified: Secondary | ICD-10-CM

## 2023-10-03 DIAGNOSIS — E1165 Type 2 diabetes mellitus with hyperglycemia: Secondary | ICD-10-CM

## 2023-10-03 DIAGNOSIS — G08 Intracranial and intraspinal phlebitis and thrombophlebitis: Secondary | ICD-10-CM | POA: Diagnosis not present

## 2023-10-03 DIAGNOSIS — E876 Hypokalemia: Secondary | ICD-10-CM | POA: Diagnosis not present

## 2023-10-03 DIAGNOSIS — R21 Rash and other nonspecific skin eruption: Secondary | ICD-10-CM | POA: Diagnosis not present

## 2023-10-03 LAB — GLUCOSE, CAPILLARY
Glucose-Capillary: 123 mg/dL — ABNORMAL HIGH (ref 70–99)
Glucose-Capillary: 202 mg/dL — ABNORMAL HIGH (ref 70–99)
Glucose-Capillary: 94 mg/dL (ref 70–99)
Glucose-Capillary: 113 mg/dL — ABNORMAL HIGH (ref 70–99)

## 2023-10-03 LAB — BASIC METABOLIC PANEL
Anion gap: 10 (ref 5–15)
BUN: 26 mg/dL — ABNORMAL HIGH (ref 8–23)
CO2: 24 mmol/L (ref 22–32)
Calcium: 9.3 mg/dL (ref 8.9–10.3)
Chloride: 105 mmol/L (ref 98–111)
Creatinine, Ser: 1.54 mg/dL — ABNORMAL HIGH (ref 0.61–1.24)
GFR, Estimated: 49 mL/min — ABNORMAL LOW (ref >60)
Glucose, Bld: 113 mg/dL — ABNORMAL HIGH (ref 70–99)
Potassium: 3.4 mmol/L — ABNORMAL LOW (ref 3.5–5.1)
Sodium: 139 mmol/L (ref 135–145)

## 2023-10-03 LAB — CBC
HCT: 44.7 % (ref 39.0–52.0)
Hemoglobin: 14.9 g/dL (ref 13.0–17.0)
MCH: 27.4 pg (ref 26.0–34.0)
MCHC: 33.3 g/dL (ref 30.0–36.0)
MCV: 82.3 fL (ref 80.0–100.0)
Platelets: 306 10*3/uL (ref 150–400)
RBC: 5.43 MIL/uL (ref 4.22–5.81)
RDW: 14.6 % (ref 11.5–15.5)
WBC: 11.5 10*3/uL — ABNORMAL HIGH (ref 4.0–10.5)
nRBC: 0 % (ref 0.0–0.2)

## 2023-10-03 MED ORDER — POTASSIUM CHLORIDE CRYS ER 20 MEQ PO TBCR
20.0000 meq | EXTENDED_RELEASE_TABLET | Freq: Two times a day (BID) | ORAL | Status: AC
  Administered 2023-10-03 (×2): 20 meq via ORAL
  Filled 2023-10-03 (×2): qty 1

## 2023-10-03 NOTE — Progress Notes (Signed)
 PROGRESS NOTE   Subjective/Complaints: No acute events overnight noted.  Communication limited by aphasia, patient indicates that volumes on his TV may have been too high yesterday.  We adjusted it and reviewed how to turn this up and down.  ROS: Patient denies fever, new vision changes, dizziness, nausea, vomiting, diarrhea,  shortness of breath or chest pain, headache, or mood change.  Objective:   No results found. Recent Labs    10/02/23 0512 10/03/23 0535  WBC 11.1* 11.5*  HGB 15.0 14.9  HCT 45.2 44.7  PLT 277 306   Recent Labs    10/02/23 0512 10/03/23 0535  NA 135 139  K 3.3* 3.4*  CL 105 105  CO2 22 24  GLUCOSE 156* 113*  BUN 27* 26*  CREATININE 1.72* 1.54*  CALCIUM 8.9 9.3    Intake/Output Summary (Last 24 hours) at 10/03/2023 1137 Last data filed at 10/03/2023 0454 Gross per 24 hour  Intake 240 ml  Output --  Net 240 ml        Physical Exam: Vital Signs Blood pressure 117/73, pulse 75, temperature (!) 97.3 F (36.3 C), temperature source Oral, resp. rate 16, SpO2 97%.    Assessment/Plan: 1. Functional deficits which require 3+ hours per day of interdisciplinary therapy in a comprehensive inpatient rehab setting. Physiatrist is providing close team supervision and 24 hour management of active medical problems listed below. Physiatrist and rehab team continue to assess barriers to discharge/monitor patient progress toward functional and medical goals  Care Tool:  Bathing    Body parts bathed by patient: Right arm, Left arm, Chest, Abdomen, Front perineal area, Buttocks, Right upper leg, Left upper leg, Left lower leg, Face, Right lower leg         Bathing assist Assist Level: Supervision/Verbal cueing     Upper Body Dressing/Undressing Upper body dressing   What is the patient wearing?: Pull over shirt    Upper body assist Assist Level: Set up assist    Lower Body  Dressing/Undressing Lower body dressing      What is the patient wearing?: Underwear/pull up, Pants     Lower body assist Assist for lower body dressing: Supervision/Verbal cueing     Toileting Toileting    Toileting assist Assist for toileting: Supervision/Verbal cueing     Transfers Chair/bed transfer  Transfers assist     Chair/bed transfer assist level: Supervision/Verbal cueing     Locomotion Ambulation   Ambulation assist      Assist level: Contact Guard/Touching assist Assistive device: No Device Max distance: 300'   Walk 10 feet activity   Assist     Assist level: Contact Guard/Touching assist Assistive device: No Device   Walk 50 feet activity   Assist    Assist level: Contact Guard/Touching assist Assistive device: No Device    Walk 150 feet activity   Assist    Assist level: Contact Guard/Touching assist Assistive device: No Device    Walk 10 feet on uneven surface  activity   Assist     Assist level: Contact Guard/Touching assist     Wheelchair     Assist Is the patient using a wheelchair?: No  Wheelchair 50 feet with 2 turns activity    Assist            Wheelchair 150 feet activity     Assist          Blood pressure 117/73, pulse 75, temperature (!) 97.3 F (36.3 C), temperature source Oral, resp. rate 16, SpO2 97%.   Medical Problem List and Plan: 1. 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  Antithrombotics: -DVT/anticoagulation:  Pharmaceutical: Eliquis             -antiplatelet therapy: N/A 3. Pain Management: Tylenol prn for pain 4. Mood/Behavior/Sleep: LCSW to follow for evaluation and support.              -antipsychotic agents: Seroquel prn for agitation.  5.  Neuropsych/cognition: This patient is not capable of making decisions on his own behalf. 6. Skin/Wound Care: Routine pressure relief measures.  7. Fluids/Electrolytes/Nutrition: Monitor I/O. Check CMET in am 8. HTN: Monitor BP TID. BP improving. Continue to hold Norvasc.              -2/20 BP controlled, continue current regimen    10/03/2023    4:36 AM 10/02/2023    7:58 PM 10/02/2023    7:56 PM  Vitals with BMI  Systolic 117 124 161  Diastolic 73 80 79  Pulse 75 98 97    9. T2DM: Hgb A1c-12.4. Add CM restrictions to diet. Was on Farxiga and Rybelsus PTA.  --Monitor BS ac/hs and use SSI for elevated BS.             --Continue Insulin glargine with 3 units TID AC for meal coverage. Was on prednisone for sinus infection. -2/19 increase glargine from 18u to 20u   2/20 monitor response to medication change CBG (last 3)  Recent Labs    10/02/23 2029 10/03/23 0534 10/03/23 1120  GLUCAP 248* 123* 202*    10. Leucocytosis Monitor for fevers and other signs of infection. WBC down to 11 range.  --sinus infection was treated with Augmentin thru 09/29/23.  -2/20 WBC 11.5, elevated but overall stable from last few days.  Monitor for signs of infection 11. CKD: BUN elevated--encourage fluid intake. SCr trending down from 1.68-->1.26.              -2/20 creatinine down from 1.72 to 1.54, continue encourage oral intake 12. Constipation: Finally had BM last night and another after lunch. --Continue Senna 2 daily in am.   -2/20 LBM today, continue to monitor 13. Headaches/Facial pain?:  Rhinocort and Claritin added for facial pain/congestion. Will schedule tylenol bid.  14. Hypokalemia              -2/19 today potassium  2/20  K+ 3.4, potassium x2 today 15.  Penile rash  -Nystatin cream  LOS: 2 days A FACE TO FACE EVALUATION WAS PERFORMED  Fanny Dance 10/03/2023, 11:37 AM

## 2023-10-03 NOTE — Progress Notes (Signed)
 Occupational Therapy Session Note  Patient Details  Name: Howard Johnson MRN: [id number] Date of Birth: [birth year]  Today's Date: 10/03/2023 OT Individual Time: 1300-1419 OT Individual Time Calculation (min): 79 min    Short Term Goals: Week 1:  OT Short Term Goal 1 (Week 1): STG=LTG d/t ELOS  Skilled Therapeutic Interventions/Progress Updates:   Pt greeted seated in recliner, pt agreeable to OT intervention.      Transfers/bed mobility/functional mobility: pt completed all functional ambulation with no AD and supervision- CGA. MAX multimodal directional cues needed in hallway.   Therapeutic activity: pt was able to state "yes" when asked if he played basketball. Pt instructed to sit EOM to toss basketball into hoop with an emphasis on global endurance and strength with pt completing task with supervision.    ADLs:  Grooming: pt stood at sink for oral care and shaving task with eletric razor. Pt needed assist to set-up all ADLs but once ADL was demonstrated pt able to complete task with supervision for safety.   IADLS:pt completed various IADLs in kitchen in order to facilitate improved ability to follow directions during higher level ADLs/IADLs:  -pt able to sort fruits and veggies by color with a focus on improving word finding skills, pt completed task with 70% accuracy needing MOD verbal cues and 2 choices provided. - pt able to organize silverware with supervision and increased time after demonstration provided -pt able to load dishwasher from standing to challenge dynamic balance and increase independence with functional tasks. Pt first required demo of where items needed to be placed then pt was able to carryover task with only supervision for balance.  - pt also able to stand to put away dishes in cabinets with an emphasis on dynamic reaching and problem solving for ADL participation. Pt completed task with supervision and MOD verbal cues for sequencing and placement.   -pt able to  make the bed in apt with a focus on dynamic balance and functional endurance for IADLs. Pt completed task with supervision, pt states " I do this at home."  - pt able to stand to fold wash cloths with demo provided 1x then pt able to carryover task with supervision.   Ended session with pt seated in recliner with all needs within reach and safety belt alarm activated.                   Therapy Documentation Precautions:  Precautions Precautions: Fall Precaution/Restrictions Comments: expressive and receptive aphasia Restrictions Weight Bearing Restrictions Per Provider Order: No  Pain: No indications of pain noted during session    Therapy/Group: Individual Therapy  Barron Schmid 10/03/2023, 3:24 PM

## 2023-10-03 NOTE — Progress Notes (Signed)
 Inpatient Rehabilitation Care Coordinator Assessment and Plan Patient Details  Name: Howard Johnson MRN: [id number] Date of Birth: [birth year]  Today's Date: 10/03/2023  Hospital Problems: Principal Problem:   Cerebral venous sinus thrombosis  Past Medical History:  Past Medical History:  Diagnosis Date   Achilles tendinitis    Left   Diabetes mellitus without complication (HCC)    Elevated PSA    Hyperlipidemia    Hypertension    Right knee pain    Shoulder pain, bilateral    Past Surgical History:  Past Surgical History:  Procedure Laterality Date   TONSILLECTOMY     Social History:  reports that he has never smoked. He has never used smokeless tobacco. He reports that he does not drink alcohol and does not use drugs.  Family / Support Systems Marital Status: Married How Long?: 43 years Patient Roles: Spouse, Parent Spouse/Significant Other: [name] (wife) Children: son [name] Other Supports: none reported Anticipated Caregiver: Wife Ability/Limitations of Caregiver: Pt wife will provide 24/7 care. Pt wife reports their son will moveinto the home and help PRN. Caregiver Availability: 24/7 Family Dynamics: Pt lives with his wife.  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 Issues: wife denies Guardian/Conservator: Product manager- 1) wifeMargaret, and 2) son Carsin Randazzo   Abuse/Neglect Abuse/Neglect Assessment Can Be Completed: Unable to assess, patient is non-responsive or altered mental status (aphasic)  Patient  response to: Social Isolation - How often do you feel lonely or isolated from those around you?:  (aphasic)  Emotional Status Pt's affect, behavior and adjustment status: Pt in goood spirits but unable to complete assessment due to aphasia Recent Psychosocial Issues: wife denies Psychiatric History: wife denies Substance Abuse History: wife denies  Patient / Family Perceptions, Expectations & Goals Pt/Family understanding of illness & functional limitations: Pt wife has a general understanding of care needs Premorbid pt/family roles/activities: independent Anticipated changes in roles/activities/participation: Assistance with ADLs/IADLs Pt/family expectations/goals: Patient's wife would like for him to continue to work on speech.  Community Resources [employer]: None Premorbid Home Care/DME Agencies: None Transportation available at discharge: TBD Is the patient able to respond to transportation needs?: Yes In the past 12 months, has lack of transportation kept you from medical appointments or from getting medications?: No In the past 12 months, has lack of transportation kept you from meetings, work, or from getting things needed for daily living?: No Resource referrals recommended: Neuropsychology  Discharge Planning Living Arrangements: Spouse/significant other, Children Support Systems: Spouse/significant other, Children Type of Residence: Private residence Insurance Resources: Media planner (specify) (healthteam Advantage) Financial Resources: Employment Financial Screen Referred: No Living Expenses: Own Money Management: Patient, Spouse Does the patient have any problems obtaining your medications?: No Home Management: Both help manage home care need Patient/Family Preliminary Plans: Wife Care Coordinator Barriers to Discharge: Decreased caregiver support, Lack of/limited family support, Insurance for SNF coverage Care Coordinator Anticipated Follow Up Needs:  HH/OP Expected length of stay: D/c 2/25  Clinical Impression SW completed assessment with pt wife. Pt is not a Cytogeneticist. DME: crutches.   Zackrey Dyar A Annabelle Rexroad 10/03/2023, 3:28 PM

## 2023-10-03 NOTE — Progress Notes (Signed)
 Patient ID: Howard Johnson, male   DOB: February 28, 1956, 68 y.o.   MRN: [id number]  Per medical team, ot has short ELOS, and d/c date is 2/25. SW will follow-up with pt wife.   [id number] spoke with pt wife Howard Johnson to introduce self, explain role, inform on ELOS, gains made in rehab and d/c date 2/25. Confirms she will provide 24/7 care and their son will be helping them as well (moving into their home). Fam edu scheduled for Monday (2/24) 1pm-4pm with pt wife and son.  Cecile Sheerer, MSW, LCSW Office: [phone] Cell: [phone] Fax: [phone]

## 2023-10-03 NOTE — Care Management (Signed)
 Inpatient Rehabilitation Center Individual Statement of Services  Patient Name:  Aden Sek  Date:  10/03/2023  Welcome to the Inpatient Rehabilitation Center.  Our goal is to provide you with an individualized program based on your diagnosis and situation, designed to meet your specific needs.  With this comprehensive rehabilitation program, you will be expected to participate in at least 3 hours of rehabilitation therapies Monday-Friday, with modified therapy programming on the weekends.  Your rehabilitation program will include the following services:  Physical Therapy (PT), Occupational Therapy (OT), Speech Therapy (ST), 24 hour per day rehabilitation nursing, Therapeutic Recreaction (TR), Psychology, Neuropsychology, Care Coordinator, Rehabilitation Medicine, Nutrition Services, Pharmacy Services, and Other  Weekly team conferences will be held on Tuesdays to discuss your progress.  Your Inpatient Rehabilitation Care Coordinator will talk with you frequently to get your input and to update you on team discussions.  Team conferences with you and your family in attendance may also be held.  Expected length of stay: 3-5 days    Overall anticipated outcome: Supervision  Depending on your progress and recovery, your program may change. Your Inpatient Rehabilitation Care Coordinator will coordinate services and will keep you informed of any changes. Your Inpatient Rehabilitation Care Coordinator's name and contact numbers are listed  below.  The following services may also be recommended but are not provided by the Inpatient Rehabilitation Center:  Driving Evaluations Home Health Rehabiltiation Services Outpatient Rehabilitation Services Vocational Rehabilitation   Arrangements will be made to provide these services after discharge if needed.  Arrangements include referral to agencies that provide these services.  Your insurance has been verified to be:  [employer]age  Your primary  doctor is:  Nita Sells  Pertinent information will be shared with your doctor and your insurance company.  Inpatient Rehabilitation Care Coordinator:  Susie Cassette [phone] or ([phone]  Information discussed with and copy given to patient by: Gretchen Short, 10/03/2023, 11:24 AM

## 2023-10-03 NOTE — Plan of Care (Signed)
  Problem: Consults Goal: RH STROKE PATIENT EDUCATION Description: See Patient Education module for education specifics  Outcome: Progressing   Problem: RH BOWEL ELIMINATION Goal: RH STG MANAGE BOWEL WITH ASSISTANCE Description: STG Manage Bowel with toileting Assistance. Outcome: Progressing Goal: RH STG MANAGE BOWEL W/MEDICATION W/ASSISTANCE Description: STG Manage Bowel with Medication with mod I  Assistance. Outcome: Progressing   Problem: RH BLADDER ELIMINATION Goal: RH STG MANAGE BLADDER WITH ASSISTANCE Description: STG Manage Bladder With toileting Assistance Outcome: Progressing   Problem: RH SAFETY Goal: RH STG ADHERE TO SAFETY PRECAUTIONS W/ASSISTANCE/DEVICE Description: STG Adhere to Safety Precautions With cues Assistance/Device. Outcome: Progressing   Problem: RH COGNITION-NURSING Goal: RH STG USES MEMORY AIDS/STRATEGIES W/ASSIST TO PROBLEM SOLVE Description: STG Uses Memory Aids/Strategies With cues Assistance to Problem Solve. Outcome: Progressing   Problem: RH KNOWLEDGE DEFICIT Goal: RH STG INCREASE KNOWLEDGE OF DIABETES Description: Patient and family will be able to manage DM using educational resources for medications and dietary modification independently Outcome: Progressing Goal: RH STG INCREASE KNOWLEDGE OF HYPERTENSION Description: Patient and family will be able to manage HTN using educational resources for medications and dietary modification independently Outcome: Progressing Goal: RH STG INCREASE KNOWLEDGE OF STROKE PROPHYLAXIS Description: Patient and family will be able to manage secondary risk using educational resources for medications and dietary modification independently Outcome: Progressing

## 2023-10-03 NOTE — Patient Care Conference (Signed)
 Inpatient RehabilitationTeam Conference and Plan of Care Update Date: 10/03/23   Time: 1346 pm    Patient Name: Howard Johnson      Medical Record Number: [id number]  Date of Birth: February 26, 1956 Sex: Male         Room/Bed: 4W03C/4W03C-01 Payor Info: Payor: [employer] / Plan: [employer] HMO / Product Type: *No Product type* /    Admit Date/Time:  10/01/2023  5:20 PM  Primary Diagnosis:  Cerebral venous sinus thrombosis  Hospital Problems: Principal Problem:   Cerebral venous sinus thrombosis    Expected Discharge Date: Expected Discharge Date: 10/08/23  Team Members Present: Physician leading conference: Dr. Fanny Dance Social Worker Present: Cecile Sheerer, LCSWA Nurse Present: Konrad Dolores, RN PT Present: Darrold Span, PT OT Present: Jake Shark, OT SLP Present: Other (comment) Alvera Novel SLP)     Current Status/Progress Goal Weekly Team Focus  Bowel/Bladder   continence of bowel and bladder   Remain continent   Remain continent    Swallow/Nutrition/ Hydration   reg/thin independent           ADL's   supervision for functional ambulation and ADL participation, supervision needed for safety with all ADLs d/t apraxia   MODI   ADL safety, family ed, DME needs    Mobility   supervision overall, gait >200' without device supervision   modI ambulatory  barriers: global aphasia and apraxia; focus on sequencing, following one step commands, coordination    Communication   profound aphasia/apraxia   maxA   complete automatic and very functional naming tasks, simple 1 step commands, pt/family edu    Safety/Cognition/ Behavioral Observations  difficult to assess given severity of aphasia            Pain   No pain   Remain pain free   Remain pain free    Skin   No skin issues   Remain without skin breakdown  Remain without skin breakdown      Discharge Planning:  Pt will d/c to home with his wife who will provider 24/7  care. PRN support from their son that lives close to their home. Outpatient PT/OT/SLP at [facility]. Fam edu on Monday (2/24) with pt wife and son. SW will confim there are no barriers to discharge.   Team Discussion: Patient admitted post Subarachnoid hemorrhage/ venous sinus thrombosis with diplopia, apraxia and language of confusion.  Patient on target to meet rehab goals: yes  *See Care Plan and progress notes for long and short-term goals.   Revisions to Treatment Plan:  N/a   Teaching Needs: Safety, medications, transfers, toileting, dietary modifications, etc.   Current Barriers to Discharge: Decreased caregiver support and Home enviroment access/layout  Possible Resolutions to Barriers: Family education Outpatient follow -up      Medical Summary Current Status: Left temporal lobe IPH with CVST, rash, hypokalemia, Poorly controlled DM2, htn  Barriers to Discharge: Medical stability;Electrolyte abnormality;Self-care education;Renal Insufficiency/Failure  Barriers to Discharge Comments: Left temporal lobe IPH with CVST, rash, hypokalemia, Poorly controlled DM2, htn, CKD Possible Resolutions to [employer]: monitor BP, adjust insulin dose, nystatin cream, potassium replacement, encourage oral fluids   Continued Need for Acute Rehabilitation Level of Care: The patient requires daily medical management by a physician with specialized training in physical medicine and rehabilitation for the following reasons: Direction of a multidisciplinary physical rehabilitation program to maximize functional independence : Yes Medical management of patient stability for increased activity during participation in an intensive rehabilitation regime.: Yes Analysis  of laboratory values and/or radiology reports with any subsequent need for medication adjustment and/or medical intervention. : Yes   I attest that I was present, lead the team conference, and concur with the assessment and  plan of the team.   Konrad Dolores [facility] 10/04/2023, 11:38 AM

## 2023-10-03 NOTE — Progress Notes (Signed)
 Physical Therapy Session Note  Patient Details  Name: Howard Johnson MRN: [id number] Date of Birth: 1955-09-27  Today's Date: 10/03/2023 PT Individual Time: 5784-6962 PT Individual Time Calculation: 73 min  Short Term Goals: Week 1:  PT Short Term Goal 1 (Week 1): STGS = LTGs  Skilled Therapeutic Interventions/Progress Updates:    Pt presents in room in bed, indicating agreeable to therapy however communication limited secondary to global aphasia. Pt unable to report pain. Session focused on NMR for sequencing, command following, and coordination, gait training, and therapeutic activities for participating with self care tasks.  Pt completes bed mobility with supervision, therapist prompted pt to don shoes with pt beginning to complete however stands and ambulates to personal belongings to pick out change of clothes. Pt then doffs/dons clothes in standing requiring CGA for donning shirt and pants. Pt sits to finish donning shoes with supervision. Pt then ambulates to bathroom and completes toilet transfer supervision, completes 3/3 toileting tasks with supervision, continent of BM, charted. Pt completes hand hygiene with supervision standing at sink.  Pt ambulates with supervision to day room 150' and requires max verbal/visual cues to come to sitting on EOM. Therapist attempt to complete cone taps for BLE coordination with pt in standing however pt demonstrates significant difficult secondary to motor apraxia and global aphasia and demonstrates difficulty stopping activity requiring therapist to remove cones and max verbal/visual cues to sit. Pt then completes color matching activity with increasing difficulty to promote sequencing, problem solving and attention to task with pt placing matching colored cone on top of corresponding colored dot. Pt begins with activity while seated, in standing, and then while completing scavenger hunt to find cones around day room with cues for visual scanning, slight  increased difficulty finding obstacles on right side.  Pt then completes NMR for BLE coordination and dynamic standing balance with agility ladder, therapist providing verbal and visual demonstration prior to pt completing with pt demonstrating good carryover of task: - forward reciprocal gait in each square x4 trials - side stepping x4 trials bilaterally - in and outs x4 trials - backward step to gait x4 trials *CGA for task  Pt requires increased time to complete activities due to communication barrier however pt does demonstrate improved carryover of task with verbal and visual demonstration.  Pt returns to room and remains seated in recliner with all needs within reach, cal light in place and chair alarm donned and activated at end of session. Pt requires increased time to attempt to communicate limiting distractions for TV viewing in room.   Therapy Documentation Precautions:  Precautions Precautions: Fall Precaution/Restrictions Comments: expressive and receptive aphasia Restrictions Weight Bearing Restrictions Per Provider Order: No   Therapy/Group: Individual Therapy  Edwin Cap PT, DPT 10/03/2023, 4:34 PM

## 2023-10-03 NOTE — Progress Notes (Signed)
 Speech Language Pathology Daily Session Note  Patient Details  Name: Howard Johnson MRN: [id number] Date of Birth: 1955-09-10  Today's Date: 10/03/2023 SLP Individual Time: 1100-1200 SLP Individual Time Calculation (min): 60 min  Short Term Goals: Week 1: SLP Short Term Goal 1 (Week 1): STGs = LTGs d/t ELOS  Skilled Therapeutic Interventions:   Pt greeted in his room. He was awake/alert in his chair upon SLP arrival and very pleasant/cooperative throughout tx tasks targeting language. With may paraphasias and neologisms, he was able to report visual deficits to SLP. Anticipate double vision and floaters continue to negatively impact vision. SLP then faciliated functional reading tasks via unscrambling automatic sequences. He independently unscrambled 1-10 and the dow, and required only minA visual/verbal cues to unscramble the months of the year. Given articulatory distortions, he was able to verbalize the sequences with maxA. Best success noted with months. Attempted opposite completion task, though responses were only noted during 1/4 trials. Anticipate poor comprehension of task instructions negatively impacted success. Pt noted to repeat "cup" during confrontational naming task but the remaining 5 items were named via errorless learning. Also provided written word to assist w/ comprehension, though pt noted to perseverate on reading the letters (verbalized as numbers) aloud vs processing the whole word. Provided pt with numbers 1-20 and familiar words to copy for additional language tasks to complete outside of structured ST tx sessions. Of note, he was able to independently copy his name and date of birth during demo of task. He was left in his chair with the alarm set and call light within reach. Recommend cont ST per POC.   Pain  No pain reported.   Therapy/Group: Individual Therapy  Pati Gallo 10/03/2023, 12:37 PM

## 2023-10-04 DIAGNOSIS — R21 Rash and other nonspecific skin eruption: Secondary | ICD-10-CM | POA: Diagnosis not present

## 2023-10-04 DIAGNOSIS — N1831 Chronic kidney disease, stage 3a: Secondary | ICD-10-CM

## 2023-10-04 DIAGNOSIS — G08 Intracranial and intraspinal phlebitis and thrombophlebitis: Secondary | ICD-10-CM | POA: Diagnosis not present

## 2023-10-04 DIAGNOSIS — D72829 Elevated white blood cell count, unspecified: Secondary | ICD-10-CM | POA: Diagnosis not present

## 2023-10-04 DIAGNOSIS — E876 Hypokalemia: Secondary | ICD-10-CM | POA: Diagnosis not present

## 2023-10-04 LAB — BASIC METABOLIC PANEL
Anion gap: 9 (ref 5–15)
BUN: 26 mg/dL — ABNORMAL HIGH (ref 8–23)
CO2: 23 mmol/L (ref 22–32)
Calcium: 8.9 mg/dL (ref 8.9–10.3)
Chloride: 106 mmol/L (ref 98–111)
Creatinine, Ser: 1.61 mg/dL — ABNORMAL HIGH (ref 0.61–1.24)
GFR, Estimated: 46 mL/min — ABNORMAL LOW (ref >60)
Glucose, Bld: 111 mg/dL — ABNORMAL HIGH (ref 70–99)
Potassium: 3.9 mmol/L (ref 3.5–5.1)
Sodium: 138 mmol/L (ref 135–145)

## 2023-10-04 LAB — GLUCOSE, CAPILLARY
Glucose-Capillary: 105 mg/dL — ABNORMAL HIGH (ref 70–99)
Glucose-Capillary: 138 mg/dL — ABNORMAL HIGH (ref 70–99)
Glucose-Capillary: 114 mg/dL — ABNORMAL HIGH (ref 70–99)
Glucose-Capillary: 157 mg/dL — ABNORMAL HIGH (ref 70–99)

## 2023-10-04 NOTE — Progress Notes (Signed)
 Physical Therapy Session Note  Patient Details  Name: Howard Johnson MRN: [id number] Date of Birth: [birth year]  Today's Date: 10/04/2023 PT Individual Time: 0902-0958 PT Individual Time Calculation (min): 56 min   Short Term Goals: Week 1:  PT Short Term Goal 1 (Week 1): STGS = LTGs  Skilled Therapeutic Interventions/Progress Updates:     Pt received supine in bed and agrees to therapy. Pt performs supine to sit independently. Pt dons shoes at EOB with supervision and cueing to initiate. Pt stands independently, then ambulates x700' without AD, with close supervision and cueing to increase awareness of surroundings, as pt nearly ambulates into moving doors and other objects several times during bout. Pt continues to be extremely aphasic, both receptive and expressive in nature, and tends to repeat numbers. Following brief rest break, pt stands and performs activity in standing to challenge activity tolerance, balance, and cognition. Pt tasked with placing photograph cards in chronological order. Pt initially seems to have difficulty understanding task, but is able to complete after increased cues and time. Pt initially completes 4 picture sequences, and then activity is progressed to 6 picture sequences with pt standing on airex mat to provide unreliable somatosensory input. Pt requires min/mod cueing to complete accurately. Pt ambulates back to room. Left supine in bed with alarm intact and all needs within reach.    Therapy Documentation Precautions:  Precautions Precautions: Fall Precaution/Restrictions Comments: expressive and receptive aphasia Restrictions Weight Bearing Restrictions Per Provider Order: No   Therapy/Group: Individual Therapy  Beau Fanny, PT, DPT 10/04/2023, 4:06 PM

## 2023-10-04 NOTE — Progress Notes (Signed)
 Speech Language Pathology Daily Session Note  Patient Details  Name: Howard Johnson MRN: [id number] Date of Birth: [birth year]  Today's Date: 10/04/2023 SLP Individual Time: 1100-1204 SLP Individual Time Calculation (min): 64 min  Short Term Goals: Week 1: SLP Short Term Goal 1 (Week 1): STGs = LTGs d/t ELOS  Skilled Therapeutic Interventions:   Pt greeted in his room and assisted to the ST office. He ambulated independently to the office and required maxA visual/verbal cues to follow very functional directions re gathering supplies for tx session and directions. Once in the office, he was able to unscramble 1-20 independently and the dow/months of the year with minA visual cues. He verbalized 1-20 independently as well. He benefited from maxA visual/verbal cues to verbalize the dow/months, though ~85% accuracy noted when verbalizing the months. SLP facilitated additional reading comprehension task matching objects to word from fo 2. Given additional time, he accurately completed 6/8 trials. Reduced success noted w/ structured following directions task vs functional, as he required max/DEP cues to point to single items. He was able to answer simple Y/N questions in conversation w/ verbal modA cues required to clarify responses. At the end of tx tasks, he was assisted back to his room. His wife and son arrived and SLP provided education re aphasia, ST goals, and additional tasks to complete outside of tx sessions. He was left in his recliner with the alarm set. Recommend cont ST.   Pain  No pain reported  Therapy/Group: Individual Therapy  Pati Gallo 10/04/2023, 7:55 AM

## 2023-10-04 NOTE — Plan of Care (Signed)
  Problem: Consults Goal: RH STROKE PATIENT EDUCATION Description: See Patient Education module for education specifics  Outcome: Progressing   Problem: RH BOWEL ELIMINATION Goal: RH STG MANAGE BOWEL WITH ASSISTANCE Description: STG Manage Bowel with toileting Assistance. Outcome: Progressing Goal: RH STG MANAGE BOWEL W/MEDICATION W/ASSISTANCE Description: STG Manage Bowel with Medication with mod I  Assistance. Outcome: Progressing   Problem: RH BLADDER ELIMINATION Goal: RH STG MANAGE BLADDER WITH ASSISTANCE Description: STG Manage Bladder With toileting Assistance Outcome: Progressing   Problem: RH SAFETY Goal: RH STG ADHERE TO SAFETY PRECAUTIONS W/ASSISTANCE/DEVICE Description: STG Adhere to Safety Precautions With cues Assistance/Device. Outcome: Progressing   Problem: RH COGNITION-NURSING Goal: RH STG USES MEMORY AIDS/STRATEGIES W/ASSIST TO PROBLEM SOLVE Description: STG Uses Memory Aids/Strategies With cues Assistance to Problem Solve. Outcome: Progressing   Problem: RH KNOWLEDGE DEFICIT Goal: RH STG INCREASE KNOWLEDGE OF DIABETES Description: Patient and family will be able to manage DM using educational resources for medications and dietary modification independently Outcome: Progressing Goal: RH STG INCREASE KNOWLEDGE OF HYPERTENSION Description: Patient and family will be able to manage HTN using educational resources for medications and dietary modification independently Outcome: Progressing Goal: RH STG INCREASE KNOWLEDGE OF STROKE PROPHYLAXIS Description: Patient and family will be able to manage secondary risk using educational resources for medications and dietary modification independently Outcome: Progressing

## 2023-10-04 NOTE — Progress Notes (Addendum)
 PROGRESS NOTE   Subjective/Complaints: No acute events overnight.  No new concerns elicited.  Appears to indicate visual deficits.  ROS: Limited by language deficits, denies pain  Objective:   No results found. Recent Labs    10/02/23 0512 10/03/23 0535  WBC 11.1* 11.5*  HGB 15.0 14.9  HCT 45.2 44.7  PLT 277 306   Recent Labs    10/03/23 0535 10/04/23 0535  NA 139 138  K 3.4* 3.9  CL 105 106  CO2 24 23  GLUCOSE 113* 111*  BUN 26* 26*  CREATININE 1.54* 1.61*  CALCIUM 9.3 8.9    Intake/Output Summary (Last 24 hours) at 10/04/2023 1207 Last data filed at 10/04/2023 0800 Gross per 24 hour  Intake 598 ml  Output --  Net 598 ml        Physical Exam: Vital Signs Blood pressure 137/70, pulse 85, temperature 98 F (36.7 C), resp. rate 16, SpO2 97%.   General: No apparent distress HEENT: Head is normocephalic, atraumatic, sclera anicteric, oral mucosa pink and moist Neck: Supple without JVD or lymphadenopathy Heart: Reg rate and rhythm. No murmurs rubs or gallops Chest: CTA bilaterally without wheezes, rales, or rhonchi; no distress Abdomen: Soft, non-tender, non-distended, bowel sounds positive. Extremities: No clubbing, cyanosis, or edema. Pulses are 2+ Psych: Pt's affect is appropriate. Pt is cooperative Skin: Clean and intact without signs of breakdown Neuro:     Mental Status: Oriented to self.   Made eye contact.    Speech/Languate: Global aphasia.  Frequently saying different numbers. He was unable to follow simple motor commands without multimodal visual and tactile cues. Question visual deficits --diplopia per family but question mild right inattention?      CRANIAL NERVES: 2 through 12 grossly intact.     MOTOR: At least 4+/5 in b/l UE and LE     REFLEXES: no ankle clonus   SENSORY: Normal to pain all 4 extremities  Assessment/Plan: 1. Functional deficits which require 3+ hours per  day of interdisciplinary therapy in a comprehensive inpatient rehab setting. Physiatrist is providing close team supervision and 24 hour management of active medical problems listed below. Physiatrist and rehab team continue to assess barriers to discharge/monitor patient progress toward functional and medical goals  Care Tool:  Bathing    Body parts bathed by patient: Right arm, Left arm, Chest, Abdomen, Front perineal area, Buttocks, Right upper leg, Left upper leg, Left lower leg, Face, Right lower leg         Bathing assist Assist Level: Supervision/Verbal cueing     Upper Body Dressing/Undressing Upper body dressing   What is the patient wearing?: Pull over shirt    Upper body assist Assist Level: Set up assist    Lower Body Dressing/Undressing Lower body dressing      What is the patient wearing?: Underwear/pull up, Pants     Lower body assist Assist for lower body dressing: Supervision/Verbal cueing     Toileting Toileting    Toileting assist Assist for toileting: Supervision/Verbal cueing     Transfers Chair/bed transfer  Transfers assist     Chair/bed transfer assist level: Supervision/Verbal cueing     Locomotion Ambulation  Ambulation assist      Assist level: Contact Guard/Touching assist Assistive device: No Device Max distance: 300'   Walk 10 feet activity   Assist     Assist level: Contact Guard/Touching assist Assistive device: No Device   Walk 50 feet activity   Assist    Assist level: Contact Guard/Touching assist Assistive device: No Device    Walk 150 feet activity   Assist    Assist level: Contact Guard/Touching assist Assistive device: No Device    Walk 10 feet on uneven surface  activity   Assist     Assist level: Contact Guard/Touching assist     Wheelchair     Assist Is the patient using a wheelchair?: No             Wheelchair 50 feet with 2 turns activity    Assist             Wheelchair 150 feet activity     Assist          Blood pressure 137/70, pulse 85, temperature 98 F (36.7 C), resp. rate 16, SpO2 97%.   Medical Problem List and Plan: 1. 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  Antithrombotics: -DVT/anticoagulation:  Pharmaceutical: Eliquis             -antiplatelet therapy: N/A 3. Pain Management: Tylenol prn for pain 4. Mood/Behavior/Sleep: LCSW to follow for evaluation and support.              -antipsychotic agents: Seroquel prn for agitation.  5. Neuropsych/cognition: This patient is not capable of making decisions on his own behalf. 6. Skin/Wound Care: Routine pressure relief measures.  7. Fluids/Electrolytes/Nutrition: Monitor I/O. Check CMET in am 8. HTN: Monitor BP TID. BP improving. Continue to hold Norvasc.              -2/21 BP well-controlled, continue current regimen    10/04/2023    6:20 AM 10/03/2023    7:45 PM 10/03/2023    2:31 PM  Vitals with BMI  Systolic 137 128 621  Diastolic 70 76 80  Pulse 85 89     9. T2DM: Hgb A1c-12.4. Add CM restrictions to diet. Was on Farxiga and Rybelsus PTA.  --Monitor BS ac/hs and use SSI for elevated BS.             --Continue Insulin glargine with 3 units TID AC for meal coverage. Was on prednisone for sinus infection. -2/19 increase glargine from 18u to 20u   2/21 CBGs well-controlled, continue current regimen CBG (last 3)  Recent Labs    10/03/23 2108 10/04/23 0622 10/04/23 1206  GLUCAP 113* 105* 138*    10. Leucocytosis Monitor for fevers and other signs of infection. WBC down to 11 range.  --sinus infection was treated with Augmentin thru 09/29/23.  -2/20 WBC 11.5, elevated but overall stable from last few days.  Monitor for signs of  infection 11. CKD IIIa: BUN elevated--encourage fluid intake. SCr trending down from 1.68-->1.26.              -2/20 creatinine down  from 1.72 to 1.54, continue encourage oral intake  -2/21 creatinine 1.61, BUN 26 continue to encourage oral fluids 12. Constipation: Finally had BM last night and another after lunch. --Continue Senna 2 daily in am.   -2/21 LBM today, continue current regimen 13. Headaches/Facial pain?:  Rhinocort and Claritin added for facial pain/congestion. Will schedule tylenol bid.  14. Hypokalemia              -2/19 today potassium  2/20  K+ 3.4, potassium x2 today  2/21 potassium up to 3.9 15.  Penile rash  -Nystatin cream  LOS: 3 days A FACE TO FACE EVALUATION WAS PERFORMED  Fanny Dance 10/04/2023, 12:07 PM

## 2023-10-04 NOTE — Progress Notes (Signed)
 Occupational Therapy Session Note  Patient Details  Name: Howard Johnson MRN: [id number] Date of Birth: [birth year]  Today's Date: 10/04/2023 OT Individual Time: 7829-5621 OT Individual Time Calculation (min): 41 min    Short Term Goals: Week 1:  OT Short Term Goal 1 (Week 1): STG=LTG d/t ELOS  Skilled Therapeutic Interventions/Progress Updates:    Pt received in the recliner with no c/o pain, agreeable to OT session and shower. His wife and son were present. Provided education briefly as they will attend family edu on Monday. He stood from the recliner with (S) and completed functional mobility into the bathroom with (S). He doffed all clothing, requiring min cueing for sitting down to doff shoes. He completed a full shower in standing with (S). Good safety awareness. He returned to the recliner and dressed with with (S) overall- requiring min cueing for sitting down to don pants. He completed 200 ft of functional mobility to the therapy gym with close (S). He completed several sequencing activities on the BITS to challenge perceptual skills and motor planning through novel activities. He completed BITS visual scanning and sequencing through numerical and alphabetical 1-15, A- F. He was 58% accurate in 1-14, getting confused after 10. This was a great improvement from Tuesday when he required max cueing. He had more difficulty with the alphabet. Overall he demonstrated improved motor planning. He returned to his room and was left sitting up in the recliner with all needs met chair alarm set.   Therapy Documentation Precautions:  Precautions Precautions: Fall Precaution/Restrictions Comments: expressive and receptive aphasia Restrictions Weight Bearing Restrictions Per Provider Order: No  Therapy/Group: Individual Therapy  Crissie Reese 10/04/2023, 5:50 AM

## 2023-10-04 NOTE — Progress Notes (Signed)
 Patient ID: Howard Johnson, male   DOB: [birth year], 68 y.o.   MRN: [id number]  SW faxed outpatient PT/OT/SLP referral to [facility].   Cecile Sheerer, MSW, LCSW Office: [phone] Cell: [phone] Fax: [phone]

## 2023-10-04 NOTE — IPOC Note (Signed)
 Overall Plan of Care [facility]) Patient Details Name: Howard Johnson MRN: [id number] DOB: 09-Mar-1956  Admitting Diagnosis: Cerebral venous sinus thrombosis  Hospital Problems: Principal Problem:   Cerebral venous sinus thrombosis     Functional Problem List: Nursing Bladder, Bowel, Safety, Endurance, Medication Management  PT Balance, Behavior, Motor, Safety  OT Balance, Perception, Endurance, Motor, Vision  SLP Linguistic, Motor  TR         Basic ADL's: OT Toileting, Dressing, Bathing     Advanced  ADL's: OT       Transfers: PT Bed to Chair, Car, Furniture, Floor  OT Toilet, Tub/Shower     Locomotion: PT Ambulation, Stairs     Additional Impairments: OT None  SLP Communication comprehension, expression    TR      Anticipated Outcomes Item Anticipated Outcome  Self Feeding no goal  Swallowing      Basic self-care  mod I  Toileting  mod I   Bathroom Transfers mod I  Bowel/Bladder  manage bowel w mod I and bladder w toileting  Transfers  Independent  Locomotion  Supervision community, independent household  Communication  maxA  Cognition     Pain  n/a  Safety/Judgment  manage safety w cues   Therapy Plan: PT Intensity: Minimum of 1-2 x/day ,45 to 90 minutes PT Frequency: 5 out of 7 days PT Duration Estimated Length of Stay: 3-5 Days OT Intensity: Minimum of 1-2 x/day, 45 to 90 minutes OT Frequency: 5 out of 7 days OT Duration/Estimated Length of Stay: 3-5 days SLP Intensity: Minumum of 1-2 x/day, 30 to 90 minutes SLP Frequency: 3 to 5 out of 7 days SLP Duration/Estimated Length of Stay: ~3-5 days   Team Interventions: Nursing Interventions Patient/Family Education, Bladder Management, Medication Management, Discharge Planning, Bowel Management, Disease Management/Prevention  PT interventions Ambulation/gait training, Community reintegration, DME/adaptive equipment instruction, Neuromuscular re-education, Psychosocial support, Stair training,  UE/LE Strength taining/ROM, Warden/ranger, Discharge planning, Functional electrical stimulation, Pain management, Skin care/wound management, UE/LE Coordination activities, Therapeutic Activities, Cognitive remediation/compensation, Disease management/prevention, Functional mobility training, Patient/family education, Splinting/orthotics, Visual/perceptual remediation/compensation, Therapeutic Exercise  OT Interventions Balance/vestibular training, Discharge planning, Self Care/advanced ADL retraining, Therapeutic Activities, Therapeutic Exercise, Skin care/wound managment, Patient/family education, Functional mobility training, Visual/perceptual remediation/compensation, Community reintegration, Fish farm manager, Psychosocial support, UE/LE Strength taining/ROM  SLP Interventions Cueing hierarchy, Patient/family education, Multimodal communication approach, Speech/Language facilitation, Internal/external aids  TR Interventions    SW/CM Interventions Discharge Planning, Psychosocial Support, Patient/Family Education   Barriers to Discharge MD  Medical stability  Nursing Decreased caregiver support, Home environment access/layout 1 level 2 ste bil rails w spouse  PT      OT      SLP Other (comments) severity of deficits, ELOS  SW Decreased caregiver support, Lack of/limited family support, Community education officer for SNF coverage     Team Discharge Planning: Destination: PT-Home ,OT- Home , SLP-Home Projected Follow-up: PT-24 hour supervision/assistance, OT-  Outpatient OT, SLP-Outpatient SLP, 24 hour supervision/assistance Projected Equipment Needs: PT-To be determined, OT- None recommended by OT, SLP-None recommended by SLP Equipment Details: PT- , OT-  Patient/family involved in discharge planning: PT- Patient,  OT-Patient, SLP-Patient unable/family or caregive not available  MD ELOS: 5-7 Medical Rehab Prognosis:  Excellent Assessment: The patient has been admitted  for CIR therapies with the diagnosis of Left temporal lobe IPH with CVST . The team will be addressing functional mobility, strength, stamina, balance, safety, adaptive techniques and equipment, self-care, bowel and bladder mgt, patient and caregiver education. Goals have been  set at Mod I PT/OT, Max A communication. Anticipated discharge destination is home.        See Team Conference Notes for weekly updates to the plan of care

## 2023-10-04 NOTE — Progress Notes (Signed)
 Physical Therapy Session Note  Patient Details  Name: Howard Johnson MRN: [id number] Date of Birth: [birth year]  Today's Date: 10/04/2023 PT Individual Time: 1400-1445 PT Individual Time Calculation (min): 45 min   Short Term Goals: Week 1:  PT Short Term Goal 1 (Week 1): STGS = LTGs  Skilled Therapeutic Interventions/Progress Updates: Pt presents sitting in recliner and agreeable to therapy.Aphasia noted w/ all activities.  Pt transferred sit to stand w/ supervision throughout session from varying surfaces. Pt requires verbal and visual cueing.  Pt amb 600+ to 4N tower w/ some reluctance to go into darkened [place] hallway but then approves of windowed hallway.  Pt given verbal cues for following directions but still requires hands-on to stop and re-ask the question.  Pt performed horseshoe toss w/ cues for color to use but requires repeated cues.  Pt performed scavenger hunt w/ colored cones and able to perform if given the color of cone in his hand to look for.  Pt returned to room and remained sitting in recliner w/ chair alarm on and all needs in reach.     Therapy Documentation Precautions:  Precautions Precautions: Fall Precaution/Restrictions Comments: expressive and receptive aphasia Restrictions Weight Bearing Restrictions Per Provider Order: No General:   Vital Signs:   Pain:did not appear to have pain.     Therapy/Group: Individual Therapy  Lucio Edward 10/04/2023, 2:56 PM

## 2023-10-05 DIAGNOSIS — G08 Intracranial and intraspinal phlebitis and thrombophlebitis: Secondary | ICD-10-CM | POA: Diagnosis not present

## 2023-10-05 DIAGNOSIS — K5900 Constipation, unspecified: Secondary | ICD-10-CM | POA: Diagnosis not present

## 2023-10-05 DIAGNOSIS — E1165 Type 2 diabetes mellitus with hyperglycemia: Secondary | ICD-10-CM | POA: Diagnosis not present

## 2023-10-05 DIAGNOSIS — N1831 Chronic kidney disease, stage 3a: Secondary | ICD-10-CM | POA: Diagnosis not present

## 2023-10-05 LAB — GLUCOSE, CAPILLARY
Glucose-Capillary: 102 mg/dL — ABNORMAL HIGH (ref 70–99)
Glucose-Capillary: 162 mg/dL — ABNORMAL HIGH (ref 70–99)
Glucose-Capillary: 106 mg/dL — ABNORMAL HIGH (ref 70–99)
Glucose-Capillary: 71 mg/dL (ref 70–99)

## 2023-10-05 NOTE — Progress Notes (Signed)
 Occupational Therapy Session Note  Patient Details  Name: Howard Johnson MRN: [id number] Date of Birth: [birth year]  Today's Date: 10/05/2023 OT Individual Time: 4098-1191 OT Individual Time Calculation (min): 45 min    Short Term Goals: Week 1:  OT Short Term Goal 1 (Week 1): STG=LTG d/t ELOS  Skilled Therapeutic Interventions/Progress Updates:  Pt seen for skilled OT session. Focus on functional ADL am routine with integration of motor planning and coordination and functional language. Pt given choice to geneate RO information and for steps of simple mobility and ADL tasks. Pt bed level and amb with close S to and from bathroom for BM and seated voiding. Pt sat and stood for sink side sponge bathing, grooming and dressing with socks and tennis shoes with laces with set up close s and min cues for steps and motor planning. Pt then amb with OT to nursing desk with CGA fading to close s to retrieve newspaper. Reports intermittent but very little diplopia from what expression was clear to OT. Left recliner level with all needs, safety measures and nurse call button in reach.    Pain: denies and pain  Therapy Documentation Precautions:  Precautions Precautions: Fall Precaution/Restrictions Comments: expressive and receptive aphasia Restrictions Weight Bearing Restrictions Per Provider Order: No    Therapy/Group: Individual Therapy  Vicenta Dunning 10/05/2023, 7:30 AM

## 2023-10-05 NOTE — Progress Notes (Signed)
 Physical Therapy Session Note  Patient Details  Name: Howard Johnson MRN: [id number] Date of Birth: [birth year]  Today's Date: 10/05/2023 PT Individual Time: 4098-1191 PT Individual Time Calculation (min): 70 min   Short Term Goals: Week 1:  PT Short Term Goal 1 (Week 1): STGS = LTGs  Skilled Therapeutic Interventions/Progress Updates:    Pt presents in room seated in recliner,agreeable to PT. Pt indicates needing to use restroom, unable to report pain secondary to global aphasia. Session focused on gait training for tolerance to upright and NMR for BUE/BLE coordination, dynamic standing balance, and reciprocal gait mechanics, as well as therapeutic rest breaks and increased time to allow for communication barrier and apraxia with global aphasia. Pt completes all transfer with supervision throughout session.  Pt ambulates to/from restroom, toilet transfer, and LB dressing management and periarea hygiene with distant supervision. Pt completes hand hygiene with distant supervision. Pt demonstrates improved ability to follow verbal commands with functional tasks.  Pt ambulates >1000' from room to [facility] maintaining good gait speed throughout with distant supervision, noted to have decreased arm swing on RUE unable to correct with cues due to aphasia.  Pt completes NMR for BUE/BLE coordination and dual tasking while maintaining dynamic standing balance to facilitate BUE arm swing with gait including: - self ball toss with both hands seated, standing, marching x30 each and walking 175' - self ball toss between BUEs seated, standing, marching x30 and walking 175' *pt requires visual demonstration of all tasks due to global aphasia. - throwing bean bags with RUE to facilitate arm swing, 2x2.5 minutes (pt ambulates with supervision to pick up all bean bags following activity)  Pt completes gait training with 3# weight in RUE ~300' with max verbal/visual cues to facilitate arm swing, pt able to  maintain reciprocal arm swing ~25% of gait trial.  Pt completes NMR on nustep for facilitating reciprocal BUE/BLE arm/leg movements and coordination x10 minutes total, pt demonstrating increased speed with task maintaining 80-85 SPM with total 770 steps at workload 7, pt requires brief seated rest break at 5 min for ~30 seconds.  Pt completes reaching task in sitting, therapist asking for specific colored cones (blue, red, yellow) with pt able to reach for correct cone ~50% of task with therapist providing verbal cues to assist with sequencing and word/color association.  Pt returns to room ambulating with supervision and cue for reciprocal arm swing which pt is able to maintain for 75% of gait trial to room. Pt returns to sitting in recliner with all needs within reach, cal light in place and chair alarm donned and activated at end of session.   Therapy Documentation Precautions:  Precautions Precautions: Fall Precaution/Restrictions Comments: expressive and receptive aphasia Restrictions Weight Bearing Restrictions Per Provider Order: No   Therapy/Group: Individual Therapy  Edwin Cap PT, DPT 10/05/2023, 3:37 PM

## 2023-10-05 NOTE — Progress Notes (Signed)
 Speech Language Pathology Daily Session Note  Patient Details  Name: Howard Johnson MRN: [id number] Date of Birth: 1956/08/06  Today's Date: 10/05/2023 SLP Individual Time: 1000-1059; 1255-1340 SLP Individual Time Calculation (min): 59 min; 45 minutes   Short Term Goals: Week 1: SLP Short Term Goal 1 (Week 1): STGs = LTGs d/t ELOS  Skilled Therapeutic Interventions: Session 1: Skilled therapy session focused on communication goals. SLP facilitated session by providing mod A for patient to sequence numbers/words 1-10 and read aloud. Patient with consistent perseveration on name and higher level numbers with no awareness of mistakes. SLP attempted to increase awareness through providing voice recording, though unsuccessful. SLP continued to challenge patient through sequencing days of the week. Patient with occasional word approximations, though unable to sequence, nor verbalize entire words, despite totalA. SLP attempted to engage patient in writing "Monday," though unable to complete without hand over handA. Patient wrote own name given mod visual cues. Patient required max-totalA to follow single step directions and was unable to answer biographical y/n questions despite totalA. Patient left in chair with alarm set and call bell in reach. Continue POC.  Session 2: Afternoon therapy session focused on communication goals. SLP facilitated session by providing maxA for patient to match functional objects in [place]. Patient matched objects in FO4 with 70% accuracy given max visual and verbal cues. During receptive language activity, patient verbalized "cup, hammer, mirror, comb, pen, and plate" given a verbal model. Patient with continued perseverations, neologisms and semantic paraphasias (wood for ruler). SLP provided patient with five images and their cooresponding word for practicing with family until following ST session. Patient left in chair with alarm set and call bell in reach. Continue  POC  Pain Session 1: None verbalized/visualized Session 2: None verbalized/visualized  Therapy/Group: Individual Therapy  [name] M.A., CF-SLP 10/05/2023, 7:33 AM

## 2023-10-05 NOTE — Progress Notes (Signed)
 PROGRESS NOTE   Subjective/Complaints: No acute events overnight noted.  No new concerns elicited.  He reports he is doing well, denies pain but overall communication limited by aphasia.  He has newspaper by him, indicates he knows someone who was in the paper.  ROS: Limited by language deficits, denies pain  Objective:   No results found. Recent Labs    10/03/23 0535  WBC 11.5*  HGB 14.9  HCT 44.7  PLT 306   Recent Labs    10/03/23 0535 10/04/23 0535  NA 139 138  K 3.4* 3.9  CL 105 106  CO2 24 23  GLUCOSE 113* 111*  BUN 26* 26*  CREATININE 1.54* 1.61*  CALCIUM 9.3 8.9    Intake/Output Summary (Last 24 hours) at 10/05/2023 1407 Last data filed at 10/04/2023 2000 Gross per 24 hour  Intake 386 ml  Output --  Net 386 ml        Physical Exam: Vital Signs Blood pressure 128/76, pulse 71, temperature 97.8 F (36.6 C), resp. rate 16, SpO2 98%.   General: No apparent distress, laying in bed HEENT: Head is normocephalic, atraumatic, sclera anicteric, oral mucosa pink and moist Neck: Supple without JVD or lymphadenopathy Heart: Reg rate and rhythm. No murmurs rubs or gallops Chest: CTA bilaterally without wheezes, rales, or rhonchi; no distress Abdomen: Soft, non-tender, non-distended, bowel sounds positive. Extremities: No clubbing, cyanosis, or edema. Pulses are 2+ Psych: Pt's affect is appropriate. Pt is cooperative Skin: Clean and intact without signs of breakdown Neuro:     Mental Status: Oriented to self.   Made eye contact.    Speech/Languate: Global aphasia.  Frequently saying different numbers.  Appears to be unaware of deficits.  Occasional appropriate words/phrases.  He was unable to follow simple motor commands without multimodal visual and tactile cues. Question visual deficits --diplopia per family but question mild right inattention?  Able to count number of fingers .     CRANIAL NERVES: 2  through 12 grossly intact.     MOTOR: At least 4+/5 in b/l UE and LE     REFLEXES: no ankle clonus   SENSORY: Normal to pain all 4 extremities  Assessment/Plan: 1. Functional deficits which require 3+ hours per day of interdisciplinary therapy in a comprehensive inpatient rehab setting. Physiatrist is providing close team supervision and 24 hour management of active medical problems listed below. Physiatrist and rehab team continue to assess barriers to discharge/monitor patient progress toward functional and medical goals  Care Tool:  Bathing    Body parts bathed by patient: Right arm, Left arm, Chest, Abdomen, Front perineal area, Buttocks, Right upper leg, Left upper leg, Left lower leg, Face, Right lower leg         Bathing assist Assist Level: Supervision/Verbal cueing     Upper Body Dressing/Undressing Upper body dressing   What is the patient wearing?: Pull over shirt    Upper body assist Assist Level: Set up assist    Lower Body Dressing/Undressing Lower body dressing      What is the patient wearing?: Underwear/pull up, Pants     Lower body assist Assist for lower body dressing: Supervision/Verbal cueing  Toileting Toileting    Toileting assist Assist for toileting: Supervision/Verbal cueing     Transfers Chair/bed transfer  Transfers assist     Chair/bed transfer assist level: Supervision/Verbal cueing     Locomotion Ambulation   Ambulation assist      Assist level: Supervision/Verbal cueing Assistive device: No Device Max distance: 600+   Walk 10 feet activity   Assist     Assist level: Supervision/Verbal cueing Assistive device: No Device   Walk 50 feet activity   Assist    Assist level: Supervision/Verbal cueing Assistive device: No Device    Walk 150 feet activity   Assist    Assist level: Supervision/Verbal cueing Assistive device: No Device    Walk 10 feet on uneven surface  activity   Assist      Assist level: Contact Guard/Touching assist     Wheelchair     Assist Is the patient using a wheelchair?: No             Wheelchair 50 feet with 2 turns activity    Assist            Wheelchair 150 feet activity     Assist          Blood pressure 128/76, pulse 71, temperature 97.8 F (36.6 C), resp. rate 16, SpO2 98%.   Medical Problem List and Plan: 1. 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  Antithrombotics: -DVT/anticoagulation:  Pharmaceutical: Eliquis             -antiplatelet therapy: N/A 3. Pain Management: Tylenol prn for pain 4. Mood/Behavior/Sleep: LCSW to follow for evaluation and support.              -antipsychotic agents: Seroquel prn for agitation.  5. Neuropsych/cognition: This patient is not capable of making decisions on his own behalf. 6. Skin/Wound Care: Routine pressure relief measures.  7. Fluids/Electrolytes/Nutrition: Monitor I/O. Check CMET in am 8. HTN: Monitor BP TID. BP improving. Continue to hold Norvasc.              -2/22 blood pressure well-controlled, continue to monitor    10/05/2023    3:09 AM 10/04/2023    7:54 PM 10/04/2023    3:02 PM  Vitals with BMI  Systolic 128 125 161  Diastolic 76 81 82  Pulse 71 84 100    9. T2DM: Hgb A1c-12.4. Add CM restrictions to diet. Was on Farxiga and Rybelsus PTA.  --Monitor BS ac/hs and use SSI for elevated BS.             --Continue Insulin glargine with 3 units TID AC for meal coverage. Was on prednisone for sinus infection. -2/19 increase glargine from 18u to 20u   2/22 CBG was well-controlled overall, continue current medication CBG (last 3)  Recent Labs    10/04/23 2106 10/05/23 0631 10/05/23 1150  GLUCAP 157* 102* 162*    10. Leucocytosis Monitor for fevers  and other signs of infection. WBC down to 11 range.  --sinus infection was treated with Augmentin thru 09/29/23.  -2/20 WBC 11.5, elevated but overall stable from last few  days.  Monitor for signs of infection 11. CKD IIIa: BUN elevated--encourage fluid intake. SCr trending down from 1.68-->1.26.              -2/20 creatinine down from 1.72 to 1.54, continue encourage oral intake  -2/21 creatinine 1.61, BUN 26 continue to encourage oral fluids  Recheck Monday  -Continue to encourage fluids 12. Constipation: Finally had BM last night and another after lunch. --Continue Senna 2 daily in am.   -2/22 LBM yesterday continue to monitor 13. Headaches/Facial pain?:  Rhinocort and Claritin added for facial pain/congestion. Will schedule tylenol bid.  14. Hypokalemia              -2/19 today potassium  2/20  K+ 3.4, potassium x2 today  2/21 potassium up to 3.9 15.  Penile rash  -Nystatin cream  LOS: 4 days A FACE TO FACE EVALUATION WAS PERFORMED  Fanny Dance 10/05/2023, 2:07 PM

## 2023-10-06 DIAGNOSIS — E1165 Type 2 diabetes mellitus with hyperglycemia: Secondary | ICD-10-CM | POA: Diagnosis not present

## 2023-10-06 DIAGNOSIS — G08 Intracranial and intraspinal phlebitis and thrombophlebitis: Secondary | ICD-10-CM | POA: Diagnosis not present

## 2023-10-06 DIAGNOSIS — R21 Rash and other nonspecific skin eruption: Secondary | ICD-10-CM | POA: Diagnosis not present

## 2023-10-06 DIAGNOSIS — K5900 Constipation, unspecified: Secondary | ICD-10-CM | POA: Diagnosis not present

## 2023-10-06 LAB — GLUCOSE, CAPILLARY
Glucose-Capillary: 167 mg/dL — ABNORMAL HIGH (ref 70–99)
Glucose-Capillary: 105 mg/dL — ABNORMAL HIGH (ref 70–99)
Glucose-Capillary: 99 mg/dL (ref 70–99)
Glucose-Capillary: 130 mg/dL — ABNORMAL HIGH (ref 70–99)
Glucose-Capillary: 154 mg/dL — ABNORMAL HIGH (ref 70–99)

## 2023-10-06 NOTE — Progress Notes (Signed)
 PROGRESS NOTE   Subjective/Complaints: No new complaints or concerns elicited.  No acute events overnight  ROS: Limited by language deficits  Objective:   No results found. No results for input(s): "WBC", "HGB", "HCT", "PLT" in the last 72 hours.  Recent Labs    10/04/23 0535  NA 138  K 3.9  CL 106  CO2 23  GLUCOSE 111*  BUN 26*  CREATININE 1.61*  CALCIUM 8.9    Intake/Output Summary (Last 24 hours) at 10/06/2023 1449 Last data filed at 10/06/2023 0804 Gross per 24 hour  Intake 240 ml  Output --  Net 240 ml        Physical Exam: Vital Signs Blood pressure 135/84, pulse 91, temperature 98 F (36.7 C), temperature source Oral, resp. rate 17, SpO2 99%.   General: No apparent distress, sitting in bedside chair HEENT: Head is normocephalic, atraumatic, sclera anicteric, oral mucosa pink and moist Neck: Supple without JVD or lymphadenopathy Heart: Reg rate and rhythm. No murmurs rubs or gallops Chest: CTA bilaterally without wheezes, rales, or rhonchi; no distress Abdomen: Soft, non-tender, non-distended, bowel sounds positive. Extremities: No clubbing, cyanosis, or edema. Pulses are 2+ Psych: Pt's affect is appropriate. Pt is cooperative Skin: Red rash around penis appears to be improving Neuro:     Mental Status: Oriented to self.   Made eye contact.    Speech/Languate: Global aphasia.  Frequently saying different numbers.  Appears to be unaware of deficits.   He was still unable to follow simple motor commands consistently without multimodal visual and tactile cues. Question visual deficits --diplopia per family but question mild right inattention?  Able to count number of fingers .     CRANIAL NERVES: 2 through 12 grossly intact.     MOTOR: At least 4+/5 in b/l UE and LE     REFLEXES: no ankle clonus   SENSORY: Normal to pain all 4 extremities  Assessment/Plan: 1. Functional deficits which  require 3+ hours per day of interdisciplinary therapy in a comprehensive inpatient rehab setting. Physiatrist is providing close team supervision and 24 hour management of active medical problems listed below. Physiatrist and rehab team continue to assess barriers to discharge/monitor patient progress toward functional and medical goals  Care Tool:  Bathing    Body parts bathed by patient: Right arm, Left arm, Chest, Abdomen, Front perineal area, Buttocks, Right upper leg, Left upper leg, Left lower leg, Face, Right lower leg         Bathing assist Assist Level: Supervision/Verbal cueing     Upper Body Dressing/Undressing Upper body dressing   What is the patient wearing?: Pull over shirt    Upper body assist Assist Level: Set up assist    Lower Body Dressing/Undressing Lower body dressing      What is the patient wearing?: Underwear/pull up, Pants     Lower body assist Assist for lower body dressing: Supervision/Verbal cueing     Toileting Toileting    Toileting assist Assist for toileting: Supervision/Verbal cueing     Transfers Chair/bed transfer  Transfers assist     Chair/bed transfer assist level: Supervision/Verbal cueing     Locomotion Ambulation   Ambulation assist  Assist level: Supervision/Verbal cueing Assistive device: No Device Max distance: 600+   Walk 10 feet activity   Assist     Assist level: Supervision/Verbal cueing Assistive device: No Device   Walk 50 feet activity   Assist    Assist level: Supervision/Verbal cueing Assistive device: No Device    Walk 150 feet activity   Assist    Assist level: Supervision/Verbal cueing Assistive device: No Device    Walk 10 feet on uneven surface  activity   Assist     Assist level: Contact Guard/Touching assist     Wheelchair     Assist Is the patient using a wheelchair?: No             Wheelchair 50 feet with 2 turns activity    Assist             Wheelchair 150 feet activity     Assist          Blood pressure 135/84, pulse 91, temperature 98 F (36.7 C), temperature source Oral, resp. rate 17, SpO2 99%.   Medical Problem List and Plan: 1. 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  Antithrombotics: -DVT/anticoagulation:  Pharmaceutical: Eliquis             -antiplatelet therapy: N/A 3. Pain Management: Tylenol prn for pain 4. Mood/Behavior/Sleep: LCSW to follow for evaluation and support.              -antipsychotic agents: Seroquel prn for agitation.  5. Neuropsych/cognition: This patient is not capable of making decisions on his own behalf. 6. Skin/Wound Care: Routine pressure relief measures.  7. Fluids/Electrolytes/Nutrition: Monitor I/O. Check CMET in am 8. HTN: Monitor BP TID. BP improving. Continue to hold Norvasc.              -2/23 BP controlled, continue current regimen    10/06/2023    1:00 PM 10/06/2023    4:18 AM 10/05/2023    8:00 PM  Vitals with BMI  Systolic 135 113 161  Diastolic 84 76 96  Pulse 91 71 74    9. T2DM: Hgb A1c-12.4. Add CM restrictions to diet. Was on Farxiga and Rybelsus PTA.  --Monitor BS ac/hs and use SSI for elevated BS.             --Continue Insulin glargine with 3 units TID AC for meal coverage. Was on prednisone for sinus infection. -2/19 increase glargine from 18u to 20u   2/23 CBGs improving, will discontinue mealtime insulin, addition to help simplify regimen CBG (last 3)  Recent Labs    10/06/23 0202 10/06/23 0608 10/06/23 1212  GLUCAP 167* 105* 99    10. Leucocytosis Monitor for fevers and other signs of infection. WBC down to 11 range.  --sinus infection was treated with Augmentin thru 09/29/23.  -2/20 WBC 11.5, elevated but overall stable from last few  days.  Monitor for signs of infection Recheck tomorrow 11. CKD IIIa: BUN elevated--encourage fluid intake. SCr trending down from 1.68-->1.26.              -2/20 creatinine down from 1.72 to 1.54,  continue encourage oral intake  -2/21 creatinine 1.61, BUN 26 continue to encourage oral fluids  Recheck Monday  -Continue to encourage fluids- will ask nursing to encourage too  -Recheck labs tomorrow  12. Constipation: Finally had BM last night and another after lunch. --Continue Senna 2 daily in am.   -2/23 LBM today 13. Headaches/Facial pain?:  Rhinocort and Claritin added for facial pain/congestion. Will schedule tylenol bid.  14. Hypokalemia              -2/19 today potassium  2/20  K+ 3.4, potassium x2 today  2/21 potassium up to 3.9 15.  Penile rash  -- Continue Nystatin cream, appears to be improving  LOS: 5 days A FACE TO FACE EVALUATION WAS PERFORMED  Fanny Dance 10/06/2023, 2:49 PM

## 2023-10-06 NOTE — Progress Notes (Signed)
 Speech Language Pathology Daily Session Note  Patient Details  Name: Howard Johnson MRN: [id number] Date of Birth: Jun 26, 1956  Today's Date: 10/06/2023 SLP Individual Time: 0927-1010 SLP Individual Time Calculation (min): 43 min  Short Term Goals: Week 1: SLP Short Term Goal 1 (Week 1): STGs = LTGs d/t ELOS  Skilled Therapeutic Interventions:   Pt seen for skilled SLP intervention to address expressive and receptive language goals. Pt eager to offer explanations and stories. Running speech is clear and intelligible, though nonsensical. He needed moderate re-direction to attempt structured tasks due to lack of auditory comprehension and auditory feedback loop. SLP facilitated practice of pt's name and names of immediate family members with both verbal and written feedback. Pt able to state own name clearly, though perseverated on "Karver" when naming his wife and son. He is unaware of his language errors. Attempted simple naming and repetition task with functional objects around room. Pt able to demonstrate object function easily, though challenged to verbalize correct name despite max verbal cues. Pt left sitting up in chair with chair alarm set and call bell in reach. Continue SLP PoC.   Pain Pain Assessment Pain Scale: 0-10 Pain Score: 0-No pain  Therapy/Group: Individual Therapy  Ellery Plunk 10/06/2023, 1:32 PM

## 2023-10-07 ENCOUNTER — Other Ambulatory Visit (HOSPITAL_COMMUNITY): Payer: Self-pay

## 2023-10-07 DIAGNOSIS — R4701 Aphasia: Secondary | ICD-10-CM

## 2023-10-07 DIAGNOSIS — G08 Intracranial and intraspinal phlebitis and thrombophlebitis: Secondary | ICD-10-CM | POA: Diagnosis not present

## 2023-10-07 LAB — FACTOR 5 LEIDEN

## 2023-10-07 LAB — CBC
HCT: 43.6 % (ref 39.0–52.0)
Hemoglobin: 14.5 g/dL (ref 13.0–17.0)
MCH: 27.6 pg (ref 26.0–34.0)
MCHC: 33.3 g/dL (ref 30.0–36.0)
MCV: 83 fL (ref 80.0–100.0)
Platelets: 308 10*3/uL (ref 150–400)
RBC: 5.25 MIL/uL (ref 4.22–5.81)
RDW: 14.8 % (ref 11.5–15.5)
WBC: 9.8 10*3/uL (ref 4.0–10.5)
nRBC: 0 % (ref 0.0–0.2)

## 2023-10-07 LAB — PROTHROMBIN GENE MUTATION

## 2023-10-07 LAB — GLUCOSE, CAPILLARY
Glucose-Capillary: 110 mg/dL — ABNORMAL HIGH (ref 70–99)
Glucose-Capillary: 135 mg/dL — ABNORMAL HIGH (ref 70–99)
Glucose-Capillary: 130 mg/dL — ABNORMAL HIGH (ref 70–99)
Glucose-Capillary: 199 mg/dL — ABNORMAL HIGH (ref 70–99)

## 2023-10-07 LAB — BASIC METABOLIC PANEL
Anion gap: 7 (ref 5–15)
BUN: 20 mg/dL (ref 8–23)
CO2: 26 mmol/L (ref 22–32)
Calcium: 9.1 mg/dL (ref 8.9–10.3)
Chloride: 103 mmol/L (ref 98–111)
Creatinine, Ser: 1.74 mg/dL — ABNORMAL HIGH (ref 0.61–1.24)
GFR, Estimated: 42 mL/min — ABNORMAL LOW (ref >60)
Glucose, Bld: 108 mg/dL — ABNORMAL HIGH (ref 70–99)
Potassium: 3.9 mmol/L (ref 3.5–5.1)
Sodium: 136 mmol/L (ref 135–145)

## 2023-10-07 MED ORDER — INSULIN PEN NEEDLE 32G X 4 MM MISC
0 refills | Status: DC
  Filled 2023-10-07: qty 100, 100d supply, fill #0

## 2023-10-07 MED ORDER — APIXABAN 5 MG PO TABS
5.0000 mg | ORAL_TABLET | Freq: Two times a day (BID) | ORAL | 0 refills | Status: AC
  Filled 2023-10-07: qty 60, 30d supply, fill #0

## 2023-10-07 MED ORDER — INSULIN GLARGINE 100 UNIT/ML SOLOSTAR PEN
20.0000 [IU] | PEN_INJECTOR | Freq: Every day | SUBCUTANEOUS | 0 refills | Status: DC
  Filled 2023-10-07: qty 15, 75d supply, fill #0

## 2023-10-07 MED ORDER — FLUTICASONE PROPIONATE 50 MCG/ACT NA SUSP
2.0000 | Freq: Every day | NASAL | 0 refills | Status: DC
  Filled 2023-10-07: qty 16, 30d supply, fill #0

## 2023-10-07 MED ORDER — ROSUVASTATIN CALCIUM 10 MG PO TABS
10.0000 mg | ORAL_TABLET | Freq: Every day | ORAL | 0 refills | Status: AC
  Filled 2023-10-07: qty 30, 30d supply, fill #0

## 2023-10-07 MED ORDER — LIVING WELL WITH DIABETES BOOK
Freq: Once | Status: AC
  Filled 2023-10-07: qty 1

## 2023-10-07 MED ORDER — SENNOSIDES-DOCUSATE SODIUM 8.6-50 MG PO TABS: 2.0000 | ORAL_TABLET | Freq: Every day | ORAL | Status: DC

## 2023-10-07 MED ORDER — LORATADINE 10 MG PO TABS
10.0000 mg | ORAL_TABLET | Freq: Every day | ORAL | 0 refills | Status: DC
  Filled 2023-10-07: qty 30, 30d supply, fill #0

## 2023-10-07 MED ORDER — ACETAMINOPHEN 325 MG PO TABS: 650.0000 mg | ORAL_TABLET | Freq: Two times a day (BID) | ORAL | Status: DC

## 2023-10-07 NOTE — Progress Notes (Signed)
 Speech Language Pathology Discharge Summary  Patient Details  Name: Rees Santistevan MRN: [id number] Date of Birth: 1955-08-30  Date of Discharge from SLP service:October 07, 2023  Today's Date: 10/07/2023  SLP Individual Time: 1100-1200 SLP Individual Time Calculation (min): 60 min  SLP Individual Time: 1345-1436 SLP Individual Time Calculation (min): 51 min   Skilled Therapeutic Interventions:   1100- Pt greeted at bedside; he was in his recliner upon SLP arrival. He was very pleasant and motivated throughout tx tasks targeting language. He completed a functional reading comprehension task via unscrambling automatics. He unscrambled 1-20 independently and benefited from minA visual cues to unscramble the dow and the months. He verbalized all automatics with maxA multimodal cues. He also completed a reading task matching single word to object from fo 2 choices. He completed 7/8 trials accurately. He was then able to name 5/8 items with maxA visual/verbal cues and copied the words with only minA. Providing the written word appeared to assist with spontaneous word production during confrontational naming task. Additionally, slightly improved repetition noted as compared to prev tx sessions but he still remains ~10% accurate. Throughout structured tasks, he was able to follow very functional 1 step commands with maxA multimodal cues. He was left with his call light within reach and chair alarm on.   1345-  The pt, his wife, and son were greeted in his room. He was again in his recliner upon SLP arrival and participative throughout tx tasks targeting language. SLP facilitated object to word matching task fo 2, confrontational naming, and writing (copying) task w/ 5 items to demo for his son and wife. SLP provided extensive education re purpose of tasks, targeting multiple aspects of language, helpful cues and communication tips, and stroke recovery. They verbalized understanding of education and  additional tx tasks were provided for the pt to contiue at home. Direct hand off completed with PT.     Patient has met 3 of 3 long term goals.  Patient to discharge at overall Max level.  Reasons goals not met:   n/a  Clinical Impression/Discharge Summary: ***  Care Partner:  Caregiver Able to Provide Assistance: Yes  Type of Caregiver Assistance: Cognitive  Recommendation:  Outpatient SLP;24 hour supervision/assistance  Rationale for SLP Follow Up: Maximize functional communication;Reduce caregiver burden   Equipment: n/a   Reasons for discharge: Discharged from hospital   Patient/Family Agrees with Progress Made and Goals Achieved: Yes    Pati Gallo 10/07/2023, 7:42 PM

## 2023-10-07 NOTE — Progress Notes (Signed)
 Occupational Therapy Session Note  Patient Details  Name: Howard Johnson MRN: [id number] Date of Birth: [birth year]  Today's Date: 10/07/2023  Session 1 OT Individual Time: 4098-1191 OT Individual Time Calculation (min): 60 min   Session 2 OT Individual Time: 1300-1345 OT Individual Time Calculation (min): 45 min    Short Term Goals: Week 1:  OT Short Term Goal 1 (Week 1): STG=LTG d/t ELOS  Skilled Therapeutic Interventions/Progress Updates:  Session 1   Pt greeted semi-reclined in bed awake and agreeable to OT treatment session focused on self-care retraining, activity tolerance, balance, and safety awareness. Pt ambulatory throughout session mod I without LOB and no device. Pt voided bladder, then completed shower with demonstration needed for safety awareness due to aphasia and apraxia. Supervision for bathing/dressing tasks with cues for initiation and occasional sequencing. Pt then ambulated to therapy gym and worked on dual task activity of bicep curl with 3 lb weight while also ambulating. Pt then reported he needed to go to the bathroom, so pt ambulated back to room and had successful BM. Mod I for hygiene. Pt left seated in recliner at end of session with alarm on, call bell in reach, and needs met.   Session 2 Pt greeted seated in recliner with son and wife present for family education. Education provided regarding safe BADL performance in home environment, home modifications, DME needs, energy conservation techniques, and safety awareness. OT educated spouse on providing visual cues and demonstration if pt is not understanding what she is asking him to do. Pt ambulated to therapy apartment without device and supervision. Practiced stepping over tub ledge in simulated home environment using grab bar and close supervision. Pt ambulated back to dayroom where  OT issued home UB exercise program and went through each exercise 10x. Pt needed max cues and demonstration to understand body  mechanics of each exercise.  Straight Arm Pulls x10 Straight Arm Raise x10 Side Arm Raise x10 Over Head Pull Down x10 Forearm Pull x10 Triceps Elbow Extension x10 Bicep Curl x10  Pt ambulated back to room and left seated in recliner with alarm on, call bell in reach and needs met.   Therapy Documentation Precautions:  Precautions Precautions: Fall Precaution/Restrictions Comments: expressive and receptive aphasia Restrictions Weight Bearing Restrictions Per Provider Order: No Pain: Pain Assessment Pain Score: 0-No pain   Therapy/Group: Individual Therapy  [name] 10/07/2023, 2:13 PM

## 2023-10-07 NOTE — Progress Notes (Signed)
 Patient ID: Howard Johnson, male   DOB: Jul 27, 1956, 67 y.o.   MRN: [id number]  SW met with pt wife and pt son to review discharge. She is aware she will need to follow-up with outpatient clinic to schedule her husband's therapies.   Cecile Sheerer, MSW, LCSW Office: [phone] Cell: [phone] Fax: [phone]

## 2023-10-07 NOTE — Progress Notes (Signed)
 PROGRESS NOTE   Subjective/Complaints: No events overnight. Vitals stable Last BM 2/23, large   ROS: Limited by language deficits  Objective:   No results found. Recent Labs    10/07/23 0557  WBC 9.8  HGB 14.5  HCT 43.6  PLT 308    Recent Labs    10/07/23 0557  NA 136  K 3.9  CL 103  CO2 26  GLUCOSE 108*  BUN 20  CREATININE 1.74*  CALCIUM 9.1    Intake/Output Summary (Last 24 hours) at 10/07/2023 0805 Last data filed at 10/07/2023 0800 Gross per 24 hour  Intake 720 ml  Output --  Net 720 ml        Physical Exam: Vital Signs Blood pressure 127/77, pulse 70, temperature 98.5 F (36.9 C), temperature source Oral, resp. rate 18, SpO2 96%.   General: No apparent distress, sitting in bedside chair HEENT: Head is normocephalic, atraumatic, sclera anicteric, oral mucosa pink and moist Neck: Supple without JVD or lymphadenopathy Heart: Reg rate and rhythm. No murmurs rubs or gallops Chest: CTA bilaterally without wheezes, rales, or rhonchi; no distress Abdomen: Soft, non-tender, non-distended, bowel sounds positive. Extremities: No clubbing, cyanosis, or edema. Pulses are 2+ Psych: Pt's affect is appropriate. Pt is cooperative Skin: Red rash around penis appears to be improving Neuro:     Mental Status: Oriented to self.   Made eye contact.    Speech/Languate: Global aphasia.  Frequently saying different numbers.  Appears to be unaware of deficits.   He was still unable to follow simple motor commands consistently without multimodal visual and tactile cues. Question visual deficits --diplopia per family but question mild right inattention?  Able to count number of fingers .     CRANIAL NERVES: 2 through 12 grossly intact.     MOTOR: At least 4+/5 in b/l UE and LE     REFLEXES: no ankle clonus   SENSORY: Normal to pain all 4 extremities  Assessment/Plan: 1. Functional deficits which require 3+  hours per day of interdisciplinary therapy in a comprehensive inpatient rehab setting. Physiatrist is providing close team supervision and 24 hour management of active medical problems listed below. Physiatrist and rehab team continue to assess barriers to discharge/monitor patient progress toward functional and medical goals  Care Tool:  Bathing    Body parts bathed by patient: Right arm, Left arm, Chest, Abdomen, Front perineal area, Buttocks, Right upper leg, Left upper leg, Left lower leg, Face, Right lower leg         Bathing assist Assist Level: Supervision/Verbal cueing     Upper Body Dressing/Undressing Upper body dressing   What is the patient wearing?: Pull over shirt    Upper body assist Assist Level: Set up assist    Lower Body Dressing/Undressing Lower body dressing      What is the patient wearing?: Underwear/pull up, Pants     Lower body assist Assist for lower body dressing: Supervision/Verbal cueing     Toileting Toileting    Toileting assist Assist for toileting: Supervision/Verbal cueing     Transfers Chair/bed transfer  Transfers assist     Chair/bed transfer assist level: Supervision/Verbal cueing  Locomotion Ambulation   Ambulation assist      Assist level: Supervision/Verbal cueing Assistive device: No Device Max distance: 600+   Walk 10 feet activity   Assist     Assist level: Supervision/Verbal cueing Assistive device: No Device   Walk 50 feet activity   Assist    Assist level: Supervision/Verbal cueing Assistive device: No Device    Walk 150 feet activity   Assist    Assist level: Supervision/Verbal cueing Assistive device: No Device    Walk 10 feet on uneven surface  activity   Assist     Assist level: Contact Guard/Touching assist     Wheelchair     Assist Is the patient using a wheelchair?: No             Wheelchair 50 feet with 2 turns activity    Assist             Wheelchair 150 feet activity     Assist          Blood pressure 127/77, pulse 70, temperature 98.5 F (36.9 C), temperature source Oral, resp. rate 18, SpO2 96%.   Medical Problem List and Plan: 1. 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  Antithrombotics: -DVT/anticoagulation:  Pharmaceutical: Eliquis             -antiplatelet therapy: N/A 3. Pain Management: Tylenol prn for pain 4. Mood/Behavior/Sleep: LCSW to follow for evaluation and support.              -antipsychotic agents: Seroquel prn for agitation.  5. Neuropsych/cognition: This patient is not capable of making decisions on his own behalf. 6. Skin/Wound Care: Routine pressure relief measures.  7. Fluids/Electrolytes/Nutrition: Monitor I/O. Check CMET in am 8. HTN: Monitor BP TID. BP improving. Continue to hold Norvasc.              -2/23 BP controlled, continue current regimen    10/07/2023    4:38 AM 10/06/2023    8:30 PM 10/06/2023    1:00 PM  Vitals with BMI  Systolic 127 118 161  Diastolic 77 72 84  Pulse 70 74 91    9. T2DM: Hgb A1c-12.4. Add CM restrictions to diet. Was on Farxiga and Rybelsus PTA.  --Monitor BS ac/hs and use SSI for elevated BS.             --Continue Insulin glargine with 3 units TID AC for meal coverage. Was on prednisone for sinus infection. -2/19 increase glargine from 18u to 20u   2/23 CBGs improving, will discontinue mealtime insulin, addition to help simplify regimen CBG (last 3)  Recent Labs    10/06/23 1701 10/06/23 2145 10/07/23 0603  GLUCAP 130* 154* 110*    10. Leucocytosis Monitor for fevers and other signs of infection. WBC down to 11 range.  --sinus infection was treated with Augmentin thru 09/29/23.  -2/20 WBC 11.5, elevated but overall stable from last few  days.  Monitor for signs of infection Recheck tomorrow 11. CKD IIIa: BUN elevated--encourage fluid intake. SCr trending down from 1.68-->1.26.              -  2/20 creatinine down from 1.72 to 1.54, continue encourage oral intake  -2/21 creatinine 1.61, BUN 26 continue to encourage oral fluids  -Continue to encourage fluids- will ask nursing to encourage too  -2/24: BUN down to 20, Cr remains 1.74;  Intakes 720 ccs yesterday; continue to encourage PO, recheck Wed  12. Constipation: Finally had BM last night and another after lunch. --Continue Senna 2 daily in am.   -2/23 LBM today  13. Headaches/Facial pain?:  Rhinocort and Claritin added for facial pain/congestion. Will schedule tylenol bid.  14. Hypokalemia              -2/19 today potassium  2/20  K+ 3.4, potassium x2 today  2/21 potassium up to 3.9  2/24: stable 3/9  15.  Penile rash  -- Continue Nystatin cream, appears to be improving  LOS: 6 days A FACE TO FACE EVALUATION WAS PERFORMED  Angelina Sheriff 10/07/2023, 8:05 AM

## 2023-10-07 NOTE — Progress Notes (Incomplete)
 Inpatient Rehabilitation Discharge Medication Review by a Pharmacist  A complete drug regimen review was completed for this patient to identify any potential clinically significant medication issues.  High Risk Drug Classes Is patient taking? Indication by Medication  Antipsychotic No   nticoagulant Yes Apixaban - central venous thrombosis, stroke prophylaxis   Antibiotic No   Opioid No   Antiplatelet No   Hypoglycemics/insulin Yes Insulin glargine - DM Type 2  Vasoactive Medication No   Chemotherapy No   Other Yes Acetaminophen (scheduled BID) - pain Fluticasone nasal, loratadine - congestion Rosuvastatin - hyperlipidemia Senna-docusate - constipation      Type of Medication Issue Identified Description of Issue Recommendation(s)  Drug Interaction(s) (clinically significant)     Duplicate Therapy     Allergy     No Medication Administration End Date     Incorrect Dose     Additional Drug Therapy Needed     Significant med changes from prior encounter (inform family/care partners about these prior to discharge). Discontinued during inpatient admit: amlodipine, lisinopril, dapagliflozin, semaglutide PO. Completed Augmentin and Prednisone courses prior to admit. Quetiapine PRN discontinued, last dose given 10/01/23. All other meds new except Rosuvastatin Communicate changes with patient/family prior to discharge.  Other       Clinically significant medication issues were identified that warrant physician communication and completion of prescribed/recommended actions by midnight of the next day:  No  Pharmacist comments:   Time spent performing this drug regimen review (minutes):  20   Thank you for involving pharmacy in this patient's care.  Dennie Fetters, [place] 10/07/2023 4:55 PM

## 2023-10-07 NOTE — Progress Notes (Signed)
 Occupational Therapy Discharge Summary  Patient Details  Name: Howard Johnson MRN: [id number] Date of Birth: 19-Mar-1956  Date of Discharge from OT service:October 07, 2023  Patient has met 7 of 7 long term goals due to improved activity tolerance, improved balance, postural control, ability to compensate for deficits, and improved awareness.  Patient to discharge at overall Modified Independent level.  Patient's care partner is independent to provide the necessary cognitive assistance at discharge for higher level iADL tasks.    Reasons goals not met: n/a  Recommendation:  Patient will benefit from ongoing skilled OT services in outpatient setting to continue to advance functional skills in the area of BADL, iADL, and Reduce care partner burden.  Equipment: No equipment provided  Reasons for discharge: treatment goals met and discharge from hospital  Patient/family agrees with progress made and goals achieved: Yes  OT Discharge Precautions/Restrictions  Precautions Precautions: Fall Precaution/Restrictions Comments: expressive and receptive aphasia Restrictions Weight Bearing Restrictions Per Provider Order: No Vital Signs Therapy Vitals Temp: 98.6 F (37 C) Pulse Rate: 87 Resp: 18 BP: (!) 155/91 Patient Position (if appropriate): Lying Oxygen Therapy SpO2: 100 % O2 Device: Room Air Pain  Denies pain ADL ADL Eating: Independent Where Assessed-Eating: Chair Grooming: Modified independent Where Assessed-Grooming: Standing at sink Upper Body Bathing: Modified independent Where Assessed-Upper Body Bathing: Shower Lower Body Bathing: Modified independent Where Assessed-Lower Body Bathing: Shower Upper Body Dressing: Modified independent (Device) Where Assessed-Upper Body Dressing: Edge of bed Lower Body Dressing: Modified independent Where Assessed-Lower Body Dressing: Edge of bed Toileting: Modified independent Where Assessed-Toileting: Teacher, adult education:  Engineer, agricultural Method: Event organiser: Close supervision Film/video editor Method: [employer]  Perception: Within Functional Limits Praxis Praxis: Impaired Praxis Impairment Details: Motor planning Cognition Cognition Overall Cognitive Status: Difficult to assess Arousal/Alertness: Awake/alert Attention: Focused;Sustained Focused Attention: Appears intact Sustained Attention: Appears intact Sustained Attention Impairment: Verbal basic;Functional basic Safety/Judgment: Impaired Comments: receptive and expressive aphasia, difficulty expressing Brief Interview for Mental Status (BIMS) Repetition of Three Words (First Attempt): Nonsensical Temporal Orientation: Year: Nonsensical Temporal Orientation: Month: Nonsensical Temporal Orientation: Day: No answer Recall: "Sock": No, could not recall Recall: "Blue": No, could not recall Recall: "Bed": No, could not recall BIMS Summary Score: 99 Sensation Sensation Light Touch: Appears Intact Hot/Cold: Appears Intact Proprioception: Appears Intact Coordination Gross Motor Movements are Fluid and Coordinated: Yes Fine Motor Movements are Fluid and Coordinated: Yes Finger Nose Finger Test: Unable to motor plan Motor  Motor Motor: Within Functional Limits Mobility  Bed Mobility Bed Mobility: Supine to Sit;Sit to Supine Supine to Sit: Independent Sit to Supine: Independent Transfers Sit to Stand: Independent Stand to Sit: Independent  Trunk/Postural Assessment  Cervical Assessment Cervical Assessment: Within Functional Limits Thoracic Assessment Thoracic Assessment: Within Functional Limits Lumbar Assessment Lumbar Assessment: Within Functional Limits Postural Control Postural Control: Within Functional Limits  Balance Balance Balance Assessed: Yes Static Sitting Balance Static Sitting - Level of Assistance: 7: Independent Dynamic Sitting Balance Dynamic Sitting -  Balance Support: Feet supported;No upper extremity supported Dynamic Sitting - Level of Assistance: 7: Independent Static Standing Balance Static Standing - Balance Support: No upper extremity supported Static Standing - Level of Assistance: 6: Modified independent (Device/Increase time) Dynamic Standing Balance Dynamic Standing - Balance Support: No upper extremity supported Dynamic Standing - Level of Assistance: 6: Modified independent (Device/Increase time) Extremity/Trunk Assessment RUE Assessment RUE Assessment: Within Functional Limits LUE Assessment LUE Assessment: Within Functional Limits   [name] 10/07/2023, 9:32 PM

## 2023-10-07 NOTE — Plan of Care (Signed)
  Problem: RH Balance Goal: LTG Patient will maintain dynamic standing with ADLs (OT) Description: LTG:  Patient will maintain dynamic standing balance with assist during activities of daily living (OT)  Outcome: Completed/Met   Problem: RH Bathing Goal: LTG Patient will bathe all body parts with assist levels (OT) Description: LTG: Patient will bathe all body parts with assist levels (OT) Outcome: Completed/Met   Problem: RH Dressing Goal: LTG Patient will perform lower body dressing w/assist (OT) Description: LTG: Patient will perform lower body dressing with assist, with/without cues in positioning using equipment (OT) Outcome: Completed/Met   Problem: RH Toileting Goal: LTG Patient will perform toileting task (3/3 steps) with assistance level (OT) Description: LTG: Patient will perform toileting task (3/3 steps) with assistance level (OT)  Outcome: Completed/Met   Problem: RH Toilet Transfers Goal: LTG Patient will perform toilet transfers w/assist (OT) Description: LTG: Patient will perform toilet transfers with assist, with/without cues using equipment (OT) Outcome: Completed/Met   Problem: RH Tub/Shower Transfers Goal: LTG Patient will perform tub/shower transfers w/assist (OT) Description: LTG: Patient will perform tub/shower transfers with assist, with/without cues using equipment (OT) Outcome: Completed/Met   Problem: RH Pre-functional/Other (Specify) Goal: RH LTG OT (Specify) 2 Description: RH LTG OT (Specify) 2  Outcome: Completed/Met

## 2023-10-07 NOTE — Progress Notes (Signed)
 Inpatient Rehabilitation Care Coordinator Discharge Note   Patient Details  Name: Howard Johnson MRN: [id number] Date of Birth: 1956-04-10   Discharge location: D/c to home with wife  Length of Stay: 6 days  Discharge activity level: Supervision  Home/community participation: Limited  Patient response WU:JWJXBJ Literacy - How often do you need to have someone help you when you read instructions, pamphlets, or other written material from your doctor or pharmacy?: Never  Patient response YN:WGNFAO Isolation - How often do you feel lonely or isolated from those around you?: Patient unable to respond  Services provided included: MD, PT, OT, SLP, CM, TR, Pharmacy, Neuropsych, SW, RN, RD  Financial Services:  Field seismologist Utilized: Private Insurance [facility] [employer]  Choices offered to/list presented to: patient wife  Follow-up services arranged:  Outpatient    Outpatient Servicies: [facility]/OT/SLP      Patient response to transportation need: Is the patient able to respond to transportation needs?: Yes In the past 12 months, has lack of transportation kept you from medical appointments or from getting medications?: No In the past 12 months, has lack of transportation kept you from meetings, work, or from getting things needed for daily living?: No   Patient/Family verbalized understanding of follow-up arrangements:  Yes  Individual responsible for coordination of the follow-up plan: contact pt wife Howard Johnson [phone]  Confirmed correct DME delivered: Gretchen Short 10/07/2023    Comments (or additional information):fam edu completed  Summary of Stay    Date/Time Discharge Planning CSW  10/03/23 1518 Pt will d/c to home with his wife who will provider 24/7 care. PRN support from their son that lives close to their home. Outpatient PT/OT/SLP at [facility]. Fam edu on Monday (2/24) with pt wife and son. SW will confim there are no  barriers to discharge. AAC       Wynema Garoutte A Lula Olszewski

## 2023-10-07 NOTE — Progress Notes (Signed)
 Physical Therapy Discharge Summary  Patient Details  Name: Howard Johnson MRN: [id number] Date of Birth: 06-18-1956  Date of Discharge from PT service:October 07, 2023  Today's Date: 10/07/2023 PT Individual Time: 7253-6644 PT Individual Time Calculation (min): 64 min    Patient has met 8 of 8 long term goals due to improved activity tolerance, improved balance, improved postural control, increased strength, improved attention, improved awareness, and improved coordination.  Patient to discharge at an ambulatory level Modified Independent.   Patient's care partner is independent to provide the necessary cognitive assistance at discharge.  Reasons goals not met: N/A  Recommendation:  Patient will benefit from ongoing skilled PT services in outpatient setting to continue to advance safe functional mobility, address ongoing impairments in coordination, dual tasking, sequencing and motor planning, and minimize fall risk.  Equipment: No equipment provided  Reasons for discharge: treatment goals met and discharge from hospital  Patient/family agrees with progress made and goals achieved: Yes  PT Discharge Skilled Treatment Interventions: Pt presents in room seated in recliner with pt wife and son present for family training. Session focused on family training for CLOF for mobility, education on safety precautions, and prescription of HEP to complete at DC for continued NMR for BUE/BLE coordination, visual tracking, dual tasking. Pt ambulates >300' modI during session, completes transfers with modI including car, and ambulates up ramp, over mulch and down curb with supervision.  Pt then completes NMR for BUE/BLE coordination with education provided to pt and pt family on higher repetitions for increasing neuroplasticity, with pt completing one set of the following exercises:  Access Code: [email or web address] URL: [email or web address] Date: 10/07/2023 Prepared by: Edwin Cap  Exercises - Standing Ball Toss with Visual Tracking (toss/catch with 2 hands and toss hand to hand) - 1 x daily - 7 x weekly - 3 sets - 10 reps - [employer] Toss with Eye Tracking While Walking (toss/catch with 2 hands and toss hand to hand)  - 1 x daily - 7 x weekly - 3 sets - 10 reps - [employer] with Eye Tracking Backward Walking  - 1 x daily - 7 x weekly - 3 sets - 10 reps(toss/catch with 2 hands and toss hand to hand) - Single Leg Balance Raising Opposite Arm and Leg  (completing unilaterally x10 reps and then completing on other side) - 1 x daily - 7 x weekly - 3 sets - 10 reps  Pt and pt family also provided with progressions of each exercise and indications for progression, as well as provided with education on completing ambulation outside with pt son which was an activity completed at baseline with pt family to provide cues for RUE reciprocal arm swing.  Pt and pt family provide verbal cues with demonstration throughout session with good carryover to pt.   Pt returns to room and remains seated in recliner with all needs within reach, cal light in place and chair alarm donned and activated at end of session.   Precautions/Restrictions Precautions Precautions: Fall Precaution/Restrictions Comments: expressive and receptive aphasia Restrictions Weight Bearing Restrictions Per Provider Order: No Pain Interference Pain Interference Pain Effect on Sleep: 8. Unable to answer (due to global aphasia) Pain Interference with Therapy Activities: 8. Unable to answer Pain Interference with Day-to-Day Activities: 8. Unable to answer Vision/Perception  Vision - History Ability to See in Adequate Light: 1 Impaired Perception Perception: Within Functional Limits Praxis Praxis: Impaired Praxis Impairment Details: Motor planning  Cognition Overall Cognitive Status: Difficult to assess Arousal/Alertness: Awake/alert Attention: Focused;Sustained  Focused Attention: Appears intact Sustained  Attention: Appears intact Sustained Attention Impairment: Verbal basic;Functional basic Safety/Judgment: Impaired Comments: receptive and expressive aphasia, difficulty expressing Sensation Sensation Light Touch: Appears Intact Hot/Cold: Appears Intact Proprioception: Appears Intact Coordination Gross Motor Movements are Fluid and Coordinated: Yes Fine Motor Movements are Fluid and Coordinated: Yes Finger Nose Finger Test: Unable to motor plan 9 Hole Peg Test: 35 seconds R and 32 seconds L Motor  Motor Motor: Within Functional Limits  Mobility Bed Mobility Bed Mobility: Supine to Sit;Sit to Supine Supine to Sit: Independent Sit to Supine: Independent Transfers Transfers: Sit to Stand;Stand to Sit;Stand Pivot Transfers Sit to Stand: Independent Stand to Sit: Independent Stand Pivot Transfers: Independent Transfer (Assistive device): None Locomotion  Gait Ambulation: Yes Gait Assistance: Independent Gait Distance (Feet): 300 Feet Assistive device: None Gait Gait: Yes Gait Pattern: Impaired (absent RUE arm swing) Gait velocity: functional Stairs / Additional Locomotion Stairs: Yes Stairs Assistance: Independent Stair Management Technique: No rails Number of Stairs: 12 Height of Stairs: 6 Ramp: Supervision/Verbal cueing Curb: Supervision/Verbal cueing Wheelchair Mobility Wheelchair Mobility: No  Trunk/Postural Assessment  Cervical Assessment Cervical Assessment: Within Functional Limits Thoracic Assessment Thoracic Assessment: Within Functional Limits Lumbar Assessment Lumbar Assessment: Within Functional Limits Postural Control Postural Control: Within Functional Limits  Balance Balance Balance Assessed: Yes Static Sitting Balance Static Sitting - Balance Support: No upper extremity supported;Feet supported Static Sitting - Level of Assistance: 7: Independent Dynamic Sitting Balance Dynamic Sitting - Balance Support: Feet supported;No upper extremity  supported Dynamic Sitting - Level of Assistance: 7: Independent Static Standing Balance Static Standing - Balance Support: No upper extremity supported Static Standing - Level of Assistance: 6: Modified independent (Device/Increase time) Dynamic Standing Balance Dynamic Standing - Balance Support: No upper extremity supported Dynamic Standing - Level of Assistance: 6: Modified independent (Device/Increase time) Extremity Assessment  RUE Assessment RUE Assessment: Within Functional Limits LUE Assessment LUE Assessment: Within Functional Limits RLE Assessment RLE Assessment: Within Functional Limits LLE Assessment LLE Assessment: Within Functional Limits   Edwin Cap PT, DPT 10/07/2023, 5:06 PM

## 2023-10-07 NOTE — Consult Note (Signed)
 Neuropsychological Consultation Comprehensive [facility]patient [facility]   Patient:   Howard Johnson   DOB:   September 19, 1955  MR Number:  [id number]  Location:  [facility] [address] CENTER A [address] [place] [address] Dept: [phone] Loc: [phone]           Date of Service:   10/07/2023  Start Time:   9 AM End Time:   10 AM  Provider/Observer:  Arley Phenix, Psy.D.       Clinical Neuropsychologist       Billing Code/Service: [phone]  Reason for Service:    Howard Johnson is a 68 year old male referred for neuropsychological consultation during his ongoing admission to the comprehensive inpatient rehabilitation unit.  Patient was referred because of ongoing severe expressive language deficits.  The patient has a past medical history including type 2 diabetes, hypertension, chronic kidney disease.  Patient was admitted on 09/26/2023 with confusion and reports of agitation with abnormal speech.  Patient displayed word salad, tachycardia and indications of acute stroke event.  Head CT did identify a small foci of hemorrhage in left temporal lobe.  Brain MRI revealed subarachnoid hemorrhage with concerns of dural venous sinus thrombosis.  CTA was negative for large venous obstruction.  CT venogram was performed and confirmed dural venous sinus thrombosis which was exclusive to nearly occlusive throughout left transverse and sigmoid sinus extending into left upper internal jugular and progression of patchy temporal hematoma.  Patient continued to have significant and profound global aphasia with some early reports of possible hallucinations.  EEG showed moderate to severe diffuse slowing indicating global cerebral dysfunction but Johnson seizures noted.  Follow-up CT showed stable hemorrhage.  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  During today's visit, the patient was extremely verbose but it consisted almost entirely  of word salad with attempts to express himself.  The patient displayed a very positive affect throughout and at times was aware of his difficulty expressing his intended thoughts but other times would go into long streams of talk that was mostly word salad with little grammatical structure and just a string of words that were not coherent but properly enunciated.  There was Johnson dysarthria noted.  Patient was for the most part unaware of what he was saying consistent more with left temporal involvement and likely impaired connection between temporal lobe and frontal lobe.  The patient does appear to be gaining his awareness of his disconnected speech with what he would like to say but auditory processing are one of the variables that are probably contributing to his presentation.  There was Johnson indication of neologism and speech and words were pronounced correctly but little awareness of Howard Johnson of sentence structure or connection between what he was saying and what he was intending to say.  There were also Johnson indications of any auditory or visual hallucinations or delusions.  HPI for the current admission:    HPI:  Howard Johnson is a 68 year old male with history of T2DM, HTN, CKD, recent issues with cough and facial pain felt to be due to sinusitis and treated with Augmentin and steriods. He was admitted on 09/26/23 with confusion and reports of agitation with abnormal speech. He had word salad, was tachycardic, had leucocytosis-WBC 15.9, lactic acidosis-2.9 , SCr-1.68 and hyperglycemic with BS-454. Head CT showed small foci of hemorrhage in left temporal lobe. 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 CTA negative  for LVO.  He was started on IVF, ace d/c and CT venogram performed which confirmed dural venous sinus thrombosis which was occlusive to nearly occlusive throughout left transverse and sigmoid sinus extending into left upper internal jugular and progression of patchy temporal  hematoma to 3.5 cm. He was started on IV heparin per stroke protocol. He is now on eliquis.    Carotid dopplers were negative for ICA stenosis. 2D echo showed EF 65-70% with Johnson wall abnormality and DD not evaluated due to A fib. CT chest/abdomen/pelvis was negative for malignancy but showed intraluminal density within bladder clinical correlation recommended and BPH. UDS negative. Respiratory panel, HIV  and blood cultures negative.  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  EEG showed moderate to severe diffuse slowing indicated of global cerebral dysfunction and Johnson seizures noted. He was transitioned to Eliquis on 02/15. Serial CT head showed stable hemorrhage with mild increase in surrounding edema. Blood sugars improving with input from diabetes coordinator.  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 Therapy has been working with patient who requires supervision to min assist. He was independent PTA and CIR recommended due to functional decline.   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 his dress was Appropriate and he was Well Groomed and his manners  were Appropriate to the situation.  his participation was indicative of Appropriate and Redirectable behaviors.  There were physical disabilities noted.  he displayed an appropriate level of cooperation and motivation.    Interactions:    Active Appropriate and Redirectable  Attention:   abnormal and attention span appeared shorter than expected for age  Memory:   Impossible to accurately examine memory but the patient did appear to be able to remember his jobs and work titles and lots of information but truly assessing this is difficult due to his language deficits. ;   Visuo-spatial:   not examined  Speech (Volume):  normal  Speech:   fluent aphasia; complete disconnect between words spoken and intended expression.  Thought Process:  Tangential  Tangential  Though Content:  WNL; not suicidal and not homicidal  Orientation:   person, place, and situation  Judgment:   Poor  Planning:   Poor  Affect:    Excited  Mood:    Euthymic  Insight:   Shallow  Intelligence:   very high  Psychiatric History:  Johnson prior psychiatric history   Family Med/Psych History:  Family History  Problem Relation Age of Onset   Dementia Mother    Lung cancer Father    Diabetes Sister     Impression/DX:  Howard Johnson is a 68 year old male referred for neuropsychological consultation during his ongoing admission to the comprehensive inpatient rehabilitation unit.  Patient was referred because of ongoing severe expressive language deficits.  The patient has a past medical history including type 2 diabetes, hypertension, chronic kidney disease.  Patient was admitted on 09/26/2023 with confusion and reports of agitation with abnormal speech.  Patient displayed word salad, tachycardia and indications of acute stroke event.  Head CT did identify a small foci of hemorrhage in left temporal lobe.  Brain MRI revealed subarachnoid hemorrhage with concerns of dural venous sinus thrombosis.  CTA was negative for  large venous obstruction.  CT venogram was performed and confirmed dural venous sinus thrombosis which was exclusive to nearly occlusive throughout left transverse and sigmoid sinus extending into left upper internal jugular and progression of patchy temporal hematoma.  Patient continued to have significant and profound global aphasia with some early reports of possible hallucinations.  EEG showed moderate to severe diffuse slowing indicating global cerebral dysfunction but Johnson seizures noted.  Follow-up CT showed stable hemorrhage.  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  During today's visit, the patient was extremely verbose but it consisted almost entirely of word salad with attempts to express himself.  The patient displayed a very positive affect throughout and at times was aware of his difficulty expressing his intended thoughts but other times would go into long streams of talk that was mostly word salad with little grammatical structure and just a string of words that were not coherent but properly enunciated.  There was Johnson dysarthria noted.  Patient was for the most part unaware of what he was saying consistent more with left temporal involvement and likely impaired connection between temporal lobe and frontal lobe.  The patient does appear to be gaining his awareness of his disconnected speech with what he would like to say but auditory processing are one of the variables that are probably contributing to his presentation.  There was Johnson indication of neologism and speech and words were pronounced correctly but little awareness of Howard Johnson of sentence structure or connection between what he was saying and what he was intending to say.  There were also Johnson indications of any auditory or visual hallucinations or delusions.  Disposition/Plan:  1 concern is about the possibility or difficulty related to memory function and potential deficits.  Because of the severe expressive  language deficits that would also be displayed in any attempts at writing full assessment as almost impossible around memory and learning.          Electronically Signed   _______________________ Arley Phenix, Psy.D. Clinical Neuropsychologist

## 2023-10-07 NOTE — Plan of Care (Signed)
  Problem: RH Balance Goal: LTG Patient will maintain dynamic sitting balance (PT) Description: LTG:  Patient will maintain dynamic sitting balance with assistance during mobility activities (PT) Outcome: Completed/Met Goal: LTG Patient will maintain dynamic standing balance (PT) Description: LTG:  Patient will maintain dynamic standing balance with assistance during mobility activities (PT) Outcome: Completed/Met   Problem: Sit to Stand Goal: LTG:  Patient will perform sit to stand with assistance level (PT) Description: LTG:  Patient will perform sit to stand with assistance level (PT) Outcome: Completed/Met   Problem: RH Bed to Chair Transfers Goal: LTG Patient will perform bed/chair transfers w/assist (PT) Description: LTG: Patient will perform bed to chair transfers with assistance (PT). Outcome: Completed/Met   Problem: RH Car Transfers Goal: LTG Patient will perform car transfers with assist (PT) Description: LTG: Patient will perform car transfers with assistance (PT). Outcome: Completed/Met   Problem: RH Ambulation Goal: LTG Patient will ambulate in controlled environment (PT) Description: LTG: Patient will ambulate in a controlled environment, # of feet with assistance (PT). Outcome: Completed/Met Goal: LTG Patient will ambulate in home environment (PT) Description: LTG: Patient will ambulate in home environment, # of feet with assistance (PT). Outcome: Completed/Met   Problem: RH Stairs Goal: LTG Patient will ambulate up and down stairs w/assist (PT) Description: LTG: Patient will ambulate up and down # of stairs with assistance (PT) Outcome: Completed/Met

## 2023-10-08 ENCOUNTER — Other Ambulatory Visit (HOSPITAL_COMMUNITY): Payer: Self-pay

## 2023-10-08 DIAGNOSIS — G08 Intracranial and intraspinal phlebitis and thrombophlebitis: Secondary | ICD-10-CM | POA: Diagnosis not present

## 2023-10-08 LAB — GLUCOSE, CAPILLARY: Glucose-Capillary: 133 mg/dL — ABNORMAL HIGH (ref 70–99)

## 2023-10-08 NOTE — Inpatient Diabetes Management (Signed)
 Inpatient Diabetes Program Recommendations  AACE/ADA: New Consensus Statement on Inpatient Glycemic Control (2015)  Target Ranges:  Prepandial:   less than 140 mg/dL      Peak postprandial:   less than 180 mg/dL (1-2 hours)      Critically ill patients:  140 - 180 mg/dL   Lab Results  Component Value Date   GLUCAP 133 (H) 10/08/2023   HGBA1C 12.4 (H) 09/30/2023    Met with patient, spouse and son at bedside.  Reviewed all DM education including the insulin pen.  Educated on The Plate Method, CHO's, portion control, CBGs at home fasting and mid afternoon, F/U with PCP every 3 months, bring meter to PCP office, long and short term complications of uncontrolled BG, and importance of exercise.  Son was able to correctly return demonstrate the insulin pen.  Received verbal order from MD for placing a Freestyle Libre2 on patient.  Educated patient on use, application, monitoring of blood sugar and when to check a finger stick (if receiver alerts to or if she feels different from receiver reading).  Placed sensor on back of left arm.  Discussed alarms and sharing with other family members.  Continue to monitor cbg's with finger sticks while inpatient.  The [employer] # app was downloaded on son's phone as patient's phone is not compatible.  His son has ordered him a new phone which will be compatible.  Spouse will make a PCP appointment for in 1 week.  She should call his PCP if his glucose trends are consistently  <100 mg/dL or >213 mg/dL.  Reviewed hypoglycemia, < 70 mg/dL, signs, symptoms and treatments.    Will continue to follow while inpatient.  Thank you, Dulce Sellar, MSN, [facility] Diabetes Coordinator Inpatient Diabetes Program [phone] (team pager from 8a-5p)

## 2023-10-08 NOTE — Progress Notes (Signed)
 Met with patient and family to review current situation, medications , dietary recommendations, of secondary risks including DM, A1c 12.4, HTN/HLD. Discussed Eliquis. Consult placed for diabetic coordinator. My chart created. Continue to follow along to  provide educational need to facilitate preparation for discharge.

## 2023-10-08 NOTE — Discharge Summary (Signed)
 Physician Discharge Summary  Patient ID: Howard Johnson MRN: [id number] DOB/AGE: 10/24/1955 68 y.o.  Admit date: 10/01/2023 Discharge date: 10/08/2023  Discharge Diagnoses:  Principal Problem:   Cerebral venous sinus thrombosis Active Problems:   ICH (intracerebral hemorrhage) (HCC)   Essential hypertension   DM (diabetes mellitus), type 2 (HCC)   Fluent aphasia   Chronic kidney disease   Discharged Condition: stable  Significant Diagnostic Studies: VAS US CAROTID Result Date: 10/02/2023 Carotid Arterial Duplex Study Patient Name:  Howard Johnson  Date of Exam:   09/29/2023 Medical Rec #: [id number]    Accession #:    [id number] Date of Birth: [birth year]   Patient Gender: M Patient Age:   68 years Exam Location:  [facility] Procedure:      VAS US CAROTID Referring Phys: Marvel Plan --------------------------------------------------------------------------------  Indications:       Speech disturbance. Risk Factors:      Hypertension, hyperlipidemia, Diabetes. Other Factors:     Dural venous sinus thrombosis as well as a subarachnoid                    hemorrhage in the left temporal lobe. Comparison Study:  No prior study on file Performing Technologist: Sherren Kerns RVS  Examination Guidelines: A complete evaluation includes B-mode imaging, spectral Doppler, color Doppler, and power Doppler as needed of all accessible portions of each vessel. Bilateral testing is considered an integral part of a complete examination. Limited examinations for reoccurring indications may be performed as noted.  Right Carotid Findings: +----------+--------+--------+--------+------------------+------------------+           PSV cm/sEDV cm/sStenosisPlaque DescriptionComments           +----------+--------+--------+--------+------------------+------------------+ CCA Prox  100     15                                                    +----------+--------+--------+--------+------------------+------------------+ CCA Distal119     14                                intimal thickening +----------+--------+--------+--------+------------------+------------------+ ICA Prox  105     16              focal and calcificShadowing          +----------+--------+--------+--------+------------------+------------------+ ICA Mid   95      24                                                   +----------+--------+--------+--------+------------------+------------------+ ICA Distal94      23                                                   +----------+--------+--------+--------+------------------+------------------+ ECA       244     11                                                   +----------+--------+--------+--------+------------------+------------------+ +----------+--------+-------+--------+-------------------+  PSV cm/sEDV cmsDescribeArm Pressure (mmHG) +----------+--------+-------+--------+-------------------+ Subclavian                                           +----------+--------+-------+--------+-------------------+ +---------+--------+--+--------+--+ VertebralPSV cm/s70EDV cm/s14 +---------+--------+--+--------+--+  Left Carotid Findings: +----------+--------+--------+--------+------------------+------------------+           PSV cm/sEDV cm/sStenosisPlaque DescriptionComments           +----------+--------+--------+--------+------------------+------------------+ CCA Prox  128     19                                                   +----------+--------+--------+--------+------------------+------------------+ CCA Distal116     20                                intimal thickening +----------+--------+--------+--------+------------------+------------------+ ICA Prox  97      21              calcific                              +----------+--------+--------+--------+------------------+------------------+ ICA Mid   120     36                                                   +----------+--------+--------+--------+------------------+------------------+ ICA Distal109     32                                                   +----------+--------+--------+--------+------------------+------------------+ ECA       224     23                                                   +----------+--------+--------+--------+------------------+------------------+ +----------+--------+--------+--------+-------------------+           PSV cm/sEDV cm/sDescribeArm Pressure (mmHG) +----------+--------+--------+--------+-------------------+ Subclavian130                                         +----------+--------+--------+--------+-------------------+ +---------+--------+--+--------+-+ VertebralPSV cm/s42EDV cm/s7 +---------+--------+--+--------+-+   Summary: Right Carotid: Velocities in the right ICA are consistent with a 1-39% stenosis. Left Carotid: Velocities in the left ICA are consistent with a 1-39% stenosis. Vertebrals:  Bilateral vertebral arteries demonstrate antegrade flow. Subclavians: Right subclavian artery was not visualized. Normal flow              hemodynamics were seen in the left subclavian artery. *See table(s) above for measurements and observations.  Electronically signed by Howard Heady MD on 10/02/2023 at 7:54:28 AM.    Final      Labs:  Basic Metabolic Panel: Recent Labs  Lab 10/02/23 1610 10/03/23 0535 10/04/23 9604 10/07/23 5409  NA 135 139 138 136  K 3.3* 3.4* 3.9 3.9  CL 105 105 106 103  CO2 22 24 23 26   GLUCOSE 156* 113* 111* 108*  BUN 27* 26* 26* 20  CREATININE 1.72* 1.54* 1.61* 1.74*  CALCIUM 8.9 9.3 8.9 9.1    CBC: Recent Labs  Lab 10/02/23 0512 10/03/23 0535 10/07/23 0557  WBC 11.1* 11.5* 9.8  NEUTROABS 9.2*  --   --   HGB 15.0 14.9 14.5  HCT 45.2 44.7 43.6  MCV  82.6 82.3 83.0  PLT 277 306 308    CBG: Recent Labs  Lab 10/07/23 0603 10/07/23 1215 10/07/23 1703 10/07/23 2118 10/08/23 0635  GLUCAP 110* 135* 130* 199* 133*    Brief HPI:   Howard Johnson is a 68 y.o. male with history of T2DM, HTN, CKD, recent issues with facial pain and cough due to sinusitis treated with Augmentin and steroids on outpatient basis.  He was admitted on 09/25/2013 with confusion, agitation and abnormal speech.  MRI brain done revealing SAH with concerns of dural sinus venous thrombosis.  CTA at head was negative for LVO.  He was started on IV fluids and CT venogram performed which confirmed dural venous sinus thrombosis which was occlusive to nearly occlusive throughout left transverse and sigmoid sinus extending into left upper internal jugular and progression of pathy temporal hematoma to 3.5 cm. He was started on IV heparin and transitioned to eliquis. 2 D echo showed EF 65-70% with no wall abnormality and DD not evaluated due to  A fib.  Serial CT head showed mild increase in edema. He continued to have apraxia with need for multimodal cues to follow commands as well as verbal out put limited to jargon. He was requiring supervision to min assist and CIR was recommended due to functional decline.   Hospital Course: Howard Johnson was admitted to rehab 10/01/2023 for inpatient therapies to consist of PT, ST and OT at least three hours five days a week. Past admission physiatrist, therapy team and rehab RN have worked together to provide customized collaborative inpatient rehab.  He was maintained on Eliquis throughout his stay and is tolerating this without any side effects.  He has blood pressures were monitored on TID basis and has been stable.  His diabetes has been monitored with ac/hs CBG checks and SSI was use prn for tighter BS control. Insulin glargine was titrated upwards for tighter control. Follow up check of CMET showed renal status to be at baseline. CBC showed H/H to be  stable. He has made good gains during his stay but continues to be impacted by severe expressive/receptive aphasia. He will continue to receive outpatient PT/ST/OT at [facility] outpatient rehab after discharge.    Rehab course: During patient's stay in rehab team conference was held to monitor patient's progress, set goals and discuss barriers to discharge. At admission, patient required supervision with ADL tasks and contact-guard assist with mobility. He presented with profound expressive and receptive deficits with suspicion of apraxia of speech and answer yes to majority of responses with many fluent he has nonsensical sentence throughout naming task. He  has had improvement in activity tolerance, balance, postural control as well as ability to compensate for deficits.  He is able to complete ADL tasks at modified independent level.  He is modified independent for transfers and to ambulate 300 feet without assistive device. He has had improvement in automatic responses, repetition, auditory comprehension of very functional directions, written of expression at word level and  reading comprehension of functional words.  Family education has been completed.    Discharge disposition: 01-Home or Self Care  Diet: Diabetic/heart healthy  Special Instructions: No driving or strenuous activity till cleared by MD.  2.  Use dexcom to monitor BS.     Discharge Instructions     AMB Referral [facility]BCI Care Management   Complete by: As directed    A1C 12.4%, New to Dexcom-placing at DC. On insulin prior to admission, [facility]   Expected date of contact: Emergent - 3 Days   Service: Pharmacy   Pharmacy Service For:  Disease Management Medication Adherence     Disease states to manage: Diabetes   Ambulatory referral to Neurology   Complete by: As directed    An appointment is requested in approximately: 6 weeks. Patient aphasic--need to contact wife for questions/appts   Ambulatory referral to Physical Medicine Rehab    Complete by: As directed    Patient has aphasia--contact wife or son with details of appt      Allergies as of 10/08/2023       Reactions   Tape Rash        Medication List     STOP taking these medications    amLODipine 5 MG tablet Commonly known as: NORVASC   Farxiga 10 MG Tabs tablet Generic drug: dapagliflozin propanediol   insulin aspart 100 UNIT/ML injection Commonly known as: novoLOG   insulin glargine-yfgn 100 UNIT/ML injection Commonly known as: SEMGLEE   lisinopril 20 MG tablet Commonly known as: ZESTRIL   ondansetron 4 MG/2ML Soln injection Commonly known as: ZOFRAN   polyethylene glycol 17 g packet Commonly known as: MIRALAX / GLYCOLAX   QUEtiapine 25 MG tablet Commonly known as: SEROQUEL   Rybelsus 7 MG Tabs Generic drug: Semaglutide       TAKE these medications    acetaminophen 325 MG tablet Commonly known as: TYLENOL Take 2 tablets (650 mg total) by mouth 2 (two) times daily. What changed:  when to take this reasons to take this   Eliquis 5 MG Tabs tablet Generic drug: apixaban Take 1 tablet (5 mg total) by mouth 2 (two) times daily.   fluticasone 50 MCG/ACT nasal spray Commonly known as: FLONASE Place 2 sprays into both nostrils daily.   Insupen Pen Needles 32G X 4 MM Misc Generic drug: Insulin Pen Needle Use as directed with insulin Notes to patient: Use with insulin pen   Lantus SoloStar 100 UNIT/ML Solostar Pen Generic drug: insulin glargine Inject 20 Units into the skin daily.   loratadine 10 MG tablet Commonly known as: CLARITIN Take 1 tablet (10 mg total) by mouth daily.   rosuvastatin 10 MG tablet Commonly known as: CRESTOR Take 1 tablet (10 mg total) by mouth daily with supper. What changed: when to take this   senna-docusate 8.6-50 MG tablet Commonly known as: Senokot-S Take 2 tablets by mouth daily with breakfast. What changed:  how much to take when to take this        Follow-up Information      Benita Stabile, MD Follow up.   Specialty: Internal Medicine Why: Call in 1-2 days for post hospital follow up Contact information: [address] [place] [address] [phone]         Elijah Birk C, DO Follow up.   Specialty: Physical Medicine and Rehabilitation Why: office will call you with follow up appointment Contact information: [address] [place] [address] [phone]  [facility] Follow up.   Why: office will call you with follow up appointment Contact information: [address]     [address] [place] [address] [phone]                Signed: Jacquelynn Cree 10/10/2023, 8:50 PM

## 2023-10-08 NOTE — Progress Notes (Signed)
 PROGRESS NOTE   Subjective/Complaints: No events overnight. Vitals stable, intakes appear to be down to 600 ccs yesterday No concerns regarding discharge, family at bedside.  ROS: Limited by language deficits  Objective:   No results found. Recent Labs    10/07/23 0557  WBC 9.8  HGB 14.5  HCT 43.6  PLT 308    Recent Labs    10/07/23 0557  NA 136  K 3.9  CL 103  CO2 26  GLUCOSE 108*  BUN 20  CREATININE 1.74*  CALCIUM 9.1    Intake/Output Summary (Last 24 hours) at 10/08/2023 0901 Last data filed at 10/08/2023 0900 Gross per 24 hour  Intake 598 ml  Output --  Net 598 ml        Physical Exam: Vital Signs Blood pressure 127/80, pulse 71, temperature 98 F (36.7 C), resp. rate 16, SpO2 95%.   General: No apparent distress, laying in bed. HEENT: Head is normocephalic, atraumatic, sclera anicteric, oral mucosa pink and moist Neck: Supple without JVD or lymphadenopathy Heart: Reg rate and rhythm. No murmurs rubs or gallops Chest: CTA bilaterally without wheezes, rales, or rhonchi; no distress Abdomen: Soft, non-tender, non-distended, bowel sounds positive. Extremities: No clubbing, cyanosis, or edema. Pulses are 2+ Psych: Pt's affect is appropriate. Pt is cooperative Skin: Red rash around penis--ongoing over penis and groin, appears fungal.  No excoriation or skin breakdown.  Neuro:   Oriented to self and hospital with options. Remains globally aphasic, name 1 out of 3 objects.  Follows 2 of 3 simple commands with visual cues. CRANIAL NERVES: 2 through 12 grossly intact.   MOTOR:   5/5 in b/l UE and LE   SENSORY: Normal to pain all 4 extremities  Reflexes: Negative bilateral Hoffmann's and Babinski's.  No clonus.  Assessment/Plan: 1. Functional deficits which require 3+ hours per day of interdisciplinary therapy in a comprehensive inpatient rehab setting. Physiatrist is providing close team  supervision and 24 hour management of active medical problems listed below. Physiatrist and rehab team continue to assess barriers to discharge/monitor patient progress toward functional and medical goals  Care Tool:  Bathing    Body parts bathed by patient: Right arm, Left arm, Chest, Abdomen, Front perineal area, Buttocks, Right upper leg, Left upper leg, Left lower leg, Face, Right lower leg         Bathing assist Assist Level: Independent with assistive device     Upper Body Dressing/Undressing Upper body dressing   What is the patient wearing?: Pull over shirt    Upper body assist Assist Level: Independent    Lower Body Dressing/Undressing Lower body dressing      What is the patient wearing?: Underwear/pull up, Pants     Lower body assist Assist for lower body dressing: Independent     Toileting Toileting    Toileting assist Assist for toileting: Independent     Transfers Chair/bed transfer  Transfers assist     Chair/bed transfer assist level: Independent     Locomotion Ambulation   Ambulation assist      Assist level: Supervision/Verbal cueing Assistive device: No Device Max distance: 600+   Walk 10 feet activity   Assist  Assist level: Supervision/Verbal cueing Assistive device: No Device   Walk 50 feet activity   Assist    Assist level: Supervision/Verbal cueing Assistive device: No Device    Walk 150 feet activity   Assist    Assist level: Supervision/Verbal cueing Assistive device: No Device    Walk 10 feet on uneven surface  activity   Assist     Assist level: Contact Guard/Touching assist     Wheelchair     Assist Is the patient using a wheelchair?: No             Wheelchair 50 feet with 2 turns activity    Assist            Wheelchair 150 feet activity     Assist          Blood pressure 127/80, pulse 71, temperature 98 F (36.7 C), resp. rate 16, SpO2 95%.   Medical  Problem List and Plan: 1. Functional deficits secondary to Left temporal lobe IPH with CVST             -patient may shower             -ELOS/Goals: 5-7 days Mod I, Pt, Sup to min A OT, min A to mod A with SLP             -Continue CIR, expected discharge 10/08/2023  The patient is medically ready for discharge to home and will need follow-up with [facility] PM[facility]R. In addition, they will need to follow up with their PCP and Neurology.   2.  Antithrombotics: -DVT/anticoagulation:  Pharmaceutical: Eliquis             -antiplatelet therapy: N/A 3. Pain Management: Tylenol prn for pain 4. Mood/Behavior/Sleep: LCSW to follow for evaluation and support.              -antipsychotic agents: Seroquel prn for agitation.  5. Neuropsych/cognition: This patient is not capable of making decisions on his own behalf.  -Dr. Kieth Brightly evaluated 2-24, appreciate his insight and recommendations  6. Skin/Wound Care: Routine pressure relief measures.  7. Fluids/Electrolytes/Nutrition: Monitor I/O. Check CMET in am   - Labs stable. 8. HTN: Monitor BP TID. BP improving. Continue to hold Norvasc.              - BP controlled, continue current regimen    10/08/2023    5:14 AM 10/07/2023    7:51 PM 10/07/2023    3:29 PM  Vitals with BMI  Systolic 127 155 425  Diastolic 80 91 60  Pulse 71 87 97    9. T2DM: Hgb A1c-12.4. Add CM restrictions to diet. Was on Farxiga and Rybelsus PTA.  --Monitor BS ac/hs and use SSI for elevated BS.             --Continue Insulin glargine with 3 units TID AC for meal coverage. Was on prednisone for sinus infection. -2/19 increase glargine from 18u to 20u   2/23 CBGs improving, will discontinue mealtime insulin, addition to help simplify  Regimen  2-24: Blood sugars well-controlled.  CBG (last 3)  Recent Labs    10/07/23 1703 10/07/23 2118 10/08/23 0635  GLUCAP 130* 199* 133*    10. Leucocytosis Monitor for fevers and other signs of infection. WBC down to 11 range.  --sinus  infection was treated with Augmentin thru 09/29/23.  -2/20 WBC 11.5, elevated but overall stable from last few days.  Monitor for signs of infection -Resolved 9.8, 2-24  11. CKD  IIIa: BUN elevated--encourage fluid intake. SCr trending down from 1.68-->1.26.              -2/20 creatinine down from 1.72 to 1.54, continue encourage oral intake  -2/21 creatinine 1.61, BUN 26 continue to encourage oral fluids  -Continue to encourage fluids- will ask nursing to encourage too  -2/24: BUN down to 20, Cr remains 1.74;  Intakes 720 ccs yesterday; continue to encourage PO,  -2-25: Discussed with patient and family pushing p.o. fluids at home.  Can follow-up as outpatient  12. Constipation: Finally had BM last night and another after lunch. --Continue Senna 2 daily in am.   -2/23 LBM today  13. Headaches/Facial pain?:  Rhinocort and Claritin added for facial pain/congestion. Will schedule tylenol bid.    - No indication of pain to-24  14. Hypokalemia              -2/19 today potassium  2/20  K+ 3.4, potassium x2 today  2/21 potassium up to 3.9  2/24: stable 3.9  15.  Penile rash  -- Continue Nystatin cream, appears to be improving--continue regimen at home.  LOS: 7 days A FACE TO FACE EVALUATION WAS PERFORMED  Howard Johnson 10/08/2023, 9:01 AM

## 2023-10-10 ENCOUNTER — Telehealth: Payer: Self-pay

## 2023-10-10 DIAGNOSIS — N189 Chronic kidney disease, unspecified: Secondary | ICD-10-CM | POA: Insufficient documentation

## 2023-10-10 NOTE — Telephone Encounter (Signed)
 Transitional Care Call--who you spoke with [name] (Wife) [phone].   Are you/is patient experiencing any problems since coming home? None Are there any questions regarding any aspect of care?No.  Are there any questions regarding medications administration/dosing? Yes. Are meds being taken as prescribed? Yes.  Patient should review meds with caller to confirm. Medications reviewed.  Have there been any falls? None Has Home Health been to the house and/or have they contacted you? Outpatient appointment to be scheduled by Mr. Hoctor today.  If not, have you tried to contact them? No.  Can we help you contact them?  No need.  Are bowels and bladder emptying properly? Yes.  Are there any unexpected incontinence issues? No.   If applicable, is patient following bowel/bladder programs? N/A Any fevers, problems with breathing, unexpected pain? No. Are there any skin problems or new areas of breakdown? No problem. Has the patient/family member arranged specialty MD follow up (ie cardiology/neurology/renal/surgical/etc)? Yes. Can we help arrange? No need. Does the patient need any other services or support that we can help arrange? No. Are caregivers following through as expected in assisting the patient? Yes.        11. Has the patient quit smoking, drinking alcohol, or using drugs as recommended? Advised.    Appointment  [address] with Dr. Shearon Stalls on 10/16/2023 arrive at 2:40 pm.

## 2023-10-15 NOTE — Progress Notes (Signed)
 Subjective:    Patient ID: Howard Johnson, male    DOB: 1956/05/10, 68 y.o.   MRN: [id number]  HPI  Howard Johnson is a 68 y.o. year old male  who  has a past medical history of Achilles tendinitis, Diabetes mellitus without complication (HCC), Elevated PSA, Hyperlipidemia, Hypertension, Right knee pain, and Shoulder pain, bilateral.   They are presenting to PM&R clinic for follow up related to IPR stay follow venous sinus thrombosis .   Interval Hx:  - Therapies: Starts speech and OT tomorrow morning; he is independent of ADLs for the most part. They are walking up to a mile the last 2 days. Son is doing weighted ball exercises and alternating balance activities.   Aphasia remains persistent but improving, "but it seems like when he is walking he has more moments of clarity. He had a perfectly normal conversation during a walk with a neighbor the other day." Son is working on labelling basketball teams. Seems to perseverate on "school".    - Follow ups: Neurology follow up in may.    - Falls: None; no near misses   - DME: none   - Medications: BG is "in the green zone 90% of the time." Otherwise, no issues. No pain so has not been taking tylenol. No longer on stool softener.    - Other concerns:   Pain Inventory Average Pain 0 Pain Right Now 0 My pain is  no pain  BOWEL Number of stools per week: 3-4  BLADDER Normal  Mobility walk without assistance how many minutes can you walk? 10-35 ability to climb steps?  yes do you drive?  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  no  Physicians involved in your care Any changes since last visit?  no   Family History  Problem Relation Age of Onset   Dementia Mother    Lung cancer Father    Diabetes Sister    Social History   Socioeconomic History   Marital status: Married    Spouse name: Not on  file   Number of children: Not on file   Years of education: Not on file   Highest education level: Not on file  Occupational History   Not on file  Tobacco Use   Smoking status: Never   Smokeless tobacco: Never  Vaping Use   Vaping status: Never Used  Substance and Sexual Activity   Alcohol use: Never   Drug use: Never   Sexual activity: Not on file  Other Topics Concern   Not on file  Social History Narrative   Not on file   Social Drivers of Health   Financial Resource Strain: Not on file  Food Insecurity: Not on file  Transportation Needs: Not on file  Physical Activity: Not on file  Stress: Not on file  Social Connections: Not on file   Past Surgical History:  Procedure Laterality Date   TONSILLECTOMY     Past Medical History:  Diagnosis Date   Achilles tendinitis    Left   Diabetes mellitus without complication (HCC)    Elevated PSA    Hyperlipidemia    Hypertension    Right knee pain    Shoulder pain, bilateral    BP (!) 145/94   Pulse 94   Ht 6\' 4"  (1.93 m)   Wt 208 lb 9.6 oz (94.6 kg)  SpO2 96%   BMI 25.39 kg/m   Opioid Risk Score:   Fall Risk Score:  `1  Depression screen [facility] 2/9     10/16/2023    2:38 PM  Depression screen PHQ 2/9  Decreased Interest 0  Down, Depressed, Hopeless 0  PHQ - 2 Score 0  Altered sleeping 0  Tired, decreased energy 0  Change in appetite 0  Feeling bad or failure about yourself  0  Trouble concentrating 0  Moving slowly or fidgety/restless 0  Suicidal thoughts 0  PHQ-9 Score 0      Review of Systems  HENT:         Complains of ears  -hearing  Endocrine:       High blood sugars  Hematological:  Bruises/bleeds easily.       Eliquis  All other systems reviewed and are negative.      Objective:   Physical Exam   PE: Constitution: Appropriate appearance for age. No apparent distress   Resp: No respiratory distress. No accessory muscle usage. on RA and CTAB Cardio: Well perfused appearance. No  peripheral edema. Abdomen: Nondistended. Nontender.   Psych: Appropriate mood and affect.  Neurologic Exam:   Cognition: awake, alert, unable to assess orientation. Expressive>> recentlive aphasia, follows 2/3 oral commands + Difficulty wordfinding, substitutions  DTRs: Reflexes were 2+ in bilateral achilles, patella, biceps, BR and triceps. Babinsky: flexor responses b/l.   Hoffmans: negative b/l CN: Mild L ptosis. ? Bilateral hearing deficit Sensory exam: revealed normal sensation in all dermatomal regions in bilateral upper extremities and bilateral lower extremities Motor exam: strength 5/5 throughout bilateral upper extremities and bilateral lower extremities Coordination: Mild LUE ataxia Gait: Normal          Assessment & Plan:  Howard Johnson is a 68 y.o. year old male  who  has a past medical history of Achilles tendinitis, Diabetes mellitus without complication (HCC), Elevated PSA, Hyperlipidemia, Hypertension, Right knee pain, and Shoulder pain, bilateral.  They are presenting to PM&R clinic for follow up related to IPR stay follow venous sinus thrombosis .  Dural venous sinus thrombosis Fluent aphasia  Patient doing extremely well from recovery standpoint, has follow ups arranged, no interventions warranted at this time.   Follow up in 6 months for speech and cognitive deficits; feel free to cancel if not needed  Continue therapies

## 2023-10-16 ENCOUNTER — Encounter: Payer: Self-pay | Admitting: Physical Medicine and Rehabilitation

## 2023-10-16 ENCOUNTER — Encounter: Payer: HMO | Attending: Physical Medicine and Rehabilitation | Admitting: Physical Medicine and Rehabilitation

## 2023-10-16 VITALS — BP 145/94 | HR 94 | Ht 76.0 in | Wt 208.6 lb

## 2023-10-16 DIAGNOSIS — G08 Intracranial and intraspinal phlebitis and thrombophlebitis: Secondary | ICD-10-CM | POA: Diagnosis not present

## 2023-10-16 DIAGNOSIS — R4701 Aphasia: Secondary | ICD-10-CM

## 2023-10-16 NOTE — Patient Instructions (Signed)
 Follow up in 6 months for speech and cognitive deficits; feel free to cancel if not needed  Continue therapies

## 2023-10-17 ENCOUNTER — Encounter (HOSPITAL_COMMUNITY): Payer: Self-pay | Admitting: Occupational Therapy

## 2023-10-17 ENCOUNTER — Ambulatory Visit (HOSPITAL_COMMUNITY): Payer: HMO | Attending: Physical Medicine and Rehabilitation | Admitting: Speech Pathology

## 2023-10-17 ENCOUNTER — Other Ambulatory Visit: Payer: Self-pay

## 2023-10-17 ENCOUNTER — Ambulatory Visit (HOSPITAL_COMMUNITY): Payer: HMO | Admitting: Occupational Therapy

## 2023-10-17 ENCOUNTER — Encounter (HOSPITAL_COMMUNITY): Payer: Self-pay | Admitting: Speech Pathology

## 2023-10-17 DIAGNOSIS — R4701 Aphasia: Secondary | ICD-10-CM | POA: Insufficient documentation

## 2023-10-17 DIAGNOSIS — I69890 Apraxia following other cerebrovascular disease: Secondary | ICD-10-CM | POA: Insufficient documentation

## 2023-10-17 DIAGNOSIS — I619 Nontraumatic intracerebral hemorrhage, unspecified: Secondary | ICD-10-CM | POA: Diagnosis not present

## 2023-10-17 DIAGNOSIS — G08 Intracranial and intraspinal phlebitis and thrombophlebitis: Secondary | ICD-10-CM

## 2023-10-17 DIAGNOSIS — R41841 Cognitive communication deficit: Secondary | ICD-10-CM | POA: Insufficient documentation

## 2023-10-17 NOTE — Therapy (Signed)
 OUTPATIENT OCCUPATIONAL THERAPY NEURO EVALUATION  Patient Name: Howard Johnson MRN: [id number] DOB:[birth year], 68 y.o., male Today's Date: 10/17/2023  PCP: Nita Sells, MD REFERRING PROVIDER: Delle Reining, PA-C  END OF SESSION:  OT End of Session - 10/17/23 1021     Visit Number 1    Number of Visits 1    Date for OT Re-Evaluation 10/18/23    Authorization Type [employer]    OT Start Time 0854    OT Stop Time 0927    OT Time Calculation (min) 33 min    Activity Tolerance Patient tolerated treatment well    Behavior During Therapy [facility] for tasks assessed/performed             Past Medical History:  Diagnosis Date   Achilles tendinitis    Left   Diabetes mellitus without complication (HCC)    Elevated PSA    Hyperlipidemia    Hypertension    Right knee pain    Shoulder pain, bilateral    Past Surgical History:  Procedure Laterality Date   TONSILLECTOMY     Patient Active Problem List   Diagnosis Date Noted   Chronic kidney disease 10/10/2023   ICH (intracerebral hemorrhage) (HCC) 10/01/2023   Essential hypertension 10/01/2023   Hyperlipidemia 10/01/2023   DM (diabetes mellitus), type 2 (HCC) 10/01/2023   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   Dural venous sinus thrombosis 09/26/2023    ONSET DATE: 09/26/23  REFERRING DIAG: W09 (ICD-10-CM) - Intracranial and intraspinal phlebitis and thrombophlebitis   THERAPY DIAG:  Nontraumatic intracerebral hemorrhage, unspecified cerebral location, unspecified laterality (HCC)  Cerebral venous sinus thrombosis  Rationale for Evaluation and Treatment: Rehabilitation  SUBJECTIVE:   SUBJECTIVE STATEMENT: "Everyday gets better" Pt accompanied by: self  PERTINENT HISTORY:  68 y.o. male with history of T2DM, HTN, CKD. He was admitted on 09/25/2013 with confusion, agitation and abnormal speech.  MRI brain done revealing SAH with concerns of dural sinus venous thrombosis.    PRECAUTIONS: None  WEIGHT BEARING RESTRICTIONS: No  PAIN:  Are you having pain? No  FALLS: Has patient fallen in last 6 months? No  LIVING ENVIRONMENT: Lives with: lives with their family and lives with their son Lives in: House/apartment  PLOF: Independent  PATIENT GOALS: To get back to normal  OBJECTIVE:  Note: Objective measures were completed at Evaluation unless otherwise noted.  HAND DOMINANCE: Right  ADLs: Overall ADLs: No concerns, pt and son report independence with all ADL's and IADL's, except for cooking and driving due to cognition.  ACTIVITY TOLERANCE: Activity tolerance: WFL  FUNCTIONAL OUTCOME MEASURES: Assess at a later time  UPPER EXTREMITY ROM:     Pt demonstrating full ROM in BUE  UPPER EXTREMITY MMT:     Pt is 5/5 in BUE in all motions  HAND FUNCTION: Grip strength: Right: 110 lbs; Left: 90 lbs, Lateral pinch: Right: 19 lbs, Left: 18 lbs, and 3 point pinch: Right: 19 lbs, Left: 15 lbs  COORDINATION: 9 Hole Peg test: Right: 24.22 sec; Left: 24.53 sec  SENSATION: Appears intact, unable to accurately test due to cognition.  EDEMA: None noted  COGNITION: Overall cognitive status: Impaired and Difficulty to assess due to: Communication impairment. Pt expressively aphasic, difficulty with understanding at times and following commands.  VISION: Subjective report: Pt has to wear contacts and reading glasses at baseline Baseline vision: Wears contacts and Wears glasses all the time Visual history:  unsure  VISION ASSESSMENT: To be further assessed in functional  context Unable to accurately test at this time due to cognition and aphasia.   Patient has difficulty with following activities due to following visual impairments: reading, near sited vision, potential field cuts.  OBSERVATIONS: Physically overall pt is [facility].                                                                                                                              TREATMENT DATE:  10/17/23 Evaluation Only         PATIENT EDUCATION: Education details: continue with CIR exercises and walking daily Person educated: Patient Education method: Explanation Education comprehension: verbalized understanding  HOME EXERCISE PROGRAM: Continue CIR exercises and daily walking   ASSESSMENT:  CLINICAL IMPRESSION: Patient is a 68 y.o. male who was seen today for occupational therapy evaluation for s/p SAH and thrombolytic CVA. He presents with deficits mainly linked to cognition and speech. Overall strength and mobility are [facility].   PERFORMANCE DEFICITS: in functional skills including IADLs, cognitive skills including attention, memory, orientation, problem solving, safety awareness, sequencing, and understand, and psychosocial skills including coping strategies, environmental adaptation, and interpersonal interactions.   IMPAIRMENTS: are limiting patient from IADLs and work.   CO-MORBIDITIES: may have co-morbidities  that affects occupational performance. Patient will benefit from skilled OT to address above impairments and improve overall function.  MODIFICATION OR ASSISTANCE TO COMPLETE EVALUATION: Min-Moderate modification of tasks or assist with assess necessary to complete an evaluation.  OT OCCUPATIONAL PROFILE AND HISTORY: Problem focused assessment: Including review of records relating to presenting problem.  CLINICAL DECISION MAKING: LOW - limited treatment options, no task modification necessary  REHAB POTENTIAL: Good  EVALUATION COMPLEXITY: Low    PLAN:  OT FREQUENCY: one time visit  OT DURATION: 1 week  PLANNED INTERVENTIONS: 16109 OT Re-evaluation  RECOMMENDED OTHER SERVICES: Speech and Opthamology  CONSULTED AND AGREED WITH PLAN OF CARE: Patient and family member/caregiver  PLAN FOR NEXT SESSION: DISCHARGE   Trish Mage, OTR/L [facility] [phone] Samie Barclift Rosemarie Beath, OT 10/17/2023, 10:22 AM

## 2023-10-17 NOTE — Therapy (Signed)
 OUTPATIENT SPEECH LANGUAGE PATHOLOGY APHASIA EVALUATION   Patient Name: Howard Johnson MRN: [id number] DOB:November 30, 1955, 68 y.o., male Today's Date: 10/17/2023  PCP: Benita Stabile, MD REFERRING PROVIDER: Jacquelynn Cree, PA-C  END OF SESSION:  End of Session - 10/17/23 1304     Visit Number 1    Number of Visits 17    Date for SLP Re-Evaluation 12/19/23    Authorization Type [employer]   eff: 08/14/23 oop: 3400 copay 5 no auth spoke with erica t ref# [id number]   SLP Start Time 0930    SLP Stop Time  1015    SLP Time Calculation (min) 45 min    Activity Tolerance Patient tolerated treatment well             Past Medical History:  Diagnosis Date   Achilles tendinitis    Left   Diabetes mellitus without complication (HCC)    Elevated PSA    Hyperlipidemia    Hypertension    Right knee pain    Shoulder pain, bilateral    Past Surgical History:  Procedure Laterality Date   TONSILLECTOMY     Patient Active Problem List   Diagnosis Date Noted   Chronic kidney disease 10/10/2023   ICH (intracerebral hemorrhage) (HCC) 10/01/2023   Essential hypertension 10/01/2023   Hyperlipidemia 10/01/2023   DM (diabetes mellitus), type 2 (HCC) 10/01/2023   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   Dural venous sinus thrombosis 09/26/2023    ONSET DATE: 09/26/2023   REFERRING DIAG: I34 (ICD-10-CM) - Intracranial and intraspinal phlebitis and thrombophlebitis  THERAPY DIAG:  Fluent aphasia  Cognitive communication deficit  Apraxia following other cerebrovascular disease  Rationale for Evaluation and Treatment: Rehabilitation  SUBJECTIVE:   SUBJECTIVE STATEMENT: "The houses reading together."  Pt accompanied by: self and son, [name]  PERTINENT HISTORY: Patient was admitted on 09/26/2023 with confusion and reports of agitation with abnormal speech. Patient displayed word salad, tachycardia and indications of acute stroke event. Head CT did  identify a small foci of hemorrhage in left temporal lobe. Brain MRI revealed subarachnoid hemorrhage with concerns of dural venous sinus thrombosis. CTA was negative for large venous obstruction. CT venogram was performed and confirmed dural venous sinus thrombosis which was exclusive to nearly occlusive throughout left transverse and sigmoid sinus extending into left upper internal jugular and progression of patchy temporal hematoma. Patient continued to have significant and profound global aphasia with some early reports of possible hallucinations. EEG showed moderate to severe diffuse slowing indicating global cerebral dysfunction but no seizures noted. Follow-up CT showed stable hemorrhage. Pt was at CIR from 10/02/23-10/08/23 and was discharged home with family.  PAIN:  Are you having pain? No  FALLS: Has patient fallen in last 6 months?  No  LIVING ENVIRONMENT: Lives with: lives with their family Lives in: House/apartment  PLOF:  Level of assistance: Independent with ADLs, Independent with IADLs Employment: Retired  PATIENT GOALS: Improve speech  OBJECTIVE:  Note: Objective measures were completed at Evaluation unless otherwise noted.  DIAGNOSTIC FINDINGS: MR BRAIN Redemonstrated subarachnoid hemorrhage in the left temporal lobe.  COGNITION: Overall cognitive status: Difficulty to assess due to: Communication impairment and aphasia Areas of impairment:  Awareness: Impaired: Emergent Functional deficits: Pt with reduced awareness of expressive language deficits  AUDITORY COMPREHENSION: Overall auditory comprehension: Impaired: simple YES/NO questions: Impaired: simple Following directions: Impaired: simple Conversation: Simple Interfering components: awareness Effective technique: repetition/stressing words, written cues, stressing words, and visual/gestural cues  READING COMPREHENSION:  Impaired: sentence  EXPRESSION: verbal  VERBAL EXPRESSION: Level of  generative/spontaneous verbalization: word Automatic speech: name: intact, social response: intact, counting: intact, and day of week: impaired  Repetition: Impaired: Word Naming: Responsive: 26-50%, Confrontation: 0-25%, and Divergent: 0-25% Pragmatics: Appears intact Comments: fluent aphasia Interfering components: attention Effective technique: sentence completion, phonemic cues, and written cues Non-verbal means of communication: N/A  WRITTEN EXPRESSION: Dominant hand: right Written expression: Impaired: phrase  MOTOR SPEECH: Overall motor speech: impaired Level of impairment: Phrase Respiration:  WNL Phonation: normal Resonance: WFL Articulation: Appears intact Intelligibility: Intelligible Motor planning: Impaired: unaware and inconsistent Motor speech errors: unaware and inconsistent Interfering components:  N/A Effective technique: slow rate  ORAL MOTOR EXAMINATION: Overall status: WFL Comments: N/A  STANDARDIZED ASSESSMENTS: MAST and portions of other assessments  MAST Pt was administered the [place] Aphasia Screening Tool (MAST) and achieved an overall expressive score of 8/50, receptive score of 16/50, and a total score of 24/100.  Expressive Index Naming 0/10 Automatic Speech 2/10 Repetition 4/10 Writing 2/10 Verbal Fluency 0/10 Expressive Subscale 8/50  Receptive Index Yes/No Accuracy 8/20 Object Recognition 8/10 Following Instructions 0/10 Reading Instructions 0/10 Receptive Subscale 16/50  Total Index Expressive 8/50 Receptive 16/50 Total Score 24/100                                                                                                                     TREATMENT DATE: 10/17/23 Evaluation completed this date.  PATIENT EDUCATION: Education details: Plan for expressive and receptive language therapy 2x/week for 1-2 months Person educated: Patient and Child(ren) Education method: Explanation and Handouts Education  comprehension: verbalized understanding   GOALS: Goals reviewed with patient? Yes  SHORT TERM GOALS: Target date: 11/21/2023  Pt will increase naming of common objects/pictures to 90% acc when provided with mod multimodality cues Baseline: 50% max cues Goal status: INITIAL  2.  Pt will complete functional check writing and balancing tasks with 100% acc with min assist and use of compensatory strategies as needed. Baseline: writes single words with 50% acc Goal status: INITIAL  3.  Pt will implement word-finding strategies with 80% accuracy when unable to verbalize desired word in conversation/functional tasks with mi/mod assist. Baseline: 75% mod assist Goal status: INITIAL  4.  Pt will describe objects and pictures by providing at least three salient features as judged by clinician with 90% acc when provided mi/mod cues.  Baseline:  Goal status: INITIAL  5.  Pt will complete reading comprehension tasks (1-2 sentences) with 80% acc and min assist via multiple choice response. Baseline: varies from 20-50% Goal status: INITIAL  6.  Pt will be able to write short grocery lists, phone messages with 80% acc and min cues. Baseline: 60% mod cues Goal status: INITIAL  LONG TERM GOALS: Target date: February 11, 2024  Pt will communicate moderate level wants/needs/thoughts to family and friends with use of multimodality communication strategies as needed.   Baseline: Mod assist Goal status: INITIAL  ASSESSMENT:  CLINICAL IMPRESSION: Patient is a 68  y.o. male who was seen today for a speech/language evaluation s/p recent CVA. He presents with fluent aphasia with some suspicion for apraxia and moderate receptive deficits. Pt has made significant improvement since he was discharged home, however he continues to be unaware of word substitutions in connected speech. He scored 8/50 on the expressive portion of the MAST and 16/50 on the receptive portion, however functionally, he is able to follow  along and participate in short high interest conversations with use of total communication strategies (written cues from SLP to guide conversation, gestures, drawing, and real words). He was unable to name common objects without a phonemic cue and tended to perseverate on numbers at times. He demonstrated relative strengths in auditory comprehension for single words, basic yes/no questions, and object recognition. He was able to repeat single words 75% of the time (he was unable to repeat in the hospital). Pt appears slightly impulsive with some attention difficulties, which impact receptive and expressive skills. He is motivated to improve his ability to communicate and has excellent family support.   OBJECTIVE IMPAIRMENTS: include awareness, executive functioning, expressive language, receptive language, aphasia, and apraxia. These impairments are limiting patient from managing medications, managing appointments, managing finances, and effectively communicating at home and in community. Factors affecting potential to achieve goals and functional outcome are ability to learn/carryover information, severity of impairments, and family/community support. Patient will benefit from skilled SLP services to address above impairments and improve overall function.  REHAB POTENTIAL: Excellent  PLAN:  SLP FREQUENCY: 2x/week  SLP DURATION: 8 weeks  PLANNED INTERVENTIONS: Language facilitation, Cueing hierachy, Internal/external aids, Functional tasks, Multimodal communication approach, SLP instruction and feedback, Compensatory strategies, Patient/family education, and 16109 Treatment of speech (30 or 45 min)    Thank you,  Havery Moros, CCC-SLP [phone]  Dakhari Zuver, CCC-SLP 10/17/2023, 1:07 PM

## 2023-10-22 ENCOUNTER — Other Ambulatory Visit (HOSPITAL_COMMUNITY): Payer: Self-pay | Admitting: Family Medicine

## 2023-10-22 DIAGNOSIS — E782 Mixed hyperlipidemia: Secondary | ICD-10-CM | POA: Diagnosis not present

## 2023-10-22 DIAGNOSIS — I10 Essential (primary) hypertension: Secondary | ICD-10-CM | POA: Diagnosis not present

## 2023-10-22 DIAGNOSIS — R130 Aphagia: Secondary | ICD-10-CM | POA: Diagnosis not present

## 2023-10-22 DIAGNOSIS — Z8673 Personal history of transient ischemic attack (TIA), and cerebral infarction without residual deficits: Secondary | ICD-10-CM | POA: Diagnosis not present

## 2023-10-22 DIAGNOSIS — I609 Nontraumatic subarachnoid hemorrhage, unspecified: Secondary | ICD-10-CM | POA: Diagnosis not present

## 2023-10-22 DIAGNOSIS — J302 Other seasonal allergic rhinitis: Secondary | ICD-10-CM | POA: Diagnosis not present

## 2023-10-22 DIAGNOSIS — R590 Localized enlarged lymph nodes: Secondary | ICD-10-CM | POA: Diagnosis not present

## 2023-10-22 DIAGNOSIS — B356 Tinea cruris: Secondary | ICD-10-CM | POA: Diagnosis not present

## 2023-10-22 DIAGNOSIS — E1165 Type 2 diabetes mellitus with hyperglycemia: Secondary | ICD-10-CM | POA: Diagnosis not present

## 2023-10-23 ENCOUNTER — Ambulatory Visit (HOSPITAL_COMMUNITY): Admitting: Speech Pathology

## 2023-10-23 DIAGNOSIS — R4701 Aphasia: Secondary | ICD-10-CM | POA: Diagnosis not present

## 2023-10-23 DIAGNOSIS — R41841 Cognitive communication deficit: Secondary | ICD-10-CM

## 2023-10-23 DIAGNOSIS — I69890 Apraxia following other cerebrovascular disease: Secondary | ICD-10-CM

## 2023-10-24 DIAGNOSIS — E1165 Type 2 diabetes mellitus with hyperglycemia: Secondary | ICD-10-CM | POA: Diagnosis not present

## 2023-10-28 ENCOUNTER — Telehealth: Payer: Self-pay

## 2023-10-28 NOTE — Progress Notes (Signed)
   10/28/2023  Patient ID: Howard Johnson, male   DOB: [birth year], 68 y.o.   MRN: [id number]     10/28/2023 Name: Howard Johnson MRN: [id number] DOB: August 10, 1956   Howard Johnson is a 68 y.o. year old male who presented for a telephone visit.   They were referred to the pharmacist by their Case Management Team  for assistance in managing diabetes.    Subjective:  Care Team: Primary Care Provider: Benita Stabile, MD ; Next Scheduled Visit: 01/07/24  Medication Access/Adherence  Current Pharmacy:  [facility] - [place], [place] - [address] [place] [address] Phone: [phone] Fax: [phone]  [facility] [address] [place] [address] Phone: [phone] Fax: [phone]   Patient reports affordability concerns with their medications: No  Patient reports access/transportation concerns to their pharmacy: No  Patient reports adherence concerns with their medications:  No     Diabetes:  Current medications:  -Lantus - 15 units daily  Medications tried in the past:  -Farxiga 10 mg - 1 tablet daily -Metformin 500 mg - 1 tablet daily -Rybelsus 7 mg - 1 tablet daily  Stopped oral meds after hospital visit for stroke. He was managing meds prior to this admission but son Howard Johnson has been managing since his discharge. [name] has mild aphasia but son says this is improving. PCP simplified regimen to Lantus 15 units daily only after follow up visit on 10/22/23. Monitoring BG using Freestyle Libre 3 Plus the patient receives from [employer] [facility]. Son has been monitoring very closely and patient has started to engage in this as well. Did not obtain any specific CGM data today but he has had a few readings below 70 that required a snack. Son usually verifies with finger stick if he questions CGM readings. Son is also controlling diet and prompting exercise as tolerated. Will get more specific CGM data next visit.  Objective:  Lab  Results  Component Value Date   HGBA1C 12.4 (H) 09/30/2023    Lab Results  Component Value Date   CREATININE 1.74 (H) 10/07/2023   BUN 20 10/07/2023   NA 136 10/07/2023   K 3.9 10/07/2023   CL 103 10/07/2023   CO2 26 10/07/2023    Lab Results  Component Value Date   CHOL 112 09/27/2023   HDL 36 (L) 09/27/2023   LDLCALC 54 09/27/2023   TRIG 110 09/27/2023   CHOLHDL 3.1 09/27/2023        Assessment/Plan:   Diabetes:  Currently uncontrolled but improving per Howard Johnson. Will get updated CGM data next week and follow up on Lantus fills. They have additional refills at the pharmacy and will likely be getting refill before next visit. Howard Johnson seems to be pretty educated on diabetes but will assess his knowledge at next follow up and fill in any gaps as needed. Will also try to set up LibreView so we can monitor alongside Howard Johnson and make follow up visits more productive.   Scheduled follow up for 11/11/23 at 11:00 am.   [name], PharmD

## 2023-10-29 ENCOUNTER — Ambulatory Visit (HOSPITAL_COMMUNITY): Admitting: Speech Pathology

## 2023-10-29 DIAGNOSIS — R4701 Aphasia: Secondary | ICD-10-CM

## 2023-10-29 DIAGNOSIS — I69890 Apraxia following other cerebrovascular disease: Secondary | ICD-10-CM

## 2023-10-29 DIAGNOSIS — R41841 Cognitive communication deficit: Secondary | ICD-10-CM

## 2023-10-29 NOTE — Therapy (Signed)
 OUTPATIENT SPEECH LANGUAGE PATHOLOGY APHASIA TREATMENT   Patient Name: Howard Johnson MRN: [id number] DOB:02-02-1956, 68 y.o., male Today's Date: 10/29/2023  PCP: Benita Stabile, MD REFERRING PROVIDER: Jacquelynn Cree, PA-C  END OF SESSION:  End of Session - 10/29/23 1928     Visit Number 3    Number of Visits 17    Date for SLP Re-Evaluation 12/19/23    Authorization Type [employer]   eff: 08/14/23 oop: 3400 copay 5 no auth spoke with erica t ref# [id number]   SLP Start Time 1515    SLP Stop Time  1600    SLP Time Calculation (min) 45 min    Activity Tolerance Patient tolerated treatment well             Past Medical History:  Diagnosis Date   Achilles tendinitis    Left   Diabetes mellitus without complication (HCC)    Elevated PSA    Hyperlipidemia    Hypertension    Right knee pain    Shoulder pain, bilateral    Past Surgical History:  Procedure Laterality Date   TONSILLECTOMY     Patient Active Problem List   Diagnosis Date Noted   Chronic kidney disease 10/10/2023   ICH (intracerebral hemorrhage) (HCC) 10/01/2023   Essential hypertension 10/01/2023   Hyperlipidemia 10/01/2023   DM (diabetes mellitus), type 2 (HCC) 10/01/2023   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 Patient displayed word salad, tachycardia and indications of acute stroke  event. Head CT did identify a small foci of hemorrhage in left temporal lobe. Brain MRI revealed subarachnoid hemorrhage with concerns of dural venous sinus thrombosis. CTA was negative for large venous obstruction. CT venogram was performed and confirmed dural venous sinus thrombosis which was exclusive to nearly occlusive throughout left transverse and sigmoid sinus extending into left upper internal jugular and progression of patchy temporal hematoma. Patient continued to have significant and profound global aphasia with some early reports of possible hallucinations. EEG showed moderate to severe diffuse slowing indicating global cerebral dysfunction but no seizures noted. Follow-up CT showed stable hemorrhage. Pt was at [facility] from 10/02/23-10/08/23 and was discharged home with family.  PAIN:  Are you having pain? No  FALLS: Has patient fallen in last 6 months?  No  LIVING ENVIRONMENT: Lives with: lives with their family Lives in: House/apartment  PLOF:  Level of assistance: Independent with ADLs, Independent with IADLs Employment: Retired  PATIENT GOALS: Improve speech  PREVIOUS TREATMENT: Pt accompanied to therapy by his son, [name]. Both were excited to report improvement in his expressive language skills since his evaluation last week. SLP continued diagnostic therapy due to rapidly evolving language skills and improved awareness. Pt answered a series of personally relevant "wh" questions (dentist, gas station, pharmacy, etc) with overall 70% acc with mi/mod assist (phonemic and written cues) and completed confrontation naming of high frequency pictured objects with 85% acc (improvement from 0% last week). He was given a printout  of word finding strategies and was able to describe common objects with min prompts from the list. Pt is making excellent progress toward goals. Consider further assessment next session of writing and reading.                                                                                                                    TREATMENT DATE: 10/29/23 [name] accompanied his father to therapy today. Pt completed reading comprehension and word finding HEP with 95% acc. He completed the [employer] form test with 7/15 acc and increased to 13/15 with phonemic cue provided.  PATIENT EDUCATION: Education details: Provided naming to description and moderately complex divergent naming task for HEP Person educated: Patient and Child(ren) Education method: Explanation and Handouts Education comprehension: verbalized understanding   GOALS: Goals reviewed with patient? Yes  SHORT TERM GOALS: Target date: 11/21/2023  Pt will increase naming of common objects/pictures to 90% acc when provided with mod multimodality cues Baseline: 50% max cues Goal status: ONGOING  2.  Pt will complete functional check writing and balancing tasks with 100% acc with min assist and use of compensatory strategies as needed. Baseline: writes single words with 50% acc Goal status: ONGOING  3.  Pt will implement word-finding strategies with 80% accuracy when unable to verbalize desired word in conversation/functional tasks with mi/mod assist. Baseline: 75% mod assist Goal status: ONGOING  4.  Pt will describe objects and pictures by providing at least three salient features as judged by clinician with 90% acc when provided mi/mod cues.  Baseline:  Goal status: ONGOING  5.  Pt will complete reading comprehension tasks (1-2 sentences) with 80% acc and min assist via multiple choice response. Baseline: varies from 20-50% Goal status: ONGOING  6.  Pt will be able to write short grocery lists, phone messages with 80% acc and min cues. Baseline: 60% mod cues Goal status: ONGOING  LONG TERM GOALS: Target date: February 11, 2024  Pt will communicate moderate level wants/needs/thoughts to family and friends with use of multimodality communication strategies as needed.   Baseline: Mod  assist Goal status: ONGOING  ASSESSMENT:  CLINICAL IMPRESSION: (from evaluation 10/17/2023) Patient is a 68 y.o. male who was seen today for a speech/language evaluation s/p recent CVA. He presents with fluent aphasia with some suspicion for apraxia and moderate receptive deficits. Pt has made significant improvement since he was discharged home, however he continues to be unaware of word substitutions in connected speech. He scored 8/50 on the expressive portion of the MAST and 16/50 on the receptive portion, however functionally, he is able to follow along and participate in short high interest conversations with use of total communication strategies (written cues from SLP to guide conversation, gestures, drawing, and real words). He was unable to name common objects without a phonemic cue and tended to perseverate on numbers at times. He demonstrated relative strengths in auditory comprehension for single words, basic yes/no questions, and object recognition. He was able to repeat single words  75% of the time (he was unable to repeat in the hospital). Pt appears slightly impulsive with some attention difficulties, which impact receptive and expressive skills. He is motivated to improve his ability to communicate and has excellent family support.   OBJECTIVE IMPAIRMENTS: include awareness, executive functioning, expressive language, receptive language, aphasia, and apraxia. These impairments are limiting patient from managing medications, managing appointments, managing finances, and effectively communicating at home and in community. Factors affecting potential to achieve goals and functional outcome are ability to learn/carryover information, severity of impairments, and family/community support. Patient will benefit from skilled SLP services to address above impairments and improve overall function.  REHAB POTENTIAL: Excellent  PLAN:  SLP FREQUENCY: 2x/week  SLP DURATION: 8 weeks  PLANNED  INTERVENTIONS: Language facilitation, Cueing hierachy, Internal/external aids, Functional tasks, Multimodal communication approach, SLP instruction and feedback, Compensatory strategies, Patient/family education, and [id number] Treatment of speech (30 or 45 min)    Thank you,  Havery Moros, CCC-SLP [phone]  Annette Bertelson, CCC-SLP 10/29/2023, 7:28 PM

## 2023-10-29 NOTE — Therapy (Signed)
 OUTPATIENT SPEECH LANGUAGE PATHOLOGY APHASIA TREATMENT   Patient Name: Howard Johnson MRN: [id number] DOB:03/19/1956, 68 y.o., male Today's Date: 10/29/2023  PCP: Benita Stabile, MD REFERRING PROVIDER: Jacquelynn Cree, PA-C  END OF SESSION:    10/23/23 1907  SLP Visits / Re-Eval  Visit Number 2  Number of Visits 17  Date for SLP Re-Evaluation 12/19/23  Authorization  Authorization Type [employer] (eff: 08/14/23 oop: 3400 copay 5 no auth spoke with erica t ref# [id number])  SLP Time Calculation  SLP Start Time 1015  SLP Stop Time  1100  SLP Time Calculation (min) 45 min  SLP - End of Session  Activity Tolerance Patient tolerated treatment well    Past Medical History:  Diagnosis Date   Achilles tendinitis    Left   Diabetes mellitus without complication (HCC)    Elevated PSA    Hyperlipidemia    Hypertension    Right knee pain    Shoulder pain, bilateral    Past Surgical History:  Procedure Laterality Date   TONSILLECTOMY     Patient Active Problem List   Diagnosis Date Noted   Chronic kidney disease 10/10/2023   ICH (intracerebral hemorrhage) (HCC) 10/01/2023   Essential hypertension 10/01/2023   Hyperlipidemia 10/01/2023   DM (diabetes mellitus), type 2 (HCC) 10/01/2023   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 Patient displayed word salad, tachycardia and indications of acute  stroke event. Head CT did identify a small foci of hemorrhage in left temporal lobe. Brain MRI revealed subarachnoid hemorrhage with concerns of dural venous sinus thrombosis. CTA was negative for large venous obstruction. CT venogram was performed and confirmed dural venous sinus thrombosis which was exclusive to nearly occlusive throughout left transverse and sigmoid sinus extending into left upper internal jugular and progression of patchy temporal hematoma. Patient continued to have significant and profound global aphasia with some early reports of possible hallucinations. EEG showed moderate to severe diffuse slowing indicating global cerebral dysfunction but no seizures noted. Follow-up CT showed stable hemorrhage. Pt was at [facility] from 10/02/23-10/08/23 and was discharged home with family.  PAIN:  Are you having pain? No  FALLS: Has patient fallen in last 6 months?  No  LIVING ENVIRONMENT: Lives with: lives with their family Lives in: House/apartment  PLOF:  Level of assistance: Independent with ADLs, Independent with IADLs Employment: Retired  PATIENT GOALS: Improve speech  TREATMENT DATE: 10/29/23 Pt accompanied to therapy by his son, [name]. Both were excited to report improvement in his expressive language skills since his evaluation last week. SLP continued diagnostic therapy due to rapidly evolving language skills and improved awareness. Pt answered a series of personally relevant "wh" questions (dentist, gas station, pharmacy, etc) with overall 70% acc with mi/mod assist (phonemic and written cues) and completed confrontation naming of high frequency pictured objects with 85% acc (improvement from 0% last week). He was given a printout of word finding strategies and was able to describe common objects with min prompts from the list. Pt is making excellent progress toward goals.  Consider further assessment next session of writing and reading.   PATIENT EDUCATION: Education details: Provided several pages of reading comprehension and divergent naming tasks Person educated: Patient and Child(ren) Education method: Explanation and Handouts Education comprehension: verbalized understanding   GOALS: Goals reviewed with patient? Yes  SHORT TERM GOALS: Target date: 11/21/2023  Pt will increase naming of common objects/pictures to 90% acc when provided with mod multimodality cues Baseline: 50% max cues Goal status: ONGOING  2.  Pt will complete functional check writing and balancing tasks with 100% acc with min assist and use of compensatory strategies as needed. Baseline: writes single words with 50% acc Goal status: ONGOING  3.  Pt will implement word-finding strategies with 80% accuracy when unable to verbalize desired word in conversation/functional tasks with mi/mod assist. Baseline: 75% mod assist Goal status: ONGOING  4.  Pt will describe objects and pictures by providing at least three salient features as judged by clinician with 90% acc when provided mi/mod cues.  Baseline:  Goal status: ONGOING  5.  Pt will complete reading comprehension tasks (1-2 sentences) with 80% acc and min assist via multiple choice response. Baseline: varies from 20-50% Goal status: ONGOING  6.  Pt will be able to write short grocery lists, phone messages with 80% acc and min cues. Baseline: 60% mod cues Goal status: ONGOING  LONG TERM GOALS: Target date: February 11, 2024  Pt will communicate moderate level wants/needs/thoughts to family and friends with use of multimodality communication strategies as needed.   Baseline: Mod assist Goal status: ONGOING  ASSESSMENT:  CLINICAL IMPRESSION: (from evaluation 10/17/2023) Patient is a 67 y.o. male who was seen today for a speech/language evaluation s/p recent CVA. He presents with fluent aphasia with some suspicion for apraxia and  moderate receptive deficits. Pt has made significant improvement since he was discharged home, however he continues to be unaware of word substitutions in connected speech. He scored 8/50 on the expressive portion of the MAST and 16/50 on the receptive portion, however functionally, he is able to follow along and participate in short high interest conversations with use of total communication strategies (written cues from SLP to guide conversation, gestures, drawing, and real words). He was unable to name common objects without a phonemic cue and tended to perseverate on numbers at times. He demonstrated relative strengths in auditory comprehension for single words, basic yes/no questions, and object recognition. He was able to repeat single words 75% of the time (he was unable to repeat in the hospital). Pt appears slightly impulsive with some attention difficulties, which impact receptive and expressive skills. He is motivated to improve his ability to communicate and has excellent family support.   OBJECTIVE IMPAIRMENTS: include awareness, executive functioning, expressive language, receptive language, aphasia, and apraxia. These impairments are limiting patient from managing medications, managing appointments, managing finances, and  effectively communicating at home and in community. Factors affecting potential to achieve goals and functional outcome are ability to learn/carryover information, severity of impairments, and family/community support. Patient will benefit from skilled SLP services to address above impairments and improve overall function.  REHAB POTENTIAL: Excellent  PLAN:  SLP FREQUENCY: 2x/week  SLP DURATION: 8 weeks  PLANNED INTERVENTIONS: Language facilitation, Cueing hierachy, Internal/external aids, Functional tasks, Multimodal communication approach, SLP instruction and feedback, Compensatory strategies, Patient/family education, and [id number] Treatment of speech (30 or 45 min)     Thank you,  Havery Moros, CCC-SLP [phone]  Miracle Mongillo, CCC-SLP 10/29/2023, 7:12 PM

## 2023-10-31 ENCOUNTER — Ambulatory Visit (HOSPITAL_COMMUNITY): Admitting: Speech Pathology

## 2023-10-31 ENCOUNTER — Encounter (HOSPITAL_COMMUNITY): Payer: Self-pay | Admitting: Speech Pathology

## 2023-10-31 DIAGNOSIS — R4701 Aphasia: Secondary | ICD-10-CM

## 2023-10-31 DIAGNOSIS — I69890 Apraxia following other cerebrovascular disease: Secondary | ICD-10-CM

## 2023-10-31 DIAGNOSIS — R41841 Cognitive communication deficit: Secondary | ICD-10-CM

## 2023-10-31 NOTE — Therapy (Signed)
 OUTPATIENT SPEECH LANGUAGE PATHOLOGY APHASIA TREATMENT   Patient Name: Howard Johnson MRN: [id number] DOB:1956-05-29, 68 y.o., male Today's Date: 10/31/2023  PCP: Benita Stabile, MD REFERRING PROVIDER: Jacquelynn Cree, PA-C  END OF SESSION:  End of Session - 10/31/23 0935     Visit Number 4    Number of Visits 17    Date for SLP Re-Evaluation 12/19/23    Authorization Type [employer]   eff: 08/14/23 oop: 3400 copay 5 no auth spoke with erica t ref# [id number]   SLP Start Time 0935    SLP Stop Time  1020    SLP Time Calculation (min) 45 min    Activity Tolerance Patient tolerated treatment well             Past Medical History:  Diagnosis Date   Achilles tendinitis    Left   Diabetes mellitus without complication (HCC)    Elevated PSA    Hyperlipidemia    Hypertension    Right knee pain    Shoulder pain, bilateral    Past Surgical History:  Procedure Laterality Date   TONSILLECTOMY     Patient Active Problem List   Diagnosis Date Noted   Chronic kidney disease 10/10/2023   ICH (intracerebral hemorrhage) (HCC) 10/01/2023   Essential hypertension 10/01/2023   Hyperlipidemia 10/01/2023   DM (diabetes mellitus), type 2 (HCC) 10/01/2023   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 Patient displayed word salad, tachycardia and indications of acute stroke event. Head CT did  identify a small foci of hemorrhage in left temporal lobe. Brain MRI revealed subarachnoid hemorrhage with concerns of dural venous sinus thrombosis. CTA was negative for large venous obstruction. CT venogram was performed and confirmed dural venous sinus thrombosis which was exclusive to nearly occlusive throughout left transverse and sigmoid sinus extending into left upper internal jugular and progression of patchy temporal hematoma. Patient continued to have significant and profound global aphasia with some early reports of possible hallucinations. EEG showed moderate to severe diffuse slowing indicating global cerebral dysfunction but no seizures noted. Follow-up CT showed stable hemorrhage. Pt was at [facility] from 10/02/23-10/08/23 and was discharged home with family.  PAIN:  Are you having pain? No  FALLS: Has patient fallen in last 6 months?  No  LIVING ENVIRONMENT: Lives with: lives with their family Lives in: House/apartment  PLOF:  Level of assistance: Independent with ADLs, Independent with IADLs Employment: Retired  PATIENT GOALS: Improve speech  PREVIOUS TREATMENT: [name] accompanied his father to therapy today. Pt completed reading comprehension and word finding HEP with 95% acc. He completed the [employer] form test with 7/15 acc and increased to 13/15 with phonemic cue provided. He was able to write a check out to designated place and completed with 90% acc with error only for adding an extra number. He had difficulty completing clock drawing task for "10 minutes to 11 o'clock", but completed change making task without difficulty, so this appears to be a language difficulty.  He orally read a paragraph with 7 sentences with ~75% accuracy with some word omissions, additions, and sound errors. He was encouraged to practice oral reading at home and to focus on accuracy over speed (he does most things quickly). Pt continues to improve in his expressive language skills daily. Next session,  target oral reading and picture description tasks.                                                                                                                   TREATMENT DATE: 10/31/23 Pt accompanied to therapy by his wife, [name]. He completed confrontation naming task with low frequency objects with 100% acc with min cues and allowance for self corrections. He was able to elicit the accurate label of "salt shaker" when he was cued to pretend to use the object. Paraphasic errors occur, but with less frequency and he is more aware of the substitution (barrot for parrot, mupcay for muffin (was confusing muffin and cupcake and combined them). He completed a picture description task and was recorded. This was the same picture used during his initial evaluation and SLP provided the two recordings for him to listen. His accuracy improved by over 90% from initial evaluation. Pt is making dramatic improvement in his expressive language skills. Next session, use pictures of "absurdities" to describe and continue implementation of word finding strategies in conversation. HEP provided.   PATIENT EDUCATION: Education details: Provided naming to description and moderately complex divergent naming task for HEP Person educated: Patient and Child(ren) Education method: Explanation and Handouts Education comprehension: verbalized understanding   GOALS: Goals reviewed with patient? Yes  SHORT TERM GOALS: Target date: 11/21/2023  Pt will increase naming of common objects/pictures to 90% acc when provided with mod multimodality cues Baseline: 50% max cues Goal status: ONGOING  2.  Pt will complete functional check writing and balancing tasks with 100% acc with min assist and use of compensatory strategies as needed. Baseline: writes single words with 50% acc Goal status: ONGOING  3.  Pt will implement word-finding strategies with 80% accuracy when unable to verbalize desired word in  conversation/functional tasks with mi/mod assist. Baseline: 75% mod assist Goal status: ONGOING  4.  Pt will describe objects and pictures by providing at least three salient features as judged by clinician with 90% acc when provided mi/mod cues.  Baseline:  Goal status: ONGOING  5.  Pt will complete reading comprehension tasks (1-2 sentences) with 80% acc and min assist via multiple choice response. Baseline: varies from 20-50% Goal status: ONGOING  6.  Pt will be able to write short grocery lists, phone messages with 80% acc and min cues. Baseline: 60% mod cues Goal status: ONGOING  LONG TERM GOALS: Target date: February 11, 2024  Pt will communicate moderate level wants/needs/thoughts to family and friends with use of multimodality communication strategies as needed.   Baseline: Mod assist Goal status: ONGOING  ASSESSMENT:  CLINICAL IMPRESSION: (from evaluation 10/17/2023) Patient is a 68 y.o. male who was seen  today for a speech/language evaluation s/p recent CVA. He presents with fluent aphasia with some suspicion for apraxia and moderate receptive deficits. Pt has made significant improvement since he was discharged home, however he continues to be unaware of word substitutions in connected speech. He scored 8/50 on the expressive portion of the MAST and 16/50 on the receptive portion, however functionally, he is able to follow along and participate in short high interest conversations with use of total communication strategies (written cues from SLP to guide conversation, gestures, drawing, and real words). He was unable to name common objects without a phonemic cue and tended to perseverate on numbers at times. He demonstrated relative strengths in auditory comprehension for single words, basic yes/no questions, and object recognition. He was able to repeat single words 75% of the time (he was unable to repeat in the hospital). Pt appears slightly impulsive with some attention difficulties,  which impact receptive and expressive skills. He is motivated to improve his ability to communicate and has excellent family support.   OBJECTIVE IMPAIRMENTS: include awareness, executive functioning, expressive language, receptive language, aphasia, and apraxia. These impairments are limiting patient from managing medications, managing appointments, managing finances, and effectively communicating at home and in community. Factors affecting potential to achieve goals and functional outcome are ability to learn/carryover information, severity of impairments, and family/community support. Patient will benefit from skilled SLP services to address above impairments and improve overall function.  REHAB POTENTIAL: Excellent  PLAN:  SLP FREQUENCY: 2x/week  SLP DURATION: 8 weeks  PLANNED INTERVENTIONS: Language facilitation, Cueing hierachy, Internal/external aids, Functional tasks, Multimodal communication approach, SLP instruction and feedback, Compensatory strategies, Patient/family education, and [id number] Treatment of speech (30 or 45 min)    Thank you,  Havery Moros, CCC-SLP [phone]  Aaminah Forrester, CCC-SLP 10/31/2023, 9:35 AM

## 2023-11-01 ENCOUNTER — Ambulatory Visit (HOSPITAL_COMMUNITY)
Admission: RE | Admit: 2023-11-01 | Discharge: 2023-11-01 | Disposition: A | Discharge status: Home / Self Care | Source: Ambulatory Visit | Attending: Family Medicine | Admitting: Family Medicine

## 2023-11-01 DIAGNOSIS — R590 Localized enlarged lymph nodes: Secondary | ICD-10-CM | POA: Insufficient documentation

## 2023-11-01 DIAGNOSIS — R609 Edema, unspecified: Secondary | ICD-10-CM | POA: Diagnosis not present

## 2023-11-04 ENCOUNTER — Other Ambulatory Visit: Payer: Self-pay

## 2023-11-04 ENCOUNTER — Encounter (HOSPITAL_COMMUNITY): Payer: Self-pay

## 2023-11-04 ENCOUNTER — Telehealth: Payer: Self-pay

## 2023-11-04 ENCOUNTER — Other Ambulatory Visit (HOSPITAL_COMMUNITY): Payer: Self-pay

## 2023-11-04 ENCOUNTER — Emergency Department (HOSPITAL_COMMUNITY)
Admission: EM | Admit: 2023-11-04 | Discharge: 2023-11-04 | Disposition: A | Discharge status: Home / Self Care | Attending: Emergency Medicine | Admitting: Emergency Medicine

## 2023-11-04 DIAGNOSIS — Z91040 Latex allergy status: Secondary | ICD-10-CM | POA: Insufficient documentation

## 2023-11-04 DIAGNOSIS — Z7901 Long term (current) use of anticoagulants: Secondary | ICD-10-CM | POA: Insufficient documentation

## 2023-11-04 DIAGNOSIS — Z794 Long term (current) use of insulin: Secondary | ICD-10-CM | POA: Diagnosis not present

## 2023-11-04 DIAGNOSIS — K130 Diseases of lips: Secondary | ICD-10-CM | POA: Diagnosis not present

## 2023-11-04 DIAGNOSIS — R600 Localized edema: Secondary | ICD-10-CM | POA: Diagnosis not present

## 2023-11-04 DIAGNOSIS — R220 Localized swelling, mass and lump, head: Secondary | ICD-10-CM

## 2023-11-04 HISTORY — DX: Cerebral infarction, unspecified: I63.9

## 2023-11-04 MED ORDER — SULFAMETHOXAZOLE-TRIMETHOPRIM 800-160 MG PO TABS
1.0000 | ORAL_TABLET | Freq: Once | ORAL | Status: AC
  Administered 2023-11-04: 1 via ORAL
  Filled 2023-11-04: qty 1

## 2023-11-04 MED ORDER — SULFAMETHOXAZOLE-TRIMETHOPRIM 800-160 MG PO TABS
1.0000 | ORAL_TABLET | Freq: Two times a day (BID) | ORAL | 0 refills | Status: AC
Start: 2023-11-04 — End: 2023-11-07

## 2023-11-04 MED ORDER — DEXAMETHASONE SODIUM PHOSPHATE 10 MG/ML IJ SOLN
10.0000 mg | Freq: Once | INTRAMUSCULAR | Status: DC
  Filled 2023-11-04: qty 1

## 2023-11-04 NOTE — ED Provider Notes (Signed)
 [place] EMERGENCY DEPARTMENT AT [facility] Provider Note   CSN: [id number] Arrival date & time: 11/04/23  3244     History  Chief Complaint  Patient presents with   Oral Swelling    Howard Johnson is a 68 y.o. male.  HPI Patient presents with his son who assists with the history.  History is notable for stroke about 1 month ago.  Patient has been recovering generally well, with improved speech, ability to ambulate.  Today, however, the patient noticed swelling in the left lateral lower lip.  He states he felt as though there was a pimple forming.  With pressure application he expressed some contents, notes no longer having severe focal pain, but does feel distended in the area.  No difficulty swallowing, speaking that is new, breathing or other changes.    Home Medications Prior to Admission medications   Medication Sig Start Date End Date Taking? Authorizing Provider  apixaban (ELIQUIS) 5 MG TABS tablet Take 1 tablet (5 mg total) by mouth 2 (two) times daily. 10/07/23   Love, Evlyn Kanner, PA-C  fluticasone (FLONASE) 50 MCG/ACT nasal spray Place 2 sprays into both nostrils daily. 10/08/23   Love, Evlyn Kanner, PA-C  insulin glargine (LANTUS) 100 UNIT/ML Solostar Pen Inject 20 Units into the skin daily. 10/07/23   Love, Evlyn Kanner, PA-C  Insulin Pen Needle 32G X 4 MM MISC Use as directed with insulin 10/07/23   Love, Evlyn Kanner, PA-C  loratadine (CLARITIN) 10 MG tablet Take 1 tablet (10 mg total) by mouth daily. 10/08/23   Love, Evlyn Kanner, PA-C  rosuvastatin (CRESTOR) 10 MG tablet Take 1 tablet (10 mg total) by mouth daily with supper. 10/07/23   Love, Evlyn Kanner, PA-C      Allergies    Latex and Tape    Review of Systems   Review of Systems  Physical Exam Updated Vital Signs BP (!) 159/84 (BP Location: Right Arm)   Pulse 75   Temp 98.2 F (36.8 C) (Oral)   Resp 16   Ht 6\' 4"  (1.93 m)   Wt 94.6 kg   SpO2 100%   BMI 25.39 kg/m  Physical Exam Vitals and nursing note reviewed.   Constitutional:      General: He is not in acute distress.    Appearance: He is well-developed.  HENT:     Head: Normocephalic and atraumatic.     Mouth/Throat:   Eyes:     Conjunctiva/sclera: Conjunctivae normal.  Cardiovascular:     Rate and Rhythm: Normal rate and regular rhythm.  Pulmonary:     Effort: Pulmonary effort is normal. No respiratory distress.     Breath sounds: No stridor.  Abdominal:     General: There is no distension.  Skin:    General: Skin is warm and dry.  Neurological:     Mental Status: He is alert and oriented to person, place, and time.     Comments: Speech is dysarthric, patient, son, both note this is improved since stroke.     ED Results / Procedures / Treatments   Labs (all labs ordered are listed, but only abnormal results are displayed) Labs Reviewed - No data to display  EKG None  Radiology No results found.  Procedures Procedures    Medications Ordered in ED Medications  dexamethasone (DECADRON) injection 10 mg (has no administration in time range)  sulfamethoxazole-trimethoprim (BACTRIM DS) 800-160 MG per tablet 1 tablet (has no administration in time range)    ED  Course/ Medical Decision Making/ A&P                                 Medical Decision Making Adult male presents with new left lower lip swelling.  Patient has no medications immediately suggestive of angioedema.  He has no vital signs suggesting anaphylaxis, and no evidence for airway compromise.  Suspicion for local infection versus edema, patient started steroids, antibiotics, will be monitored. Cardiac 75 sinus normal Pulse ox 100% room air normal  Amount and/or Complexity of Data Reviewed Independent Historian:     Details: Patient's son is at bedside. External Data Reviewed: notes.    Details: Stroke  Risk Prescription drug management. Decision regarding hospitalization.   On repeat exam patient and similar condition, he has remained hemodynamically  unremarkable, no evidence for progression of disease, suspicion remains for focal infection versus edema.  Patient will continue meds, follow-up closely with outpatient team.   Final Clinical Impression(s) / ED Diagnoses Final diagnoses:  Lip swelling    Rx / DC Orders ED Discharge Orders     None         Gerhard Munch, MD 11/04/23 1237

## 2023-11-04 NOTE — Progress Notes (Signed)
   11/04/2023  Patient ID: Howard Johnson, male   DOB: 03/10/1956, 68 y.o.   MRN: [id number]   Med Assistance  Howard Johnson came into the office because he ran out of Lantus for his father [name]. He told me he went to get it filled again and was told it was too soon until 12/03/23. He showed me his box, which was empty, and told me he was using 15 units daily but thought the pens only had 100 units each and had been throwing them away once he reached 100 units of use.  I provided him a sample box of Lantus which included 5 full pens. If [name] continues to use 15 units daily this should last 100 days. I showed Howard Johnson how to determine how much insulin is left in each pen and let him know that each pen should have 300 units of insulin.   [name], PharmD

## 2023-11-04 NOTE — Discharge Instructions (Signed)
 As discussed, your evaluation today has been largely reassuring.  But, it is important that you monitor your condition carefully, and do not hesitate to return to the ED if you develop new, or concerning changes in your condition. ? ?Otherwise, please follow-up with your physician for appropriate ongoing care. ? ?

## 2023-11-04 NOTE — ED Notes (Signed)
 Pt lying in bed dying pain at this time. Lower half of mouth is swollen no active bleeding seen. Pt is able to communicate without issue. Denies any trouble with hearing, swallowing, or smiling.

## 2023-11-04 NOTE — ED Notes (Signed)
 MD notified of refusal of Decadron due to pt concern of elevated blood sugar from steroids in the past.

## 2023-11-04 NOTE — ED Triage Notes (Signed)
 Pt arrived via POV c/o bottom lip swelling that began yesterday. Pt reports applying betadine to the lip to try to remove a pimple and Pt reports popping it.

## 2023-11-06 ENCOUNTER — Encounter (HOSPITAL_COMMUNITY): Payer: Self-pay | Admitting: Speech Pathology

## 2023-11-06 ENCOUNTER — Ambulatory Visit (HOSPITAL_COMMUNITY): Admitting: Speech Pathology

## 2023-11-06 DIAGNOSIS — R4701 Aphasia: Secondary | ICD-10-CM | POA: Diagnosis not present

## 2023-11-06 DIAGNOSIS — R41841 Cognitive communication deficit: Secondary | ICD-10-CM

## 2023-11-06 DIAGNOSIS — I69890 Apraxia following other cerebrovascular disease: Secondary | ICD-10-CM

## 2023-11-06 NOTE — Therapy (Signed)
 OUTPATIENT SPEECH LANGUAGE PATHOLOGY APHASIA TREATMENT   Patient Name: Howard Johnson MRN: [id number] DOB:10-11-1955, 68 y.o., male Today's Date: 11/06/2023  PCP: Howard Stabile, MD REFERRING PROVIDER: Jacquelynn Cree, PA-C  END OF SESSION:  End of Session - 11/06/23 0942     Visit Number 5    Number of Visits 17    Date for SLP Re-Evaluation 12/19/23    Authorization Type [employer]   eff: 08/14/23 oop: 3400 copay 5 no auth spoke with Howard Johnson t ref# [id number]   SLP Start Time 0930    SLP Stop Time  1015    SLP Time Calculation (min) 45 min    Activity Tolerance Patient tolerated treatment well             Past Medical History:  Diagnosis Date   Achilles tendinitis    Left   Diabetes mellitus without complication (HCC)    Elevated PSA    Hyperlipidemia    Hypertension    Right knee pain    Shoulder pain, bilateral    Stroke [facility])    Past Surgical History:  Procedure Laterality Date   TONSILLECTOMY     Patient Active Problem List   Diagnosis Date Noted   Chronic kidney disease 10/10/2023   ICH (intracerebral hemorrhage) (HCC) 10/01/2023   Essential hypertension 10/01/2023   Hyperlipidemia 10/01/2023   DM (diabetes mellitus), type 2 (HCC) 10/01/2023   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 Patient displayed word salad, tachycardia and  indications of acute stroke event. Head CT did identify a small foci of hemorrhage in left temporal lobe. Brain MRI revealed subarachnoid hemorrhage with concerns of dural venous sinus thrombosis. CTA was negative for large venous obstruction. CT venogram was performed and confirmed dural venous sinus thrombosis which was exclusive to nearly occlusive throughout left transverse and sigmoid sinus extending into left upper internal jugular and progression of patchy temporal hematoma. Patient continued to have significant and profound global aphasia with some early reports of possible hallucinations. EEG showed moderate to severe diffuse slowing indicating global cerebral dysfunction but no seizures noted. Follow-up CT showed stable hemorrhage. Pt was at [facility] from 10/02/23-10/08/23 and was discharged home with family.  PAIN:  Are you having pain? No  FALLS: Has patient fallen in last 6 months?  No  LIVING ENVIRONMENT: Lives with: lives with their family Lives in: House/apartment  PLOF:  Level of assistance: Independent with ADLs, Independent with IADLs Employment: Retired  PATIENT GOALS: Improve speech  PREVIOUS TREATMENT: Pt accompanied to therapy by his wife, Howard Johnson. He completed confrontation naming task with low frequency objects with 100% acc with min cues and allowance for self corrections. He was able to elicit the accurate label of "salt shaker" when he was cued to pretend to use the object. Paraphasic errors occur, but with less frequency and he is more aware of the substitution (barrot for parrot, mupcay for muffin (was confusing muffin and cupcake and combined  them). He completed a picture description task and was recorded. This was the same picture used during his initial evaluation and SLP provided the two recordings for him to listen. His accuracy improved by over 90% from initial evaluation. Pt is making dramatic improvement in his expressive language skills. Next session, use pictures of  "absurdities" to describe and continue implementation of word finding strategies in conversation. HEP provided.                                                                                                                   TREATMENT DATE: 11/06/23 Pt was accompanied to today's appointment by his wife, Howard Johnson. Both report continued improvement in expressive speech. He self reflected that he has more difficulty when he has to switch topics in a conversation. He participated in a conversation regarding his job with the [employer]. He was asked to share job duties and initially started listing words: administration, Production designer, theatre/television/film, checks, payroll, reports, three people. With some guidance, he presented a typical day/routine. He currently does not have a personal cell phone, as he had to return his to his work, but family is working on getting him one. He was asked to write short messages to dictation to simulate a message from a medical practice regarding appointments. He was encouraged to ask for repetition if needed and to share with the communication partner what he needs (slow down, etc). He was able to write 75% of the important details down after 1-2 readings. He orally read a short paragraph with ~95% acc with allowance for self corrections. Pt stated that he would like to be able to improve spelling and sending emails/texts to friends, so we will target this going forward and adjust goals for more appointments.   PATIENT EDUCATION: Education details: Provided naming to description and moderately complex divergent naming task for HEP Person educated: Patient and Child(ren) Education method: Explanation and Handouts Education comprehension: verbalized understanding   GOALS: Goals reviewed with patient? Yes  SHORT TERM GOALS: Target date: 11/21/2023  Pt will increase naming of common objects/pictures to 90% acc when provided with mod multimodality cues Baseline: 50% max cues Goal  status: ONGOING  2.  Pt will complete functional check writing and balancing tasks with 100% acc with min assist and use of compensatory strategies as needed. Baseline: writes single words with 50% acc Goal status: ONGOING  3.  Pt will implement word-finding strategies with 80% accuracy when unable to verbalize desired word in conversation/functional tasks with mi/mod assist. Baseline: 75% mod assist Goal status: ONGOING  4.  Pt will describe objects and pictures by providing at least three salient features as judged by clinician with 90% acc when provided mi/mod cues.  Baseline:  Goal status: ONGOING  5.  Pt will complete reading comprehension tasks (1-2 sentences) with 80% acc and min assist via multiple choice response. Baseline: varies from 20-50% Goal status: ONGOING  6.  Pt will be able to write short grocery lists, phone messages with 80% acc  and min cues. Baseline: 60% mod cues Goal status: ONGOING  LONG TERM GOALS: Target date: February 11, 2024  Pt will communicate moderate level wants/needs/thoughts to family and friends with use of multimodality communication strategies as needed.   Baseline: Mod assist Goal status: ONGOING  ASSESSMENT:  CLINICAL IMPRESSION: (from evaluation 10/17/2023) Patient is a 68 y.o. male who was seen today for a speech/language evaluation s/p recent CVA. He presents with fluent aphasia with some suspicion for apraxia and moderate receptive deficits. Pt has made significant improvement since he was discharged home, however he continues to be unaware of word substitutions in connected speech. He scored 8/50 on the expressive portion of the MAST and 16/50 on the receptive portion, however functionally, he is able to follow along and participate in short high interest conversations with use of total communication strategies (written cues from SLP to guide conversation, gestures, drawing, and real words). He was unable to name common objects without a phonemic  cue and tended to perseverate on numbers at times. He demonstrated relative strengths in auditory comprehension for single words, basic yes/no questions, and object recognition. He was able to repeat single words 75% of the time (he was unable to repeat in the Johnson). Pt appears slightly impulsive with some attention difficulties, which impact receptive and expressive skills. He is motivated to improve his ability to communicate and has excellent family support.   OBJECTIVE IMPAIRMENTS: include awareness, executive functioning, expressive language, receptive language, aphasia, and apraxia. These impairments are limiting patient from managing medications, managing appointments, managing finances, and effectively communicating at home and in community. Factors affecting potential to achieve goals and functional outcome are ability to learn/carryover information, severity of impairments, and family/community support. Patient will benefit from skilled SLP services to address above impairments and improve overall function.  REHAB POTENTIAL: Excellent  PLAN:  SLP FREQUENCY: 2x/week  SLP DURATION: 8 weeks  PLANNED INTERVENTIONS: Language facilitation, Cueing hierachy, Internal/external aids, Functional tasks, Multimodal communication approach, SLP instruction and feedback, Compensatory strategies, Patient/family education, and [address] Treatment of speech (30 or 45 min)    Thank you,  Havery Moros, CCC-SLP [phone]  Zarayah Lanting, CCC-SLP 11/06/2023, 9:43 AM

## 2023-11-07 ENCOUNTER — Encounter (HOSPITAL_COMMUNITY): Payer: Self-pay | Admitting: Speech Pathology

## 2023-11-07 ENCOUNTER — Ambulatory Visit (HOSPITAL_COMMUNITY): Payer: HMO

## 2023-11-07 ENCOUNTER — Ambulatory Visit (HOSPITAL_COMMUNITY): Admitting: Speech Pathology

## 2023-11-07 ENCOUNTER — Encounter (HOSPITAL_COMMUNITY): Payer: Self-pay

## 2023-11-07 ENCOUNTER — Other Ambulatory Visit: Payer: Self-pay

## 2023-11-07 DIAGNOSIS — R4701 Aphasia: Secondary | ICD-10-CM

## 2023-11-07 DIAGNOSIS — G08 Intracranial and intraspinal phlebitis and thrombophlebitis: Secondary | ICD-10-CM

## 2023-11-07 DIAGNOSIS — I69890 Apraxia following other cerebrovascular disease: Secondary | ICD-10-CM

## 2023-11-07 DIAGNOSIS — R41841 Cognitive communication deficit: Secondary | ICD-10-CM

## 2023-11-07 NOTE — Therapy (Signed)
 OUTPATIENT PHYSICAL THERAPY NEURO EVALUATION   Patient Name: Howard Johnson MRN: [id number] DOB:[birth year], 68 y.o., male Today's Date: 11/07/2023   PCP: Nita Sells, MD REFERRING PROVIDER: Delle Reining, PA-C  END OF SESSION:  PT End of Session - 11/07/23 0951     Visit Number 1    Authorization Type [employer]    Authorization Time Period no auth    PT Start Time 0935    PT Stop Time 0950    PT Time Calculation (min) 15 min    Behavior During Therapy [facility] for tasks assessed/performed             Past Medical History:  Diagnosis Date   Achilles tendinitis    Left   Diabetes mellitus without complication (HCC)    Elevated PSA    Hyperlipidemia    Hypertension    Right knee pain    Shoulder pain, bilateral    Stroke [facility])    Past Surgical History:  Procedure Laterality Date   TONSILLECTOMY     Patient Active Problem List   Diagnosis Date Noted   Chronic kidney disease 10/10/2023   ICH (intracerebral hemorrhage) (HCC) 10/01/2023   Essential hypertension 10/01/2023   Hyperlipidemia 10/01/2023   DM (diabetes mellitus), type 2 (HCC) 10/01/2023   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 Pt's son reporting that he and pt walk 2 mile every day. Mowed the yard yesterday per son and pt. Lives with son and wife. No issues with stairs in the home. Note no major  concerns with function Pt accompanied by: family member  PERTINENT HISTORY: 68 y.o. male with history of T2DM, HTN, CKD. He was admitted on 09/25/2013 with confusion, agitation and abnormal speech.  MRI brain done revealing SAH with concerns of dural sinus venous thrombosis.   PAIN:  Are you having pain? No  PRECAUTIONS: None  RED FLAGS: None   WEIGHT BEARING RESTRICTIONS: No  FALLS: Has patient fallen in last 6 months? No   PATIENT GOALS: None established  OBJECTIVE:  Note: Objective measures were completed at  Evaluation unless otherwise noted.  COGNITION: Overall cognitive status: Within functional limits for tasks assessed and Asphasia but unable to comprehend all conversation.    SENSATION: WFL  COORDINATION: WFL  POSTURE: No Significant postural limitations  LOWER EXTREMITY ROM:     Active  Right Eval Left Eval  Hip flexion    Hip extension    Hip abduction    Hip adduction    Hip internal rotation    Hip external rotation    Knee flexion    Knee extension    Ankle dorsiflexion    Ankle plantarflexion    Ankle inversion    Ankle eversion     (Blank rows = not tested)  LOWER EXTREMITY MMT:    MMT Right Eval Left Eval  Hip flexion    Hip extension    Hip abduction    Hip adduction    Hip internal rotation    Hip external rotation    Knee flexion    Knee extension 5 5  Ankle dorsiflexion 5 5  Ankle plantarflexion    Ankle inversion    Ankle eversion    (Blank rows = not tested)  GAIT: Gait pattern: WFL Distance walked: 19ft Comments: WFL, mild trendelenberg  FUNCTIONAL TESTS:  DGI 1. Gait level surface (3) Normal: Walks 20', no assistive devices, good sped, no evidence for imbalance, normal gait pattern 2. Change in gait speed (3) Normal: Able to smoothly change walking speed without loss of balance or gait deviation. Shows a significant difference in walking speeds between normal, fast and slow speeds. 3. Gait with horizontal head  turns (2) Mild Impairment: Performs head turns smoothly with slight change in gait velocity, i.e., minor disruption to smooth gait path or uses walking aid. 4. Gait with vertical head turns (3) Normal: Performs head turns smoothly with no change in gait. 5. Gait and pivot turn (3) Normal: Pivot turns safely within 3 seconds and stops quickly with no loss of balance. 6. Step over obstacle (3) Normal: Is able to step over the box without changing gait speed, no evidence of imbalance. 7. Step around obstacles (3) Normal: Is able to walk around cones safely without changing gait speed; no evidence of imbalance. 8. Stairs (3) Normal: Alternating feet, no rail.  TOTAL SCORE: 23/24 / 24                                                                                                                               TREATMENT DATE: 11/07/2023 PT Evaluation only  SLS balance: WFL Tandem Stance: WFL Basketball chest pass x 5 WFL    PATIENT EDUCATION: Education details: PT Evaluation, education regarding neuro plasticity and continuing to ambulate. Person educated: Patient Education method: Medical illustrator Education comprehension: verbalized understanding  HOME EXERCISE PROGRAM: None  GOALS: Goals reviewed with patient? Yes  SHORT TERM GOALS: Target date: 11/07/2023  Pt will be independent with HEP in order to demonstrate participation  in Physical Therapy POC.  Baseline: Goal status: MET   ASSESSMENT:  CLINICAL IMPRESSION: Patient is a 68 y.o. male who was seen today for physical therapy evaluation and treatment for G08 (ICD-10-CM) - Intracranial and intraspinal phlebitis and thrombophlebitis. S/p SAH and thrombolytic CVA. Pt with no major functional mobility, balance deficits at this time. Pt is WFL, safe, high level balance skills, able to pass basketball back and forth. One time evaluation only. No skilled PT services are indicated. Educated pt and son on continuing  level of activity at current level and try various coordination tasks.  OBJECTIVE IMPAIRMENTS:  None .    EVALUATION COMPLEXITY: Low  PLAN:  PT FREQUENCY: one time visit  PT DURATION: other: one time visit  PLANNED INTERVENTIONS: 45409- PT Re-evaluation  PLAN FOR NEXT SESSION: One time visit  Elie Goody, DPT [facility] [place] [phone] office   Nelida Meuse, PT 11/07/2023, 9:52 AM

## 2023-11-07 NOTE — Therapy (Signed)
 OUTPATIENT SPEECH LANGUAGE PATHOLOGY APHASIA TREATMENT   Patient Name: Howard Johnson MRN: [id number] DOB:1955/11/09, 67 y.o., male Today's Date: 11/07/2023  PCP: Howard Stabile, MD REFERRING PROVIDER: Jacquelynn Cree, PA-C  END OF SESSION:  End of Session - 11/07/23 0855     Visit Number 6    Number of Visits 17    Date for SLP Re-Evaluation 12/19/23    Authorization Type [employer]   eff: 08/14/23 oop: 3400 copay 5 no auth spoke with Howard Johnson t ref# [id number]   SLP Start Time 0845    SLP Stop Time  0930    SLP Time Calculation (min) 45 min    Activity Tolerance Patient tolerated treatment well             Past Medical History:  Diagnosis Date   Achilles tendinitis    Left   Diabetes mellitus without complication (HCC)    Elevated PSA    Hyperlipidemia    Hypertension    Right knee pain    Shoulder pain, bilateral    Stroke [facility])    Past Surgical History:  Procedure Laterality Date   TONSILLECTOMY     Patient Active Problem List   Diagnosis Date Noted   Chronic kidney disease 10/10/2023   ICH (intracerebral hemorrhage) (HCC) 10/01/2023   Essential hypertension 10/01/2023   Hyperlipidemia 10/01/2023   DM (diabetes mellitus), type 2 (HCC) 10/01/2023   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 Patient displayed word salad, tachycardia and indications of acute stroke event.  Head CT did identify a small foci of hemorrhage in left temporal lobe. Brain MRI revealed subarachnoid hemorrhage with concerns of dural venous sinus thrombosis. CTA was negative for large venous obstruction. CT venogram was performed and confirmed dural venous sinus thrombosis which was exclusive to nearly occlusive throughout left transverse and sigmoid sinus extending into left upper internal jugular and progression of patchy temporal hematoma. Patient continued to have significant and profound global aphasia with some early reports of possible hallucinations. EEG showed moderate to severe diffuse slowing indicating global cerebral dysfunction but no seizures noted. Follow-up CT showed stable hemorrhage. Pt was at CIR from 10/02/23-10/08/23 and was discharged home with family.  PAIN:  Are you having pain? No  FALLS: Has patient fallen in last 6 months?  No  LIVING ENVIRONMENT: Lives with: lives with their family Lives in: House/apartment  PLOF:  Level of assistance: Independent with ADLs, Independent with IADLs Employment: Retired  PATIENT GOALS: Improve speech  PREVIOUS TREATMENT: Pt was accompanied to today's appointment by his wife, Howard Johnson. Both report continued improvement in expressive speech. He self reflected that he has more difficulty when he has to switch topics in a conversation. He participated in a conversation regarding his job with the [employer]. He was asked to share job duties and initially started listing words: administration, Production designer, theatre/television/film, checks, payroll, reports, three people. With some guidance, he presented a typical day/routine. He currently does not have a personal cell  phone, as he had to return his to his work, but family is working on getting him one. He was asked to write short messages to dictation to simulate a message from a medical practice regarding appointments. He was encouraged to ask for repetition if needed and to share with the communication  partner what he needs (slow down, etc). He was able to write 75% of the important details down after 1-2 readings. He orally read a short paragraph with ~95% acc with allowance for self corrections. Pt stated that he would like to be able to improve spelling and sending emails/texts to friends, so we will target this going forward and adjust goals for more appointments.                                                                                                                   TREATMENT DATE: 11/07/23  Pt accompanied to therapy by his wife today. In session, he completed moderately complex reading comprehension task with 100% acc. He orally read short paragraphs aloud with 95%+ accuracy. Pt stated that he wanted to be able to write checks again, so this was targeted in session. Pt wrote a check and balanced the total with 95% accuracy with the only error in writing the month as "Feb" instead of "March". He answered oral comprehension questions regarding a paraph length story with 100% acc. During story re-tell task, he completed with 95% acc with cues for two words (recipe and roast). SLP provided cues for Pt to attempt to write the response, gesture, and divert attention from task and then return to task. Pt continues to make excellent progress and goals will be updated next session.  PATIENT EDUCATION: Education details: Provided naming to description and moderately complex divergent naming task for HEP Person educated: Patient and Child(ren) Education method: Explanation and Handouts Education comprehension: verbalized understanding   GOALS: Goals reviewed with patient? Yes  SHORT TERM GOALS: Target date: 11/21/2023  Pt will increase naming of common objects/pictures to 90% acc when provided with mod multimodality cues Baseline: 50% max cues Goal status: ONGOING  2.  Pt will complete functional check writing and balancing tasks with 100% acc with min assist and use of compensatory  strategies as needed. Baseline: writes single words with 50% acc Goal status: ONGOING  3.  Pt will implement word-finding strategies with 80% accuracy when unable to verbalize desired word in conversation/functional tasks with mi/mod assist. Baseline: 75% mod assist Goal status: ONGOING  4.  Pt will describe objects and pictures by providing at least three salient features as judged by clinician with 90% acc when provided mi/mod cues.  Baseline:  Goal status: ONGOING  5.  Pt will complete reading comprehension tasks (1-2 sentences) with 80% acc and min assist via multiple choice response. Baseline: varies from 20-50% Goal status: ONGOING  6.  Pt will be able to write short grocery lists, phone messages with 80% acc and min cues. Baseline: 60% mod cues Goal status: ONGOING  LONG TERM GOALS:  Target date: February 11, 2024  Pt will communicate moderate level wants/needs/thoughts to family and friends with use of multimodality communication strategies as needed.   Baseline: Mod assist Goal status: ONGOING  ASSESSMENT:  CLINICAL IMPRESSION: (from evaluation 10/17/2023) Patient is a 68 y.o. male who was seen today for a speech/language evaluation s/p recent CVA. He presents with fluent aphasia with some suspicion for apraxia and moderate receptive deficits. Pt has made significant improvement since he was discharged home, however he continues to be unaware of word substitutions in connected speech. He scored 8/50 on the expressive portion of the MAST and 16/50 on the receptive portion, however functionally, he is able to follow along and participate in short high interest conversations with use of total communication strategies (written cues from SLP to guide conversation, gestures, drawing, and real words). He was unable to name common objects without a phonemic cue and tended to perseverate on numbers at times. He demonstrated relative strengths in auditory comprehension for single words, basic  yes/no questions, and object recognition. He was able to repeat single words 75% of the time (he was unable to repeat in the hospital). Pt appears slightly impulsive with some attention difficulties, which impact receptive and expressive skills. He is motivated to improve his ability to communicate and has excellent family support.   OBJECTIVE IMPAIRMENTS: include awareness, executive functioning, expressive language, receptive language, aphasia, and apraxia. These impairments are limiting patient from managing medications, managing appointments, managing finances, and effectively communicating at home and in community. Factors affecting potential to achieve goals and functional outcome are ability to learn/carryover information, severity of impairments, and family/community support. Patient will benefit from skilled SLP services to address above impairments and improve overall function.  REHAB POTENTIAL: Excellent  PLAN:  SLP FREQUENCY: 2x/week  SLP DURATION: 8 weeks  PLANNED INTERVENTIONS: Language facilitation, Cueing hierachy, Internal/external aids, Functional tasks, Multimodal communication approach, SLP instruction and feedback, Compensatory strategies, Patient/family education, and [id number] Treatment of speech (30 or 45 min)    Thank you,  Havery Moros, CCC-SLP [phone]  Deon Duer, CCC-SLP 11/07/2023, 8:59 AM

## 2023-11-11 ENCOUNTER — Encounter (HOSPITAL_COMMUNITY): Payer: Self-pay | Admitting: Speech Pathology

## 2023-11-11 ENCOUNTER — Telehealth: Payer: Self-pay

## 2023-11-11 ENCOUNTER — Ambulatory Visit (HOSPITAL_COMMUNITY): Admitting: Speech Pathology

## 2023-11-11 DIAGNOSIS — R4701 Aphasia: Secondary | ICD-10-CM | POA: Diagnosis not present

## 2023-11-11 DIAGNOSIS — I69890 Apraxia following other cerebrovascular disease: Secondary | ICD-10-CM

## 2023-11-11 DIAGNOSIS — R41841 Cognitive communication deficit: Secondary | ICD-10-CM

## 2023-11-11 NOTE — Progress Notes (Signed)
   11/11/2023  Patient ID: Howard Johnson, male   DOB: [birth year], 68 y.o.   MRN: [id number]   Diabetes Management:  Was unable to speak with Howard Johnson today, he was called back into the office and wasn't home to take my call. He's been managing Howard Johnson's diabetes and has been monitoring his blood sugar with [employer] Libre 3 plus sensors. I was able to speak with Howard Johnson's wife today but she is not as involved with his diabetes management as Howard Johnson is. I was unable to get updated CGM data but I left a message for her to speak with Howard Johnson about connecting with [place] via [employer] so we can help monitor his blood sugar and manage his diabetes more efficiently.   She says Howard Johnson has been doing well and they have no concerns today besides where they will be able to pick up Howard Johnson when his next fill is due. I just provided them with a box of Howard Johnson via samples last week so Howard Johnson should have about a 90 day supply unless his dose is adjusted. Encouraged them to reach out to me if they had any additional needs before his next follow up with Francee Piccolo on 5/27. Provided my direct phone number [phone]) and requested they call me back when they're ready to link his [employer] account with ours.   [name], PharmD

## 2023-11-11 NOTE — Therapy (Signed)
 OUTPATIENT SPEECH LANGUAGE PATHOLOGY APHASIA TREATMENT   Patient Name: Howard Johnson MRN: [id number] DOB:May 27, 1956, 68 y.o., male Today's Date: 11/11/2023  PCP: Benita Stabile, MD REFERRING PROVIDER: Jacquelynn Cree, PA-C  END OF SESSION:  End of Session - 11/11/23 0958     Visit Number 7    Number of Visits 17    Date for SLP Re-Evaluation 12/19/23    Authorization Type [employer]   eff: 08/14/23 oop: 3400 copay 5 no auth spoke with erica t ref# [id number]   SLP Start Time 0935    SLP Stop Time  1020    SLP Time Calculation (min) 45 min    Activity Tolerance Patient tolerated treatment well             Past Medical History:  Diagnosis Date   Achilles tendinitis    Left   Diabetes mellitus without complication (HCC)    Elevated PSA    Hyperlipidemia    Hypertension    Right knee pain    Shoulder pain, bilateral    Stroke [facility])    Past Surgical History:  Procedure Laterality Date   TONSILLECTOMY     Patient Active Problem List   Diagnosis Date Noted   Chronic kidney disease 10/10/2023   ICH (intracerebral hemorrhage) (HCC) 10/01/2023   Essential hypertension 10/01/2023   Hyperlipidemia 10/01/2023   DM (diabetes mellitus), type 2 (HCC) 10/01/2023   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 Patient displayed word salad, tachycardia and indications of acute stroke event. Head CT did identify a small foci of hemorrhage in left temporal lobe. Brain MRI revealed subarachnoid hemorrhage with concerns of dural venous sinus thrombosis. CTA was negative for large venous obstruction. CT venogram was performed and confirmed dural venous sinus thrombosis which was exclusive to nearly occlusive throughout left transverse and sigmoid sinus extending into left upper internal jugular and progression of patchy temporal hematoma. Patient continued to have significant and profound global aphasia with some early reports of possible hallucinations. EEG showed moderate to severe diffuse slowing indicating global cerebral dysfunction but no seizures noted. Follow-up CT showed stable hemorrhage. Pt was at CIR from 10/02/23-10/08/23 and was discharged home with family.  PAIN:  Are you having pain? No  FALLS: Has patient fallen in last 6 months?  No  LIVING ENVIRONMENT: Lives with: lives with their family Lives in: House/apartment  PLOF:  Level of assistance: Independent with ADLs, Independent with IADLs Employment: Retired  PATIENT GOALS: Improve speech  PREVIOUS TREATMENT: Pt accompanied to therapy by his wife today. In session, he completed moderately complex reading comprehension task with 100% acc. He orally read short paragraphs aloud with 95%+ accuracy. Pt stated that he wanted to be able to write checks again, so this was targeted in session. Pt wrote a check and balanced the total with 95% accuracy with the only error in writing the month  as "Feb" instead of "March". He answered oral comprehension questions regarding a paraph length story with 100% acc. During story re-tell task, he completed with 95% acc with cues for two words (recipe and roast). SLP provided cues for Pt to attempt to write the response, gesture, and divert attention from task and then return to task. Pt continues to make  excellent progress and goals will be updated next session.                                                                                                                   TREATMENT DATE: 11/11/23 Pt accompanied to therapy by his wife. He completed paragraph length reading comprehension task for HEP with 100% acc and provided a verbal summary to his wife. In session, he completed synonym and antonym task and created sentences for each with 100% acc with min cues for specificity of responses. He was asked to think of synonyms for the word "fight" and cued to use word association strategies to help come up with related words. He was given the cue: "alter" and he stated, "alterment". SLP wrote the word for him to see and he said that he did think that it was a word. SLP then cued for "argument" and "altercation", but he still felt the previous was a word. He described unusual/absurd photos with 100% acc with mi/mod cues for some details and to create full sentences, as he occasionally omits words. He followed 2 step directions with 100% acc and only had difficulty with "before" in multi-step direction. He repeated single words with 100% acc and short sentences with 90% acc. Pt continues to make good progress toward goals.    PATIENT EDUCATION: Education details: Provided moderately complex synonym and antonym task for HEP Person educated: Patient and Child(ren) Education method: Explanation and Handouts Education comprehension: verbalized understanding   GOALS: Goals reviewed with patient? Yes  SHORT TERM GOALS: Target date: 11/21/2023  Pt will increase naming of common objects/pictures to 90% acc when provided with mod multimodality cues Baseline: 50% max cues Goal status: MET Change to: low frequency objects to 90% acc when provided min cues  2.  Pt will complete functional check writing and balancing tasks with 100% acc with min assist and use of compensatory strategies as needed. Baseline:  writes single words with 50% acc Goal status: MET  3.  Pt will implement word-finding strategies with 80% accuracy when unable to verbalize desired word in conversation/functional tasks with mi/mod assist. Baseline: 75% mod assist Goal status: MET Change to: 95% acc with min assist  4.  Pt will describe objects and pictures by providing at least three salient features as judged by clinician with 90% acc when provided mi/mod cues.  Baseline:  Goal status: MET  5.  Pt will complete reading comprehension tasks (1-2 sentences) with 80% acc and min assist via multiple choice response. Baseline: varies from 20-50% Goal status: MET Change to: multiple paragraph length with 100% and indirect cues  6.  Pt will  be able to write short grocery lists, phone messages with 80% acc and min cues. Baseline: 60% mod cues Goal status: MET Change to: 95% acc and min cues  7.  Pt will provide verbal summary of short stories or previous events with 90% acc and min cues. Baseline: 80% mi/mod assist Goal status: NEW   LONG TERM GOALS: Target date: February 11, 2024  Pt will communicate moderate level wants/needs/thoughts to family and friends with use of multimodality communication strategies as needed.   Baseline: Mod assist Goal status: ONGOING  ASSESSMENT:  CLINICAL IMPRESSION: (from evaluation 10/17/2023) Patient is a 68 y.o. male who was seen today for a speech/language evaluation s/p recent CVA. He presents with fluent aphasia with some suspicion for apraxia and moderate receptive deficits. Pt has made significant improvement since he was discharged home, however he continues to be unaware of word substitutions in connected speech. He scored 8/50 on the expressive portion of the MAST and 16/50 on the receptive portion, however functionally, he is able to follow along and participate in short high interest conversations with use of total communication strategies (written cues from SLP to guide conversation,  gestures, drawing, and real words). He was unable to name common objects without a phonemic cue and tended to perseverate on numbers at times. He demonstrated relative strengths in auditory comprehension for single words, basic yes/no questions, and object recognition. He was able to repeat single words 75% of the time (he was unable to repeat in the hospital). Pt appears slightly impulsive with some attention difficulties, which impact receptive and expressive skills. He is motivated to improve his ability to communicate and has excellent family support.   OBJECTIVE IMPAIRMENTS: include awareness, executive functioning, expressive language, receptive language, aphasia, and apraxia. These impairments are limiting patient from managing medications, managing appointments, managing finances, and effectively communicating at home and in community. Factors affecting potential to achieve goals and functional outcome are ability to learn/carryover information, severity of impairments, and family/community support. Patient will benefit from skilled SLP services to address above impairments and improve overall function.  REHAB POTENTIAL: Excellent  PLAN:  SLP FREQUENCY: 2x/week  SLP DURATION: 8 weeks  PLANNED INTERVENTIONS: Language facilitation, Cueing hierachy, Internal/external aids, Functional tasks, Multimodal communication approach, SLP instruction and feedback, Compensatory strategies, Patient/family education, and 11914 Treatment of speech (30 or 45 min)    Thank you,  Havery Moros, CCC-SLP [phone]  Magali Bray, CCC-SLP 11/11/2023, 10:19 AM

## 2023-11-13 ENCOUNTER — Encounter (HOSPITAL_COMMUNITY): Payer: Self-pay | Admitting: Speech Pathology

## 2023-11-13 ENCOUNTER — Ambulatory Visit (HOSPITAL_COMMUNITY): Attending: Physical Medicine and Rehabilitation | Admitting: Speech Pathology

## 2023-11-13 DIAGNOSIS — I69890 Apraxia following other cerebrovascular disease: Secondary | ICD-10-CM | POA: Diagnosis not present

## 2023-11-13 DIAGNOSIS — R41841 Cognitive communication deficit: Secondary | ICD-10-CM | POA: Diagnosis not present

## 2023-11-13 DIAGNOSIS — R4701 Aphasia: Secondary | ICD-10-CM | POA: Diagnosis not present

## 2023-11-13 NOTE — Therapy (Signed)
 OUTPATIENT SPEECH LANGUAGE PATHOLOGY APHASIA TREATMENT   Patient Name: Howard Johnson MRN: [id number] DOB:11-23-1955, 68 y.o., male Today's Date: 11/13/2023  PCP: Howard Stabile, MD REFERRING PROVIDER: Jacquelynn Cree, PA-C  END OF SESSION:  End of Session - 11/13/23 1141     Visit Number 8    Number of Visits 17    Date for SLP Re-Evaluation 01/09/24    Authorization Type [employer]   eff: 08/14/23 oop: 3400 copay 5 no auth spoke with Howard Johnson t ref# [id number]   SLP Start Time 0935    SLP Stop Time  1020    SLP Time Calculation (min) 45 min    Activity Tolerance Patient tolerated treatment well             Past Medical History:  Diagnosis Date   Achilles tendinitis    Left   Diabetes mellitus without complication (HCC)    Elevated PSA    Hyperlipidemia    Hypertension    Right knee pain    Shoulder pain, bilateral    Stroke [facility])    Past Surgical History:  Procedure Laterality Date   TONSILLECTOMY     Patient Active Problem List   Diagnosis Date Noted   Chronic kidney disease 10/10/2023   ICH (intracerebral hemorrhage) (HCC) 10/01/2023   Essential hypertension 10/01/2023   Hyperlipidemia 10/01/2023   DM (diabetes mellitus), type 2 (HCC) 10/01/2023   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 Patient displayed word salad, tachycardia and indications of acute stroke event. Head CT did identify a small foci of hemorrhage in left temporal lobe. Brain MRI revealed subarachnoid hemorrhage with concerns of dural venous sinus thrombosis. CTA was negative for large venous obstruction. CT venogram was performed and confirmed dural venous sinus thrombosis which was exclusive to nearly occlusive throughout left transverse and sigmoid sinus extending into left upper internal jugular and progression of patchy temporal hematoma. Patient continued to have significant and profound global aphasia with some early reports of possible hallucinations. EEG showed moderate to severe diffuse slowing indicating global cerebral dysfunction but no seizures noted. Follow-up CT showed stable hemorrhage. Pt was at [facility] from 10/02/23-10/08/23 and was discharged home with family.  PAIN:  Are you having pain? No  FALLS: Has patient fallen in last 6 months?  No  LIVING ENVIRONMENT: Lives with: lives with their family Lives in: House/apartment  PLOF:  Level of assistance: Independent with ADLs, Independent with IADLs Employment: Retired  PATIENT GOALS: Improve speech  PREVIOUS TREATMENT: Pt accompanied to therapy by his wife. He completed paragraph length reading comprehension task for HEP with 100% acc and provided a verbal summary to his wife. In session, he completed synonym and antonym task and created sentences for each with 100% acc with min cues for specificity of responses. He was asked to think of synonyms for the word "fight" and cued to use word  association strategies to help come up with related words. He was given the cue: "alter" and he stated, "alterment". SLP wrote the word for him to see and he said that he did think that it was a word. SLP then cued for "argument" and "altercation", but he still felt the previous was a word. He described unusual/absurd photos with 100% acc with mi/mod cues for some  details and to create full sentences, as he occasionally omits words. He followed 2 step directions with 100% acc and only had difficulty with "before" in multi-step direction. He repeated single words with 100% acc and short sentences with 90% acc. Pt continues to make good progress toward goals.                                                                                                                    TREATMENT DATE: 11/13/23  Pt accompanied to therapy by his wife. He completed moderately complex HEP of synonym and antonym task with some assist from family and a dictionary. In session, Pt wrote phone messages to dictation and was encouraged to share his communication needs with listener (please speak slowly, repeat that, let me repeat back to you). He was able to write 10-digit phone numbers to dictation without need for repetition with 100% acc. He had more difficulty writing words, for example: Your appointment with Dr. Terrace Johnson is May 1 at [facility]. He wrote a short grocery list to dictation with 100% acc. During verbal expression tasks and spontaneous conversation, Howard Johnson is catching his errors and making attempts at self correction. He was encouraged to email his sister to practice writing. Next session, continue to target verbal expression tasks.  PATIENT EDUCATION: Education details: Provided moderately complex synonym and antonym task for HEP Person educated: Patient and Child(ren) Education method: Explanation and Handouts Education comprehension: verbalized understanding   GOALS: Goals reviewed with patient? Yes  SHORT TERM GOALS: Target date:01/09/2024  Pt will increase naming of common objects/pictures to 90% acc when provided with mod multimodality cues Baseline: 50% max cues Goal status: MET Change to: low frequency objects to 90% acc when provided min cues  2.  Pt will complete functional check writing and balancing tasks with 100% acc with min assist and use of compensatory  strategies as needed. Baseline: writes single words with 50% acc Goal status: MET  3.  Pt will implement word-finding strategies with 80% accuracy when unable to verbalize desired word in conversation/functional tasks with mi/mod assist. Baseline: 75% mod assist Goal status: MET Change to: 95% acc with min assist  4.  Pt will describe objects and pictures by providing at least three salient features as judged by clinician with 90% acc when provided mi/mod cues.  Baseline:  Goal status: MET  5.  Pt will complete reading comprehension tasks (1-2 sentences) with 80% acc and min assist via multiple choice response. Baseline: varies from 20-50% Goal status: MET Change to: multiple paragraph length with 100% and indirect cues  6.  Pt will be  able to write short grocery lists, phone messages with 80% acc and min cues. Baseline: 60% mod cues Goal status: MET Change to: 95% acc and min cues  7.  Pt will provide verbal summary of short stories or previous events with 90% acc and min cues. Baseline: 80% mi/mod assist Goal status: ONGOING   LONG TERM GOALS: Target date: February 11, 2024  Pt will communicate moderate level wants/needs/thoughts to family and friends with use of multimodality communication strategies as needed.   Baseline: Mod assist Goal status: ONGOING  ASSESSMENT:  CLINICAL IMPRESSION: (from evaluation 10/17/2023) Patient is a 68 y.o. male who was seen today for a speech/language evaluation s/p recent CVA. He presents with fluent aphasia with some suspicion for apraxia and moderate receptive deficits. Pt has made significant improvement since he was discharged home, however he continues to be unaware of word substitutions in connected speech. He scored 8/50 on the expressive portion of the MAST and 16/50 on the receptive portion, however functionally, he is able to follow along and participate in short high interest conversations with use of total communication strategies (written  cues from SLP to guide conversation, gestures, drawing, and real words). He was unable to name common objects without a phonemic cue and tended to perseverate on numbers at times. He demonstrated relative strengths in auditory comprehension for single words, basic yes/no questions, and object recognition. He was able to repeat single words 75% of the time (he was unable to repeat in the hospital). Pt appears slightly impulsive with some attention difficulties, which impact receptive and expressive skills. He is motivated to improve his ability to communicate and has excellent family support.   OBJECTIVE IMPAIRMENTS: include awareness, executive functioning, expressive language, receptive language, aphasia, and apraxia. These impairments are limiting patient from managing medications, managing appointments, managing finances, and effectively communicating at home and in community. Factors affecting potential to achieve goals and functional outcome are ability to learn/carryover information, severity of impairments, and family/community support. Patient will benefit from skilled SLP services to address above impairments and improve overall function.  REHAB POTENTIAL: Excellent  PLAN:  SLP FREQUENCY: 2x/week  SLP DURATION: 8 weeks  PLANNED INTERVENTIONS: Language facilitation, Cueing hierachy, Internal/external aids, Functional tasks, Multimodal communication approach, SLP instruction and feedback, Compensatory strategies, Patient/family education, and 16109 Treatment of speech (30 or 45 min)    Thank you,  Havery Moros, CCC-SLP [phone]  Kasson Lamere, CCC-SLP 11/13/2023, 11:46 AM

## 2023-11-18 ENCOUNTER — Ambulatory Visit (HOSPITAL_COMMUNITY): Admitting: Speech Pathology

## 2023-11-18 ENCOUNTER — Encounter (HOSPITAL_COMMUNITY): Payer: Self-pay | Admitting: Speech Pathology

## 2023-11-18 DIAGNOSIS — R41841 Cognitive communication deficit: Secondary | ICD-10-CM

## 2023-11-18 DIAGNOSIS — R4701 Aphasia: Secondary | ICD-10-CM | POA: Diagnosis not present

## 2023-11-18 DIAGNOSIS — I69890 Apraxia following other cerebrovascular disease: Secondary | ICD-10-CM

## 2023-11-18 NOTE — Therapy (Signed)
 OUTPATIENT SPEECH LANGUAGE PATHOLOGY APHASIA TREATMENT   Patient Name: Howard Johnson MRN: [id number] DOB:10-11-1955, 68 y.o., male, male Today's Date: 11/18/2023  PCP: Howard Stabile, MD REFERRING PROVIDER: Jacquelynn Cree, PA-C  END OF SESSION:  End of Session - 11/18/23 1008     Visit Number 9    Number of Visits 17    Date for SLP Re-Evaluation 01/09/24    Authorization Type [employer]   eff: 08/14/23 oop: 3400 copay 5 no auth spoke with Howard Johnson t ref# [id number]   SLP Start Time 0930    SLP Stop Time  1015    SLP Time Calculation (min) 45 min    Activity Tolerance Patient tolerated treatment well             Past Medical History:  Diagnosis Date   Achilles tendinitis    Left   Diabetes mellitus without complication (HCC)    Elevated PSA    Hyperlipidemia    Hypertension    Right knee pain    Shoulder pain, bilateral    Stroke [facility])    Past Surgical History:  Procedure Laterality Date   TONSILLECTOMY     Patient Active Problem List   Diagnosis Date Noted   Chronic kidney disease 10/10/2023   ICH (intracerebral hemorrhage) (HCC) 10/01/2023   Essential hypertension 10/01/2023   Hyperlipidemia 10/01/2023   DM (diabetes mellitus), type 2 (HCC) 10/01/2023   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 Patient displayed word salad, tachycardia and indications of acute stroke event. Head CT  did identify a small foci of hemorrhage in left temporal lobe. Brain MRI revealed subarachnoid hemorrhage with concerns of dural venous sinus thrombosis. CTA was negative for large venous obstruction. CT venogram was performed and confirmed dural venous sinus thrombosis which was exclusive to nearly occlusive throughout left transverse and sigmoid sinus extending into left upper internal jugular and progression of patchy temporal hematoma. Patient continued to have significant and profound global aphasia with some early reports of possible hallucinations. EEG showed moderate to severe diffuse slowing indicating global cerebral dysfunction but no seizures noted. Follow-up CT showed stable hemorrhage. Pt was at [facility] from 10/02/23-10/08/23 and was discharged home with family.  PAIN:  Are you having pain? No  FALLS: Has patient fallen in last 6 months?  No  LIVING ENVIRONMENT: Lives with: lives with their family Lives in: House/apartment  PLOF:  Level of assistance: Independent with ADLs, Independent with IADLs Employment: Retired  PATIENT GOALS: Improve speech  PREVIOUS TREATMENT:  Pt accompanied to therapy by his wife. He completed moderately complex HEP of synonym and antonym task with some assist from family and a dictionary. In session, Pt wrote phone messages to dictation and was encouraged to share his communication needs with listener (please speak slowly, repeat that, let me repeat back to you). He was able to write 10-digit phone numbers to dictation without need for repetition with 100% acc. He had more difficulty writing words, for example: Your appointment with Dr. Terrace Johnson is  May 1 at [facility]. He wrote a short grocery list to dictation with 100% acc. During verbal expression tasks and spontaneous conversation, Howard Johnson is catching his errors and making attempts at self correction. He was encouraged to email his sister to practice writing. Next session, continue to target verbal expression tasks.                                                                                                                    TREATMENT DATE: 11/18/23 Pt accompanied to therapy by his wife. He completed HEP with assist from family as needed to answer "What" questions. In session, he participated in reciprocal conversation regarding his weekend of doing yard work and watching basketball. He no longer skips words that he cannot "find" and instead tries to work through. He continues to experience some dysnomia and benefits from cues to take a break, circumlocute, or use a phonemic cue. He was also encouraged to try and write the word to help elicit production. He communicated his thoughts using appropriate vocabulary with 90% acc and min cues. Next session, have Pt provide a verbal summary of a short story and write sentences to describe photos. He continues to email with family.    PATIENT EDUCATION: Education details: Provided moderately complex synonym and antonym task for HEP Person educated: Patient and Child(ren) Education method: Explanation and Handouts Education comprehension: verbalized understanding   GOALS: Goals reviewed with patient? Yes  SHORT TERM GOALS: Target date:01/09/2024  Pt will increase naming of common objects/pictures to 90% acc when provided with mod multimodality cues Baseline: 50% max cues Goal status: MET Change to: low frequency objects to 90% acc when provided min cues  2.  Pt will complete functional check writing and balancing tasks with 100% acc with min assist and use of compensatory strategies as needed. Baseline: writes single words with 50% acc Goal status: MET  3.  Pt will implement word-finding strategies with 80% accuracy when unable to verbalize desired word in conversation/functional tasks with mi/mod assist. Baseline: 75% mod assist Goal status: MET Change to: 95% acc with min assist  4.  Pt will describe objects and pictures by providing at least three salient  features as judged by clinician with 90% acc when provided mi/mod cues.  Baseline:  Goal status: MET  5.  Pt will complete reading comprehension tasks (1-2 sentences) with 80% acc and min assist via multiple choice response. Baseline: varies from 20-50% Goal status: MET Change to: multiple paragraph length with 100% and indirect cues  6.  Pt will be able to write short grocery lists, phone messages with 80% acc and min cues. Baseline: 60% mod cues Goal status: MET Change to: 95% acc and min cues  7.  Pt will provide verbal summary of short stories or previous events with 90% acc and min cues. Baseline: 80% mi/mod assist Goal status: ONGOING   LONG TERM GOALS: Target date: February 11, 2024  Pt will communicate moderate level wants/needs/thoughts to family and friends with use  of multimodality communication strategies as needed.   Baseline: Mod assist Goal status: ONGOING  ASSESSMENT:  CLINICAL IMPRESSION: (from evaluation 10/17/2023) Patient is a 68 y.o. male who was seen today for a speech/language evaluation s/p recent CVA. He presents with fluent aphasia with some suspicion for apraxia and moderate receptive deficits. Pt has made significant improvement since he was discharged home, however he continues to be unaware of word substitutions in connected speech. He scored 8/50 on the expressive portion of the MAST and 16/50 on the receptive portion, however functionally, he is able to follow along and participate in short high interest conversations with use of total communication strategies (written cues from SLP to guide conversation, gestures, drawing, and real words). He was unable to name common objects without a phonemic cue and tended to perseverate on numbers at times. He demonstrated relative strengths in auditory comprehension for single words, basic yes/no questions, and object recognition. He was able to repeat single words 75% of the time (he was unable to repeat in the hospital). Pt  appears slightly impulsive with some attention difficulties, which impact receptive and expressive skills. He is motivated to improve his ability to communicate and has excellent family support.   OBJECTIVE IMPAIRMENTS: include awareness, executive functioning, expressive language, receptive language, aphasia, and apraxia. These impairments are limiting patient from managing medications, managing appointments, managing finances, and effectively communicating at home and in community. Factors affecting potential to achieve goals and functional outcome are ability to learn/carryover information, severity of impairments, and family/community support. Patient will benefit from skilled SLP services to address above impairments and improve overall function.  REHAB POTENTIAL: Excellent  PLAN:  SLP FREQUENCY: 2x/week  SLP DURATION: 8 weeks  PLANNED INTERVENTIONS: Language facilitation, Cueing hierachy, Internal/external aids, Functional tasks, Multimodal communication approach, SLP instruction and feedback, Compensatory strategies, Patient/family education, and 40981 Treatment of speech (30 or 45 min)    Thank you,  Havery Moros, CCC-SLP [phone]  Eusebio Blazejewski, CCC-SLP 11/18/2023, 10:14 AM

## 2023-11-20 ENCOUNTER — Encounter (HOSPITAL_COMMUNITY): Payer: Self-pay | Admitting: Speech Pathology

## 2023-11-20 ENCOUNTER — Ambulatory Visit (HOSPITAL_COMMUNITY): Admitting: Speech Pathology

## 2023-11-20 DIAGNOSIS — R41841 Cognitive communication deficit: Secondary | ICD-10-CM

## 2023-11-20 DIAGNOSIS — R4701 Aphasia: Secondary | ICD-10-CM

## 2023-11-20 DIAGNOSIS — I69890 Apraxia following other cerebrovascular disease: Secondary | ICD-10-CM

## 2023-11-20 NOTE — Therapy (Signed)
 OUTPATIENT SPEECH LANGUAGE PATHOLOGY APHASIA TREATMENT   Patient Name: Howard Johnson MRN: [id number] DOB:[birth year], 68 y.o., male Today's Date: 11/20/2023  PCP: Benita Stabile, MD REFERRING PROVIDER: Jacquelynn Cree, PA-C  END OF SESSION:  End of Session - 11/20/23 0929     Visit Number 10    Number of Visits 17    Date for SLP Re-Evaluation 01/09/24    Authorization Type [employer]   eff: 08/14/23 oop: 3400 copay 5 no auth spoke with erica t ref# [id number]   SLP Start Time 0930    SLP Stop Time  1015    SLP Time Calculation (min) 45 min    Activity Tolerance Patient tolerated treatment well             Past Medical History:  Diagnosis Date   Achilles tendinitis    Left   Diabetes mellitus without complication (HCC)    Elevated PSA    Hyperlipidemia    Hypertension    Right knee pain    Shoulder pain, bilateral    Stroke [facility])    Past Surgical History:  Procedure Laterality Date   TONSILLECTOMY     Patient Active Problem List   Diagnosis Date Noted   Chronic kidney disease 10/10/2023   ICH (intracerebral hemorrhage) (HCC) 10/01/2023   Essential hypertension 10/01/2023   Hyperlipidemia 10/01/2023   DM (diabetes mellitus), type 2 (HCC) 10/01/2023   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 Patient displayed word salad, tachycardia and indications of acute stroke  event. Head CT did identify a small foci of hemorrhage in left temporal lobe. Brain MRI revealed subarachnoid hemorrhage with concerns of dural venous sinus thrombosis. CTA was negative for large venous obstruction. CT venogram was performed and confirmed dural venous sinus thrombosis which was exclusive to nearly occlusive throughout left transverse and sigmoid sinus extending into left upper internal jugular and progression of patchy temporal hematoma. Patient continued to have significant and profound global aphasia with some early reports of possible hallucinations. EEG showed moderate to severe diffuse slowing indicating global cerebral dysfunction but no seizures noted. Follow-up CT showed stable hemorrhage. Pt was at CIR from 10/02/23-10/08/23 and was discharged home with family.  PAIN:  Are you having pain? No  FALLS: Has patient fallen in last 6 months?  No  LIVING ENVIRONMENT: Lives with: lives with their family Lives in: House/apartment  PLOF:  Level of assistance: Independent with ADLs, Independent with IADLs Employment: Retired  PATIENT GOALS: Improve speech  PREVIOUS TREATMENT:  Pt accompanied to therapy by his wife. He completed HEP with assist from family as needed to answer "What" questions. In session, he participated in reciprocal conversation regarding his weekend of doing yard work and watching basketball. He no longer skips words that he cannot "find" and instead tries to work through. He continues to experience some dysnomia and benefits from cues to take a break, circumlocute, or use a phonemic cue. He was also encouraged to try and write the  word to help elicit production. He communicated his thoughts using appropriate vocabulary with 90% acc and min cues. Next session, have Pt provide a verbal summary of a short story and write sentences to describe photos. He continues to email with family.                                                                                                                     TREATMENT DATE: 11/20/23 Pt was accompanied to therapy by his wife. Session focused on increasing verbal expression skills in conversation. Pt mentioned two television shows he likes to watch, "A Team" and "[facility] it to [facility]" and he was asked to share the premise of each show and name the major characters (as he stated that remembering the names of old friends and actors). He was easily able to verbalize the premise of each show, but had difficulty with naming the characters. He benefited from written and phonemic cues. He was also reminded to divert his attention for a moment (when it is on "the tip of the tongue" and return to task and also to "talk around" or provide associated words to help trigger the word. He also shared that he was trying to remember a name of an old friend after seeing someone who looked just like him on TV and he was unable to think of his first name. He was encouraged to write the last name and associated words in session and we will see if he comes up with the person's first name over the next couple of days. Pt continues to make good progress toward goals. There is a break in our schedule due outpatient SLP caseload, so he was given extensive HEP to continue work outside of therapy. He is also on a cancellation list.   PATIENT EDUCATION: Education details: Provided moderately complex synonym and antonym task for HEP Person educated: Patient and Child(ren) Education method: Explanation and Handouts Education comprehension: verbalized understanding  GOALS: Goals reviewed with patient? Yes  SHORT TERM GOALS: Target date:01/09/2024  Pt will increase naming of common objects/pictures to 90% acc when provided with mod multimodality cues Baseline: 50% max cues Goal status: MET Change to: low frequency objects to 90% acc when provided min cues  2.  Pt will complete functional check writing and balancing tasks with 100% acc with min assist and use  of compensatory strategies as needed. Baseline: writes single words with 50% acc Goal status: MET  3.  Pt will implement word-finding strategies with 80% accuracy when unable to verbalize desired word in conversation/functional tasks with mi/mod assist. Baseline: 75% mod assist Goal status: MET Change to: 95% acc with min assist  4.  Pt will describe objects and pictures by providing at least three salient features as judged by clinician with 90% acc when provided mi/mod cues.  Baseline:  Goal status: MET  5.  Pt will complete reading comprehension tasks (1-2 sentences) with 80% acc and min assist via multiple choice response. Baseline: varies from 20-50% Goal  status: MET Change to: multiple paragraph length with 100% and indirect cues  6.  Pt will be able to write short grocery lists, phone messages with 80% acc and min cues. Baseline: 60% mod cues Goal status: MET Change to: 95% acc and min cues  7.  Pt will provide verbal summary of short stories or previous events with 90% acc and min cues. Baseline: 80% mi/mod assist Goal status: ONGOING   LONG TERM GOALS: Target date: February 11, 2024  Pt will communicate moderate level wants/needs/thoughts to family and friends with use of multimodality communication strategies as needed.   Baseline: Mod assist Goal status: ONGOING  ASSESSMENT:  CLINICAL IMPRESSION: (from evaluation 10/17/2023) Patient is a 68 y.o. male who was seen today for a speech/language evaluation s/p recent CVA. He presents with fluent aphasia with some suspicion for apraxia and moderate receptive deficits. Pt has made significant improvement since he was discharged home, however he continues to be unaware of word substitutions in connected speech. He scored 8/50 on the expressive portion of the MAST and 16/50 on the receptive portion, however functionally, he is able to follow along and participate in short high interest conversations with use of total communication  strategies (written cues from SLP to guide conversation, gestures, drawing, and real words). He was unable to name common objects without a phonemic cue and tended to perseverate on numbers at times. He demonstrated relative strengths in auditory comprehension for single words, basic yes/no questions, and object recognition. He was able to repeat single words 75% of the time (he was unable to repeat in the hospital). Pt appears slightly impulsive with some attention difficulties, which impact receptive and expressive skills. He is motivated to improve his ability to communicate and has excellent family support.   OBJECTIVE IMPAIRMENTS: include awareness, executive functioning, expressive language, receptive language, aphasia, and apraxia. These impairments are limiting patient from managing medications, managing appointments, managing finances, and effectively communicating at home and in community. Factors affecting potential to achieve goals and functional outcome are ability to learn/carryover information, severity of impairments, and family/community support. Patient will benefit from skilled SLP services to address above impairments and improve overall function.  REHAB POTENTIAL: Excellent  PLAN:  SLP FREQUENCY: 2x/week  SLP DURATION: 8 weeks  PLANNED INTERVENTIONS: Language facilitation, Cueing hierachy, Internal/external aids, Functional tasks, Multimodal communication approach, SLP instruction and feedback, Compensatory strategies, Patient/family education, and 47425 Treatment of speech (30 or 45 min)    Thank you,  Havery Moros, CCC-SLP [phone]  Stesha Neyens, CCC-SLP 11/20/2023, 9:30 AM

## 2023-11-24 DIAGNOSIS — E1165 Type 2 diabetes mellitus with hyperglycemia: Secondary | ICD-10-CM | POA: Diagnosis not present

## 2023-12-09 ENCOUNTER — Telehealth: Payer: Self-pay

## 2023-12-09 NOTE — Progress Notes (Signed)
    12/09/2023 Name: Arby Hatt MRN: [id number] DOB: 1956-06-07   Diabetes: Last A1C: 12.4 on 09/30/23 Last GFR 52 on 10/22/23, MACR 59 on 05/14/23  Goal A1C: <8% in 3 months, <7% in 6 months  Was able to talk with Fabio Holts today. He had to go back to work so they transitioned his sensors to a reader instead of his phone so Mardell and his wife could have it available to monitor. Unable to connect with [employer] however [name] provided information. Loys has been in target 97% of the time over the last 2 weeks. He's had two "lows" on the [place] since the last time we talked but when they verified his glucose with test strips he was actually normal, so no true lows. [name] says he's doing remarkably better, has an adequate supply of medication, and has no further needs today.   Assessment: Uncontrolled  -Lantus  - 15 units daily  Medications tried in the past: -Farxiga 10 mg - 1 tablet daily -Metformin  500 mg - 1 tablet daily -Rybelsus 7 mg - 1 tablet daily  Plan: Nevaan is scheduled to see PCP 01/07/24 w/ updated labs. [name] feels everything is under control and considering his time in range is 97% no need for any intervention prior to next PCP visit. Will review labs and outreach if any concerns. If BP uncontrolled at next visit will likely reach out to discuss    Flint Hummer, PharmD

## 2023-12-10 ENCOUNTER — Ambulatory Visit (HOSPITAL_COMMUNITY): Admitting: Speech Pathology

## 2023-12-10 DIAGNOSIS — R4701 Aphasia: Secondary | ICD-10-CM

## 2023-12-10 DIAGNOSIS — R41841 Cognitive communication deficit: Secondary | ICD-10-CM

## 2023-12-10 NOTE — Therapy (Signed)
 OUTPATIENT SPEECH LANGUAGE PATHOLOGY APHASIA TREATMENT   Patient Name: Howard Johnson MRN: [id number] DOB:[birth year], 68 y.o., male Today's Date: 12/10/2023  PCP: Omie Bickers, MD REFERRING PROVIDER: Zelda Hickman, PA-C  END OF SESSION:  End of Session - 12/10/23 1654     Visit Number 11    Number of Visits 17    Date for SLP Re-Evaluation 01/09/24    Authorization Type [employer]   eff: 08/14/23 oop: 3400 copay 5 no auth spoke with erica t ref# [id number]   SLP Start Time 1600    SLP Stop Time  1645    SLP Time Calculation (min) 45 min    Activity Tolerance Patient tolerated treatment well             Past Medical History:  Diagnosis Date   Achilles tendinitis    Left   Diabetes mellitus without complication (HCC)    Elevated PSA    Hyperlipidemia    Hypertension    Right knee pain    Shoulder pain, bilateral    Stroke [facility])    Past Surgical History:  Procedure Laterality Date   TONSILLECTOMY     Patient Active Problem List   Diagnosis Date Noted   Chronic kidney disease 10/10/2023   ICH (intracerebral hemorrhage) (HCC) 10/01/2023   Essential hypertension 10/01/2023   Hyperlipidemia 10/01/2023   DM (diabetes mellitus), type 2 (HCC) 10/01/2023   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 Patient displayed word salad, tachycardia and indications of acute stroke event. Head CT did identify a small foci of hemorrhage  in left temporal lobe. Brain MRI revealed subarachnoid hemorrhage with concerns of dural venous sinus thrombosis. CTA was negative for large venous obstruction. CT venogram was performed and confirmed dural venous sinus thrombosis which was exclusive to nearly occlusive throughout left transverse and sigmoid sinus extending into left upper internal jugular and progression of patchy temporal hematoma. Patient continued to have significant and profound global aphasia with some early reports of possible hallucinations. EEG showed moderate to severe diffuse slowing indicating global cerebral dysfunction but no seizures noted. Follow-up CT showed stable hemorrhage. Pt was at CIR from 10/02/23-10/08/23 and was discharged home with family.  PAIN:  Are you having pain? No  FALLS: Has patient fallen in last 6 months?  No  LIVING ENVIRONMENT: Lives with: lives with their family Lives in: House/apartment  PLOF:  Level of assistance: Independent with ADLs, Independent with IADLs Employment: Retired  PATIENT GOALS: Improve speech  PREVIOUS TREATMENT:   Pt was accompanied to therapy by his wife. Session focused on increasing verbal expression skills in conversation. Pt mentioned two television shows he likes to watch, "A Team" and "Leave it to [facility]" and he was asked to share the premise of each show and name the major characters (as he stated that remembering the names of old friends and actors). He was easily able to verbalize the premise of each show, but had difficulty with naming the characters. He benefited from written and  phonemic cues. He was also reminded to divert his attention for a moment (when it is on "the tip of the tongue" and return to task and also to "talk around" or provide associated words to help trigger the word. He also shared that he was trying to remember a name of an old friend after seeing someone who looked just like him on TV and he was unable to think of his first name. He was  encouraged to write the last name and associated words in session and we will see if he comes up with the person's first name over the next couple of days. Pt continues to make good progress toward goals. There is a break in our schedule due outpatient SLP caseload, so he was given extensive HEP to continue work outside of therapy. He is also on a cancellation list.                                                                                                                    TREATMENT DATE: 12/10/23 Pt reports feeling much closer to his baseline with occasional word finding deficits. The MAST was re-administered this date and he achieved a 50/50 on the expressive portion (initial 8/50) and 48/50 on the receptive portion (initial 16/50) with mild deficits in attention to detail in reading comprehension task. Pt scored a 49/60 on the [employer] with errors for low frequency words, but named with phonemic cue (muzzle, trellis, palette, protractor). Pt met all goals and has resumed all previous aspects of his life except for driving and returning to work. SLP suggested that he return to work mid-week to ease the transition (although he only works 4 hours a day when he works). He will see the neurologist later this week and will discuss driving and returning to work. He is able to express complex thoughts and follow multi-step directions with occasional delays. He is independent with use of word finding strategies to help convey words he is unable to verbalize ("you measure angles with it" for protractor). Recommend D/C from SLP services and Pt/spouse are in agreement with plan of care.   PATIENT EDUCATION: Education details: Discharge from OP SLP sessions and continue to read and provide verbal summaries to family Person educated: Patient and Child(ren) Education method: Explanation and Handouts Education comprehension: verbalized understanding  GOALS: Goals reviewed with patient? Yes  SHORT  TERM GOALS: Target date:01/09/2024  Pt will increase naming of common objects/pictures to 90% acc when provided with mod multimodality cues Baseline: 50% max cues Goal status: MET Change to: low frequency objects to 90% acc when provided min cues; MET  2.  Pt will complete functional check writing and balancing tasks with 100% acc with min assist and use of compensatory strategies as needed. Baseline: writes single words with 50% acc Goal status: MET  3.  Pt will implement word-finding strategies with 80% accuracy when unable to verbalize desired word in conversation/functional tasks with mi/mod assist. Baseline: 75% mod assist Goal status: MET  Change to: 95% acc with min assist; MET  4.  Pt will describe objects and pictures by providing at least three salient features as judged by clinician with 90% acc when provided mi/mod cues.  Baseline:  Goal status: MET  5.  Pt will complete reading comprehension tasks (1-2 sentences) with 80% acc and min assist via multiple choice response. Baseline: varies from 20-50% Goal status: MET Change to: multiple paragraph length with 100% and indirect cues; MET  6.  Pt will be able to write short grocery lists, phone messages with 80% acc and min cues. Baseline: 60% mod cues Goal status: MET Change to: 95% acc and min cues; MET  7.  Pt will provide verbal summary of short stories or previous events with 90% acc and min cues. Baseline: 80% mi/mod assist Goal status: MET   LONG TERM GOALS: Target date: February 11, 2024  Pt will communicate moderate level wants/needs/thoughts to family and friends with use of multimodality communication strategies as needed.   Baseline: Mod assist Goal status: MET  ASSESSMENT:  CLINICAL IMPRESSION: (from evaluation 10/17/2023) Patient is a 68 y.o. male who was seen today for a speech/language evaluation s/p recent CVA. He presents with fluent aphasia with some suspicion for apraxia and moderate receptive deficits. Pt  has made significant improvement since he was discharged home, however he continues to be unaware of word substitutions in connected speech. He scored 8/50 on the expressive portion of the MAST and 16/50 on the receptive portion, however functionally, he is able to follow along and participate in short high interest conversations with use of total communication strategies (written cues from SLP to guide conversation, gestures, drawing, and real words). He was unable to name common objects without a phonemic cue and tended to perseverate on numbers at times. He demonstrated relative strengths in auditory comprehension for single words, basic yes/no questions, and object recognition. He was able to repeat single words 75% of the time (he was unable to repeat in the hospital). Pt appears slightly impulsive with some attention difficulties, which impact receptive and expressive skills. He is motivated to improve his ability to communicate and has excellent family support.   OBJECTIVE IMPAIRMENTS: include awareness, executive functioning, expressive language, receptive language, aphasia, and apraxia. These impairments are limiting patient from managing medications, managing appointments, managing finances, and effectively communicating at home and in community. Factors affecting potential to achieve goals and functional outcome are ability to learn/carryover information, severity of impairments, and family/community support. Patient will benefit from skilled SLP services to address above impairments and improve overall function.  REHAB POTENTIAL: Excellent  PLAN: Discharge from SLP services  PLANNED INTERVENTIONS: Language facilitation, Cueing hierachy, Internal/external aids, Functional tasks, Multimodal communication approach, SLP instruction and feedback, Compensatory strategies, Patient/family education, and [id number] Treatment of speech (30 or 45 min)   SPEECH THERAPY DISCHARGE SUMMARY  Visits from Start of  Care: 11  Current functional level related to goals / functional outcomes: Pt met all goals above   Remaining deficits: Pt presents with very mild expressive deficits (rare dysnomia) and mild reading comprehension deficits   Education / Equipment: HEP completed   Patient agrees to discharge. Patient goals were met. Patient is being discharged due to meeting the stated rehab goals..    Thank you,  Claudetta Cuba, CCC-SLP [phone]  Vivaan Helseth, CCC-SLP 12/10/2023, 4:55 PM

## 2023-12-12 ENCOUNTER — Ambulatory Visit: Payer: HMO | Admitting: Neurology

## 2023-12-12 ENCOUNTER — Encounter: Payer: Self-pay | Admitting: Neurology

## 2023-12-12 ENCOUNTER — Telehealth: Payer: Self-pay | Admitting: Neurology

## 2023-12-12 VITALS — BP 182/90 | HR 81 | Resp 16 | Ht 76.0 in | Wt 216.0 lb

## 2023-12-12 DIAGNOSIS — R292 Abnormal reflex: Secondary | ICD-10-CM

## 2023-12-12 DIAGNOSIS — G08 Intracranial and intraspinal phlebitis and thrombophlebitis: Secondary | ICD-10-CM

## 2023-12-12 DIAGNOSIS — I619 Nontraumatic intracerebral hemorrhage, unspecified: Secondary | ICD-10-CM

## 2023-12-12 NOTE — Telephone Encounter (Signed)
 no auth required sent to [facility] [phone]

## 2023-12-12 NOTE — Progress Notes (Unsigned)
 Chief Complaint  Patient presents with   Hospitalization Follow-up    Rm15, son and wife present, Stroke followup:pt stated physically he feels much better, memory and speech is improving. Recently finished speech therapy and never need physical therapy       ASSESSMENT AND PLAN  Howard Johnson is a 68 y.o. 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 Now on Eliquis  5 mg twice a day Hypercoagulable panel was negative, Did reported a history of COVID infection few weeks prior to symptom onset, risk factor also include poorly controlled diabetes, with A1c up to 13 Will repeat MRI would be without contrast  (abnormal kidney function) end of August, 6 months after the initial event, Literature reviewed, patient needs to be on anticoagulation 6 to 12 months Also generate Johnson for him to go back to work with certain  modification  Return To Clinic in 4 Months   DIAGNOSTIC DATA (LABS, IMAGING, TESTING) - I reviewed patient records, labs, notes, testing and imaging myself where available.   MEDICAL HISTORY:  Howard Johnson, is a 68 year old male seen in request by his primary care doctor Denman Fischer for evaluation of stroke, he is accompanied by his wife and son at today's visit Dec 12, 2023  History is obtained from the patient and review of electronic medical records. I personally reviewed pertinent available imaging films in PACS.   PMHx of  HTN HLD DMx 12 year  He was admitted to hospital on September 26, 2023 after several days of abnormal speech, confusion, this follow-up few weeks history of COVID, whole family suffered respiratory illness for few weeks  MRI of the brain with contrast showed asymmetric decreased creased the flow void in the left transverse and sigmoid sinus with filling defect in the left sigmoid sinus and proximal internal jugular vein, as well as right sigmoid/transverse sinus junction, also subarachnoid  hemorrhage at the left temporal lobe,  CT venogram also confirmed dural venous thrombosis occlusive to near occlusive through the left transverse and sigmoid sinus, extending into the left upper internal jugular vein  Repeat CT head later showed stable left temporal hematoma with some surrounding edema  EEG February 16 showed no epileptiform discharge  Echocardiogram ejection fraction 65 to 70%  Laboratory LDL 54 A1c of 12.8, hypercoagulable panel was negative, creatinine was 1.74 with GFR of 42, normal CBC hemoglobin of 14.5  He was not antithrombotic treatment prior to admission, was discharged with Eliquis  5 mg twice a day  He has made remarkable recovery since discharge, but still with mild mental slowing, 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  PHYSICAL EXAMNIATION:  Gen: NAD, conversant, well nourised, well groomed                     Cardiovascular: Regular rate rhythm, no peripheral edema, warm, nontender. Eyes: Conjunctivae clear without exudates or hemorrhage Neck: Supple, no carotid bruits. Pulmonary: Clear to auscultation bilaterally   NEUROLOGICAL EXAM:  MENTAL STATUS: Speech/cognition: Awake, alert, oriented to history taking and casual conversation  CN II: Visual fields are full to confrontation. Pupils are round  equal and briskly reactive to light. CN III, IV, VI: extraocular movement are normal. No ptosis. CN V: Facial sensation is intact to light touch CN VII: Face is symmetric with normal eye closure  CN VIII: Hearing is normal to causal conversation. CN IX, X: Phonation is normal. CN XI: Head turning and shoulder shrug are intact  MOTOR: There is no pronator drift of out-stretched arms. Muscle bulk  and tone are normal. Muscle strength is normal.  REFLEXES: Reflexes are 2+ and symmetric at the biceps, triceps, knees, and ankles. Plantar responses are flexor.  SENSORY: Intact to light touch, pinprick and vibratory sensation are intact in fingers and toes.  COORDINATION: There is no trunk or limb dysmetria noted.  GAIT/STANCE: Posture is normal. Gait is steady    REVIEW OF SYSTEMS:  Full 14 system review of systems performed and notable only for as above All other review of systems were negative.   ALLERGIES: Allergies  Allergen Reactions   Latex    Tape Rash    HOME MEDICATIONS: Current Outpatient Medications  Medication Sig Dispense Refill   apixaban  (ELIQUIS ) 5 MG TABS tablet Take 1 tablet (5 mg total) by mouth 2 (two) times daily. 60 tablet 0   fluticasone  (FLONASE ) 50 MCG/ACT nasal spray Place 2 sprays into both nostrils daily. 16 g 0   insulin  glargine (LANTUS ) 100 UNIT/ML Solostar Pen Inject 20 Units into the skin daily. (Patient taking differently: Inject 15 Units into the skin daily.) 15 mL 0   Insulin  Pen Needle 32G X 4 MM MISC Use as directed with insulin  100 each 0   loratadine  (CLARITIN ) 10 MG tablet Take 1 tablet (10 mg total) by mouth daily. 30 tablet 0   rosuvastatin  (CRESTOR ) 10 MG tablet Take 1 tablet (10 mg total) by mouth daily with supper. 30 tablet 0   No current facility-administered medications for this visit.    PAST MEDICAL HISTORY: Past Medical History:  Diagnosis Date   Achilles tendinitis    Left   Diabetes mellitus without complication (HCC)    Elevated PSA    Hyperlipidemia    Hypertension    Right knee pain    Shoulder pain, bilateral    Stroke (HCC)     PAST SURGICAL HISTORY: Past Surgical History:  Procedure Laterality Date   TONSILLECTOMY      FAMILY HISTORY: Family History  Problem Relation Age of Onset   Dementia Mother    Lung cancer Father    Diabetes Sister     SOCIAL HISTORY: Social History    Socioeconomic History   Marital status: Married    Spouse name: margaret   Number of children: 1   Years of education: Not on file   Highest education level: Bachelor's degree (e.g., BA, AB, BS)  Occupational History   Not on file  Tobacco Use   Smoking status: Never    Passive exposure: Never   Smokeless tobacco: Never  Vaping Use   Vaping status: Never Used  Substance and Sexual Activity   Alcohol use: Never   Drug use: Never   Sexual activity: Yes    Birth control/protection: None  Other Topics Concern   Not on file  Social History Narrative   Son Howard Johnson    Social Drivers of Health   Financial Resource Strain: Not on file  Food Insecurity: Not on file  Transportation Needs: Not on file  Physical Activity: Not on file  Stress: Not on file  Social Connections: Not on file  Intimate Partner Violence: Not on file      Phebe Brasil, M.D. Ph.D.  [facility] [address] [place], [place] [address] Ph: [phone] Fax: [phone]  CC:  Laymond Priestly, NP [address] [place],  [place] [address]  Omie Bickers, MD

## 2023-12-17 ENCOUNTER — Encounter (HOSPITAL_COMMUNITY): Admitting: Speech Pathology

## 2023-12-19 ENCOUNTER — Encounter (HOSPITAL_COMMUNITY): Admitting: Speech Pathology

## 2023-12-24 ENCOUNTER — Encounter (HOSPITAL_COMMUNITY): Admitting: Speech Pathology

## 2023-12-24 DIAGNOSIS — E1165 Type 2 diabetes mellitus with hyperglycemia: Secondary | ICD-10-CM | POA: Diagnosis not present

## 2023-12-26 ENCOUNTER — Encounter (HOSPITAL_COMMUNITY): Admitting: Speech Pathology

## 2024-01-01 DIAGNOSIS — Z125 Encounter for screening for malignant neoplasm of prostate: Secondary | ICD-10-CM | POA: Diagnosis not present

## 2024-01-01 DIAGNOSIS — I10 Essential (primary) hypertension: Secondary | ICD-10-CM | POA: Diagnosis not present

## 2024-01-01 DIAGNOSIS — E1165 Type 2 diabetes mellitus with hyperglycemia: Secondary | ICD-10-CM | POA: Diagnosis not present

## 2024-01-07 DIAGNOSIS — N183 Chronic kidney disease, stage 3 unspecified: Secondary | ICD-10-CM | POA: Diagnosis not present

## 2024-01-07 DIAGNOSIS — R809 Proteinuria, unspecified: Secondary | ICD-10-CM | POA: Diagnosis not present

## 2024-01-07 DIAGNOSIS — M7662 Achilles tendinitis, left leg: Secondary | ICD-10-CM | POA: Diagnosis not present

## 2024-01-07 DIAGNOSIS — R590 Localized enlarged lymph nodes: Secondary | ICD-10-CM | POA: Diagnosis not present

## 2024-01-07 DIAGNOSIS — I10 Essential (primary) hypertension: Secondary | ICD-10-CM | POA: Diagnosis not present

## 2024-01-07 DIAGNOSIS — I609 Nontraumatic subarachnoid hemorrhage, unspecified: Secondary | ICD-10-CM | POA: Diagnosis not present

## 2024-01-07 DIAGNOSIS — R130 Aphagia: Secondary | ICD-10-CM | POA: Diagnosis not present

## 2024-01-07 DIAGNOSIS — E785 Hyperlipidemia, unspecified: Secondary | ICD-10-CM | POA: Diagnosis not present

## 2024-01-07 DIAGNOSIS — E1165 Type 2 diabetes mellitus with hyperglycemia: Secondary | ICD-10-CM | POA: Diagnosis not present

## 2024-01-07 DIAGNOSIS — Z8673 Personal history of transient ischemic attack (TIA), and cerebral infarction without residual deficits: Secondary | ICD-10-CM | POA: Diagnosis not present

## 2024-01-07 DIAGNOSIS — M25511 Pain in right shoulder: Secondary | ICD-10-CM | POA: Diagnosis not present

## 2024-01-07 DIAGNOSIS — Z5320 Procedure and treatment not carried out because of patient's decision for unspecified reasons: Secondary | ICD-10-CM | POA: Diagnosis not present

## 2024-01-21 ENCOUNTER — Ambulatory Visit
Admission: RE | Admit: 2024-01-21 | Discharge: 2024-01-21 | Disposition: A | Discharge status: Home / Self Care | Source: Ambulatory Visit | Attending: Neurology | Admitting: Neurology

## 2024-01-21 DIAGNOSIS — E119 Type 2 diabetes mellitus without complications: Secondary | ICD-10-CM | POA: Diagnosis not present

## 2024-01-21 DIAGNOSIS — G08 Intracranial and intraspinal phlebitis and thrombophlebitis: Secondary | ICD-10-CM | POA: Diagnosis not present

## 2024-01-21 DIAGNOSIS — I10 Essential (primary) hypertension: Secondary | ICD-10-CM | POA: Diagnosis not present

## 2024-01-23 ENCOUNTER — Ambulatory Visit: Payer: Self-pay | Admitting: Neurology

## 2024-01-23 ENCOUNTER — Telehealth: Payer: Self-pay | Admitting: Neurology

## 2024-01-23 DIAGNOSIS — G08 Intracranial and intraspinal phlebitis and thrombophlebitis: Secondary | ICD-10-CM

## 2024-01-23 DIAGNOSIS — D6859 Other primary thrombophilia: Secondary | ICD-10-CM | POA: Insufficient documentation

## 2024-01-23 NOTE — Telephone Encounter (Signed)
 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  I have ordered repeat laboratory evaluation to recheck the level.  You do not need appointment, you can come in during regular working hours to have lab drawn.  Orders Placed This Encounter  Procedures   Antithrombin panel

## 2024-01-23 NOTE — Telephone Encounter (Signed)
 Pt wife aware

## 2024-01-24 DIAGNOSIS — E1165 Type 2 diabetes mellitus with hyperglycemia: Secondary | ICD-10-CM | POA: Diagnosis not present

## 2024-01-30 ENCOUNTER — Other Ambulatory Visit (INDEPENDENT_AMBULATORY_CARE_PROVIDER_SITE_OTHER): Payer: Self-pay

## 2024-01-30 DIAGNOSIS — Z0289 Encounter for other administrative examinations: Secondary | ICD-10-CM

## 2024-01-30 DIAGNOSIS — G08 Intracranial and intraspinal phlebitis and thrombophlebitis: Secondary | ICD-10-CM

## 2024-01-31 LAB — ANTITHROMBIN PANEL
AT III AG PPP IMM-ACNC: 87 % (ref 72–124)
AntiThromb III Func: 138 % — ABNORMAL HIGH (ref 75–135)

## 2024-02-03 ENCOUNTER — Ambulatory Visit: Payer: Self-pay | Admitting: Neurology

## 2024-02-23 DIAGNOSIS — E1165 Type 2 diabetes mellitus with hyperglycemia: Secondary | ICD-10-CM | POA: Diagnosis not present

## 2024-03-25 DIAGNOSIS — E1165 Type 2 diabetes mellitus with hyperglycemia: Secondary | ICD-10-CM | POA: Diagnosis not present

## 2024-04-08 ENCOUNTER — Ambulatory Visit: Admitting: Physical Medicine and Rehabilitation

## 2024-04-25 DIAGNOSIS — E1165 Type 2 diabetes mellitus with hyperglycemia: Secondary | ICD-10-CM | POA: Diagnosis not present

## 2024-04-27 ENCOUNTER — Ambulatory Visit: Admitting: Neurology

## 2024-04-27 VITALS — BP 165/95 | Ht 76.0 in | Wt 215.0 lb

## 2024-04-27 DIAGNOSIS — G08 Intracranial and intraspinal phlebitis and thrombophlebitis: Secondary | ICD-10-CM | POA: Diagnosis not present

## 2024-04-27 DIAGNOSIS — I629 Nontraumatic intracranial hemorrhage, unspecified: Secondary | ICD-10-CM | POA: Diagnosis not present

## 2024-04-27 NOTE — Progress Notes (Addendum)
 Chief Complaint  Patient presents with   Follow-up    RM 15 son in room      ASSESSMENT AND PLAN  Howard Johnson is a 68 y.o. 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 Now on Eliquis  5 mg twice a day Hypercoagulable panel initially showed decreased Antithrombin III , on repeat test it was elevated, he was already taking Eliquis  by then, which can lead to spuriously elevated Antithrombin level Did reported a history of COVID infection few weeks prior to symptom onset, risk factor also include poorly controlled diabetes, with A1c up to 13 We decided to complete adequate treatment for at least 12 months, Will repeat MRI brain without contrast  (abnormal kidney function) in February 2026, he will call when the time for MRI, may consider repeat Antithrombin III  level when he is off Eliquis     Return To Clinic in 6 months  DIAGNOSTIC DATA (LABS, IMAGING, TESTING) - I reviewed patient records, labs, notes, testing and imaging myself where available. Lab in October 2025 A1c 6.1, LDL 58, HDL 39, creatinine 1.71, eGFR 43, hemoglobin of 16.6  MEDICAL HISTORY:  Howard Johnson, is a 68 year old male seen in request by his primary care doctor Shona Rush for evaluation of stroke, he is accompanied by his wife and son at today's visit Dec 12, 2023  History is obtained from the patient and review of electronic medical records. I personally reviewed pertinent available imaging films in PACS.   PMHx of  HTN HLD DMx 12 year  He was admitted to hospital on September 26, 2023 after several days of abnormal speech, confusion, this follow-up few weeks history of COVID, whole family suffered respiratory illness for few weeks  MRI of the brain with contrast showed asymmetric decreased creased the flow void in the left transverse and sigmoid sinus with filling defect in the left sigmoid sinus and proximal internal jugular vein, as well as right  sigmoid/transverse sinus junction, also subarachnoid hemorrhage at the left temporal lobe,  CT venogram also confirmed dural venous thrombosis occlusive to near occlusive through the left transverse and sigmoid sinus, extending into the left upper internal jugular vein  Repeat CT head later showed stable left temporal hematoma with some surrounding edema  EEG February 16 showed no epileptiform discharge  Echocardiogram ejection fraction 65 to 70%  Laboratory LDL 54 A1c of 12.8, hypercoagulable panel was negative, creatinine was 1.74 with GFR of 42, normal CBC hemoglobin of 14.5  He was not antithrombotic treatment prior to admission, was discharged with Eliquis  5 mg twice a day  He has made remarkable recovery since discharge, but still with mild mental slowing, 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, 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,  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: He is accompanied by his son at today's visit, overall doing much better, 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, 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, 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, has no problem handling his job, no headache, no seizure-like activity, driving without problem, only occasionally word finding difficulties when he is not sleeping well,   We again reviewed previous MRI of the brain, in February 2025, evidence of left temporal subarachnoid hemorrhage, left transverse sinus thrombosis  Initial laboratory evaluation showed decreased anti-thrombin 3, but repeat lab test in June showed mild elevation of Antithrombin III , 138, the blood test was taking 1 he is already taking Eliquis , which can elevate Antithrombin activity, possibly masking deficiency  Literature reviewed, patient with intracranial deep venous thrombosis, needs to be on  anticoagulation for 6 to 12 months, both his primary care and I agreed him to take Eliquis  until February 2026,  Will repeat MRI of the brain with without contrast and potentially repeat Antithrombin III  while he is off Eliquis ,  PHYSICAL EXAM:    Vitals:   04/27/24 1308  BP: (!) 165/95  Weight: 215 lb (97.5 kg)  Height: 6' 4 (1.93 m)     Body mass index is 26.17 kg/m.  PHYSICAL EXAMNIATION:  Gen: NAD, conversant, well nourised, well groomed                     Cardiovascular: Regular rate rhythm, no peripheral edema, warm, nontender. Eyes: Conjunctivae clear without exudates or hemorrhage Neck: Supple, no carotid bruits. Pulmonary: Clear to auscultation bilaterally   NEUROLOGICAL EXAM:  MENTAL STATUS: Speech/cognition: Awake, alert, oriented to history taking and casual conversation  CN II: Visual fields are full to confrontation. Pupils are round equal and briskly reactive to light. CN III, IV, VI: extraocular movement are normal. No ptosis. CN V: Facial sensation is intact to light touch CN VII: Face is symmetric with normal eye closure  CN VIII: Hearing is normal to causal conversation. CN IX, X: Phonation is normal. CN XI: Head turning and shoulder shrug are intact  MOTOR: There is no pronator drift of out-stretched arms. Muscle bulk and tone are normal. Muscle strength is normal.  REFLEXES: Reflexes are 2+ and symmetric at the biceps, triceps, knees, and ankles. Plantar responses are flexor.  SENSORY: Intact to light touch, pinprick and vibratory sensation are intact in fingers and toes.  COORDINATION: There is no trunk or limb dysmetria noted.  GAIT/STANCE: Posture is normal. Gait is steady    REVIEW OF SYSTEMS:  Full 14 system review of systems performed and notable only for as above All other review of systems were negative.   ALLERGIES: Allergies  Allergen Reactions   Latex    Tape Rash    HOME MEDICATIONS: Current Outpatient Medications  Medication Sig Dispense Refill   apixaban  (ELIQUIS ) 5 MG TABS tablet Take 1 tablet (5 mg total) by mouth 2 (two) times daily. 60 tablet 0   fluticasone  (FLONASE ) 50 MCG/ACT nasal spray Place 2 sprays into both nostrils daily. 16 g 0   insulin   glargine (LANTUS ) 100 UNIT/ML Solostar Pen Inject 20 Units into the skin daily. (Patient taking differently: Inject 15 Units into the skin daily.) 15 mL 0   Insulin  Pen Needle 32G X 4 MM MISC Use as directed with insulin  100 each 0   lisinopril  (ZESTRIL ) 5 MG tablet Take 5 mg by mouth daily.     loratadine  (CLARITIN ) 10 MG tablet Take 1 tablet (10 mg total) by mouth daily. 30 tablet 0   rosuvastatin  (CRESTOR ) 10 MG tablet Take 1 tablet (10 mg total) by mouth daily with supper. 30 tablet 0   No current facility-administered medications for this visit.    PAST MEDICAL HISTORY: Past Medical History:  Diagnosis Date   Achilles tendinitis    Left   Diabetes mellitus without complication (HCC)    Elevated PSA    Hyperlipidemia    Hypertension    Right knee pain    Shoulder pain, bilateral    Stroke (HCC)     PAST SURGICAL HISTORY: Past Surgical History:  Procedure Laterality Date   TONSILLECTOMY      FAMILY HISTORY: Family History  Problem Relation Age of Onset   Dementia Mother    Lung cancer Father  Diabetes Sister     SOCIAL HISTORY: Social History   Socioeconomic History   Marital status: Married    Spouse name: margaret   Number of children: 1   Years of education: Not on file   Highest education level: Bachelor's degree (e.g., BA, AB, BS)  Occupational History   Not on file  Tobacco Use   Smoking status: Never    Passive exposure: Never   Smokeless tobacco: Never  Vaping Use   Vaping status: Never Used  Substance and Sexual Activity   Alcohol use: Never   Drug use: Never   Sexual activity: Yes    Birth control/protection: None  Other Topics Concern   Not on file  Social History Narrative   Son [name] Genera    Social Drivers of Health   Financial Resource Strain: Not on file  Food Insecurity: Not on file  Transportation Needs: Not on file  Physical Activity: Not on file  Stress: Not on file  Social Connections: Not on file  Intimate Partner  Violence: Not on file      Modena Callander, M.D. Ph.D.  [facility] [address], [address] [place], [place] [address] Ph: [phone] Fax: [phone]  CC:  Shona Norleen PEDLAR, MD [address] [place],  [place] [address]  Shona Norleen PEDLAR, MD

## 2024-05-25 DIAGNOSIS — E1165 Type 2 diabetes mellitus with hyperglycemia: Secondary | ICD-10-CM | POA: Diagnosis not present

## 2024-06-02 DIAGNOSIS — E1165 Type 2 diabetes mellitus with hyperglycemia: Secondary | ICD-10-CM | POA: Diagnosis not present

## 2024-06-02 DIAGNOSIS — Z8673 Personal history of transient ischemic attack (TIA), and cerebral infarction without residual deficits: Secondary | ICD-10-CM | POA: Diagnosis not present

## 2024-06-02 DIAGNOSIS — I10 Essential (primary) hypertension: Secondary | ICD-10-CM | POA: Diagnosis not present

## 2024-06-04 DIAGNOSIS — Z0001 Encounter for general adult medical examination with abnormal findings: Secondary | ICD-10-CM | POA: Diagnosis not present

## 2024-06-04 DIAGNOSIS — M25511 Pain in right shoulder: Secondary | ICD-10-CM | POA: Diagnosis not present

## 2024-06-04 DIAGNOSIS — Z0000 Encounter for general adult medical examination without abnormal findings: Secondary | ICD-10-CM | POA: Diagnosis not present

## 2024-06-04 DIAGNOSIS — I10 Essential (primary) hypertension: Secondary | ICD-10-CM | POA: Diagnosis not present

## 2024-06-04 DIAGNOSIS — Z23 Encounter for immunization: Secondary | ICD-10-CM | POA: Diagnosis not present

## 2024-06-04 DIAGNOSIS — E1165 Type 2 diabetes mellitus with hyperglycemia: Secondary | ICD-10-CM | POA: Diagnosis not present

## 2024-06-04 DIAGNOSIS — E785 Hyperlipidemia, unspecified: Secondary | ICD-10-CM | POA: Diagnosis not present

## 2024-06-04 DIAGNOSIS — M7662 Achilles tendinitis, left leg: Secondary | ICD-10-CM | POA: Diagnosis not present

## 2024-06-04 DIAGNOSIS — R809 Proteinuria, unspecified: Secondary | ICD-10-CM | POA: Diagnosis not present

## 2024-06-04 DIAGNOSIS — M25512 Pain in left shoulder: Secondary | ICD-10-CM | POA: Diagnosis not present

## 2024-06-04 DIAGNOSIS — Z8679 Personal history of other diseases of the circulatory system: Secondary | ICD-10-CM | POA: Diagnosis not present

## 2024-06-04 DIAGNOSIS — J302 Other seasonal allergic rhinitis: Secondary | ICD-10-CM | POA: Diagnosis not present

## 2024-06-25 DIAGNOSIS — E1165 Type 2 diabetes mellitus with hyperglycemia: Secondary | ICD-10-CM | POA: Diagnosis not present

## 2024-07-27 ENCOUNTER — Emergency Department (HOSPITAL_COMMUNITY)

## 2024-07-27 ENCOUNTER — Encounter (HOSPITAL_COMMUNITY): Payer: Self-pay | Admitting: Emergency Medicine

## 2024-07-27 ENCOUNTER — Inpatient Hospital Stay (HOSPITAL_COMMUNITY)
Admission: EM | Admit: 2024-07-27 | Discharge: 2024-08-05 | DRG: 057 | Disposition: A | Discharge status: Home / Self Care | Attending: Internal Medicine | Admitting: Internal Medicine

## 2024-07-27 ENCOUNTER — Inpatient Hospital Stay (HOSPITAL_COMMUNITY)

## 2024-07-27 ENCOUNTER — Other Ambulatory Visit: Payer: Self-pay

## 2024-07-27 DIAGNOSIS — R410 Disorientation, unspecified: Secondary | ICD-10-CM | POA: Diagnosis not present

## 2024-07-27 DIAGNOSIS — Z820 Family history of epilepsy and other diseases of the nervous system: Secondary | ICD-10-CM | POA: Diagnosis not present

## 2024-07-27 DIAGNOSIS — E1122 Type 2 diabetes mellitus with diabetic chronic kidney disease: Secondary | ICD-10-CM | POA: Diagnosis present

## 2024-07-27 DIAGNOSIS — G40901 Epilepsy, unspecified, not intractable, with status epilepticus: Principal | ICD-10-CM | POA: Diagnosis present

## 2024-07-27 DIAGNOSIS — I629 Nontraumatic intracranial hemorrhage, unspecified: Secondary | ICD-10-CM

## 2024-07-27 DIAGNOSIS — I69398 Other sequelae of cerebral infarction: Secondary | ICD-10-CM | POA: Diagnosis not present

## 2024-07-27 DIAGNOSIS — I129 Hypertensive chronic kidney disease with stage 1 through stage 4 chronic kidney disease, or unspecified chronic kidney disease: Secondary | ICD-10-CM | POA: Diagnosis present

## 2024-07-27 DIAGNOSIS — Z79899 Other long term (current) drug therapy: Secondary | ICD-10-CM

## 2024-07-27 DIAGNOSIS — Z801 Family history of malignant neoplasm of trachea, bronchus and lung: Secondary | ICD-10-CM | POA: Diagnosis not present

## 2024-07-27 DIAGNOSIS — Z66 Do not resuscitate: Secondary | ICD-10-CM | POA: Diagnosis present

## 2024-07-27 DIAGNOSIS — E785 Hyperlipidemia, unspecified: Secondary | ICD-10-CM | POA: Diagnosis present

## 2024-07-27 DIAGNOSIS — N1832 Chronic kidney disease, stage 3b: Principal | ICD-10-CM

## 2024-07-27 DIAGNOSIS — I10 Essential (primary) hypertension: Secondary | ICD-10-CM | POA: Diagnosis not present

## 2024-07-27 DIAGNOSIS — Z794 Long term (current) use of insulin: Secondary | ICD-10-CM

## 2024-07-27 DIAGNOSIS — N189 Chronic kidney disease, unspecified: Secondary | ICD-10-CM | POA: Diagnosis not present

## 2024-07-27 DIAGNOSIS — R569 Unspecified convulsions: Secondary | ICD-10-CM | POA: Diagnosis not present

## 2024-07-27 DIAGNOSIS — Z888 Allergy status to other drugs, medicaments and biological substances status: Secondary | ICD-10-CM

## 2024-07-27 DIAGNOSIS — Z91040 Latex allergy status: Secondary | ICD-10-CM | POA: Diagnosis not present

## 2024-07-27 DIAGNOSIS — G40909 Epilepsy, unspecified, not intractable, without status epilepticus: Secondary | ICD-10-CM | POA: Diagnosis not present

## 2024-07-27 DIAGNOSIS — R4701 Aphasia: Secondary | ICD-10-CM | POA: Diagnosis present

## 2024-07-27 DIAGNOSIS — G08 Intracranial and intraspinal phlebitis and thrombophlebitis: Secondary | ICD-10-CM

## 2024-07-27 DIAGNOSIS — Z7901 Long term (current) use of anticoagulants: Secondary | ICD-10-CM

## 2024-07-27 DIAGNOSIS — I69298 Other sequelae of other nontraumatic intracranial hemorrhage: Secondary | ICD-10-CM | POA: Diagnosis not present

## 2024-07-27 DIAGNOSIS — E119 Type 2 diabetes mellitus without complications: Secondary | ICD-10-CM

## 2024-07-27 DIAGNOSIS — D6859 Other primary thrombophilia: Secondary | ICD-10-CM | POA: Diagnosis present

## 2024-07-27 DIAGNOSIS — R471 Dysarthria and anarthria: Secondary | ICD-10-CM | POA: Diagnosis present

## 2024-07-27 DIAGNOSIS — R531 Weakness: Secondary | ICD-10-CM

## 2024-07-27 DIAGNOSIS — Z833 Family history of diabetes mellitus: Secondary | ICD-10-CM | POA: Diagnosis not present

## 2024-07-27 DIAGNOSIS — Z8673 Personal history of transient ischemic attack (TIA), and cerebral infarction without residual deficits: Secondary | ICD-10-CM

## 2024-07-27 DIAGNOSIS — N1831 Chronic kidney disease, stage 3a: Secondary | ICD-10-CM | POA: Diagnosis not present

## 2024-07-27 DIAGNOSIS — Z743 Need for continuous supervision: Secondary | ICD-10-CM | POA: Diagnosis not present

## 2024-07-27 LAB — COMPREHENSIVE METABOLIC PANEL WITH GFR
ALT: 21 U/L (ref 0–44)
AST: 24 U/L (ref 15–41)
Albumin: 4.8 g/dL (ref 3.5–5.0)
Alkaline Phosphatase: 96 U/L (ref 38–126)
Anion gap: 14 (ref 5–15)
BUN: 28 mg/dL — ABNORMAL HIGH (ref 8–23)
CO2: 21 mmol/L — ABNORMAL LOW (ref 22–32)
Calcium: 9.2 mg/dL (ref 8.9–10.3)
Chloride: 104 mmol/L (ref 98–111)
Creatinine, Ser: 1.66 mg/dL — ABNORMAL HIGH (ref 0.61–1.24)
GFR, Estimated: 44 mL/min — ABNORMAL LOW (ref >60)
Glucose, Bld: 133 mg/dL — ABNORMAL HIGH (ref 70–99)
Potassium: 4.1 mmol/L (ref 3.5–5.1)
Sodium: 140 mmol/L (ref 135–145)
Total Bilirubin: 0.5 mg/dL (ref 0.0–1.2)
Total Protein: 7.1 g/dL (ref 6.5–8.1)

## 2024-07-27 LAB — DIFFERENTIAL
Abs Immature Granulocytes: 0.02 K/uL (ref 0.00–0.07)
Basophils Absolute: 0.1 K/uL (ref 0.0–0.1)
Basophils Relative: 1 %
Eosinophils Absolute: 0.1 K/uL (ref 0.0–0.5)
Eosinophils Relative: 1 %
Immature Granulocytes: 0 %
Lymphocytes Relative: 7 %
Lymphs Abs: 0.7 K/uL (ref 0.7–4.0)
Monocytes Absolute: 0.6 K/uL (ref 0.1–1.0)
Monocytes Relative: 6 %
Neutro Abs: 8.4 K/uL — ABNORMAL HIGH (ref 1.7–7.7)
Neutrophils Relative %: 85 %

## 2024-07-27 LAB — CBC
HCT: 52.1 % — ABNORMAL HIGH (ref 39.0–52.0)
Hemoglobin: 16.7 g/dL (ref 13.0–17.0)
MCH: 27.9 pg (ref 26.0–34.0)
MCHC: 32.1 g/dL (ref 30.0–36.0)
MCV: 87 fL (ref 80.0–100.0)
Platelets: 379 K/uL (ref 150–400)
RBC: 5.99 MIL/uL — ABNORMAL HIGH (ref 4.22–5.81)
RDW: 13.8 % (ref 11.5–15.5)
WBC: 10 K/uL (ref 4.0–10.5)
nRBC: 0 % (ref 0.0–0.2)

## 2024-07-27 LAB — URINE DRUG SCREEN
Amphetamines: NEGATIVE
Barbiturates: NEGATIVE
Benzodiazepines: NEGATIVE
Cocaine: NEGATIVE
Fentanyl: NEGATIVE
Methadone Scn, Ur: NEGATIVE
Opiates: NEGATIVE
Tetrahydrocannabinol: NEGATIVE

## 2024-07-27 LAB — CBG MONITORING, ED: Glucose-Capillary: 133 mg/dL — ABNORMAL HIGH (ref 70–99)

## 2024-07-27 LAB — I-STAT CHEM 8, ED
BUN: 31 mg/dL — ABNORMAL HIGH (ref 8–23)
Calcium, Ion: 1.22 mmol/L (ref 1.15–1.40)
Chloride: 104 mmol/L (ref 98–111)
Creatinine, Ser: 1.8 mg/dL — ABNORMAL HIGH (ref 0.61–1.24)
Glucose, Bld: 128 mg/dL — ABNORMAL HIGH (ref 70–99)
HCT: 51 % (ref 39.0–52.0)
Hemoglobin: 17.3 g/dL — ABNORMAL HIGH (ref 13.0–17.0)
Potassium: 4 mmol/L (ref 3.5–5.1)
Sodium: 141 mmol/L (ref 135–145)
TCO2: 24 mmol/L (ref 22–32)

## 2024-07-27 LAB — GLUCOSE, CAPILLARY
Glucose-Capillary: 81 mg/dL (ref 70–99)
Glucose-Capillary: 78 mg/dL (ref 70–99)

## 2024-07-27 LAB — HEMOGLOBIN A1C
Hgb A1c MFr Bld: 5.7 % — ABNORMAL HIGH (ref 4.8–5.6)
Mean Plasma Glucose: 116.89 mg/dL

## 2024-07-27 LAB — PROTIME-INR
INR: 1.2 (ref 0.8–1.2)
Prothrombin Time: 16.3 s — ABNORMAL HIGH (ref 11.4–15.2)

## 2024-07-27 LAB — APTT: aPTT: 30 s (ref 24–36)

## 2024-07-27 LAB — ETHANOL: Alcohol, Ethyl (B): <15 mg/dL (ref <15)

## 2024-07-27 MED ORDER — LORAZEPAM 2 MG/ML IJ SOLN: 1.0000 mg | Freq: Once | INTRAMUSCULAR | Status: AC

## 2024-07-27 MED ORDER — APIXABAN 5 MG PO TABS
5.0000 mg | ORAL_TABLET | Freq: Two times a day (BID) | ORAL | Status: DC
  Administered 2024-07-27 – 2024-08-05 (×18): 5 mg via ORAL
  Filled 2024-07-27 (×8): qty 1
  Filled 2024-07-27: qty 2
  Filled 2024-07-27 (×9): qty 1

## 2024-07-27 MED ORDER — SODIUM CHLORIDE 0.9 % IV SOLN: INTRAVENOUS | Status: DC | PRN

## 2024-07-27 MED ORDER — LEVETIRACETAM (KEPPRA) 500 MG/5 ML ADULT IV PUSH
1000.0000 mg | Freq: Two times a day (BID) | INTRAVENOUS | Status: DC
  Administered 2024-07-28: 06:00:00 1000 mg via INTRAVENOUS
  Filled 2024-07-27: qty 10

## 2024-07-27 MED ORDER — ACETAMINOPHEN 325 MG PO TABS: 650.0000 mg | ORAL_TABLET | Freq: Four times a day (QID) | ORAL | Status: DC | PRN

## 2024-07-27 MED ORDER — LORAZEPAM 2 MG/ML IJ SOLN
INTRAMUSCULAR | Status: AC
  Administered 2024-07-27: 11:00:00 1 mg via INTRAVENOUS
  Filled 2024-07-27: qty 1

## 2024-07-27 MED ORDER — LEVETIRACETAM (KEPPRA) 500 MG/5 ML ADULT IV PUSH
4500.0000 mg | Freq: Once | INTRAVENOUS | Status: AC
  Administered 2024-07-27: 14:00:00 4500 mg via INTRAVENOUS
  Filled 2024-07-27: qty 45

## 2024-07-27 MED ORDER — BISACODYL 10 MG RE SUPP
10.0000 mg | Freq: Every day | RECTAL | Status: DC | PRN
  Administered 2024-08-04: 10 mg via RECTAL
  Filled 2024-07-27: qty 1

## 2024-07-27 MED ORDER — ACETAMINOPHEN 650 MG RE SUPP: 650.0000 mg | Freq: Four times a day (QID) | RECTAL | Status: DC | PRN

## 2024-07-27 MED ORDER — SODIUM CHLORIDE 0.9% FLUSH: 3.0000 mL | INTRAVENOUS | Status: DC | PRN

## 2024-07-27 MED ORDER — SODIUM CHLORIDE 0.9% FLUSH
3.0000 mL | Freq: Two times a day (BID) | INTRAVENOUS | Status: DC
  Administered 2024-07-27: 23:00:00 3 mL via INTRAVENOUS

## 2024-07-27 MED ORDER — LORAZEPAM 2 MG/ML IJ SOLN
2.0000 mg | Freq: Once | INTRAMUSCULAR | Status: AC
  Administered 2024-07-27: 14:00:00 2 mg via INTRAVENOUS
  Filled 2024-07-27: qty 1

## 2024-07-27 MED ORDER — TRAZODONE HCL 50 MG PO TABS: 50.0000 mg | ORAL_TABLET | Freq: Every evening | ORAL | Status: DC | PRN

## 2024-07-27 MED ORDER — INSULIN ASPART 100 UNIT/ML IJ SOLN
0.0000 [IU] | Freq: Three times a day (TID) | INTRAMUSCULAR | Status: DC
  Administered 2024-07-29: 12:00:00 2 [IU] via SUBCUTANEOUS
  Administered 2024-07-30 – 2024-08-02 (×3): 1 [IU] via SUBCUTANEOUS
  Administered 2024-08-03 – 2024-08-05 (×2): 2 [IU] via SUBCUTANEOUS
  Filled 2024-07-27: qty 1
  Filled 2024-07-27 (×2): qty 2
  Filled 2024-07-27 (×2): qty 1
  Filled 2024-07-27: qty 2

## 2024-07-27 MED ORDER — IOHEXOL 350 MG/ML SOLN
75.0000 mL | Freq: Once | INTRAVENOUS | Status: AC | PRN
  Administered 2024-07-27: 11:00:00 75 mL via INTRAVENOUS

## 2024-07-27 MED ORDER — ONDANSETRON HCL 4 MG/2ML IJ SOLN: 4.0000 mg | Freq: Four times a day (QID) | INTRAMUSCULAR | Status: DC | PRN

## 2024-07-27 MED ORDER — ROSUVASTATIN CALCIUM 5 MG PO TABS
10.0000 mg | ORAL_TABLET | Freq: Every day | ORAL | Status: DC
  Administered 2024-07-27 – 2024-08-04 (×8): 10 mg via ORAL
  Filled 2024-07-27 (×9): qty 2

## 2024-07-27 MED ORDER — IOHEXOL 350 MG/ML SOLN: 75.0000 mL | Freq: Once | INTRAVENOUS | Status: DC | PRN

## 2024-07-27 MED ORDER — SODIUM CHLORIDE 0.9% FLUSH
3.0000 mL | Freq: Two times a day (BID) | INTRAVENOUS | Status: DC
  Administered 2024-07-27 – 2024-08-05 (×14): 3 mL via INTRAVENOUS

## 2024-07-27 MED ORDER — LISINOPRIL 2.5 MG PO TABS
5.0000 mg | ORAL_TABLET | Freq: Every day | ORAL | Status: DC
  Administered 2024-07-27 – 2024-08-05 (×10): 5 mg via ORAL
  Filled 2024-07-27 (×10): qty 2

## 2024-07-27 MED ORDER — POLYETHYLENE GLYCOL 3350 17 G PO PACK
17.0000 g | PACK | Freq: Every day | ORAL | Status: DC | PRN
  Administered 2024-08-03: 17 g via ORAL
  Filled 2024-07-27 (×2): qty 1

## 2024-07-27 MED ORDER — ONDANSETRON HCL 4 MG PO TABS: 4.0000 mg | ORAL_TABLET | Freq: Four times a day (QID) | ORAL | Status: DC | PRN

## 2024-07-27 MED ORDER — LORAZEPAM 2 MG/ML IJ SOLN: 1.0000 mg | Freq: Four times a day (QID) | INTRAMUSCULAR | Status: DC | PRN

## 2024-07-27 MED ORDER — INSULIN ASPART 100 UNIT/ML IJ SOLN: 0.0000 [IU] | Freq: Every day | INTRAMUSCULAR | Status: DC

## 2024-07-27 NOTE — ED Notes (Signed)
 2 Hour NIH mark at 1221 unable d/t pt being at MRI. Will complete upon arrival back to room.

## 2024-07-27 NOTE — ED Notes (Signed)
 Verified with pharamcy dose of keppra  ordered. Pharmacy clarified to give 1,500mg  at a time over 5 minutes a piece for a total of 15 minutes.

## 2024-07-27 NOTE — Progress Notes (Signed)
 LTM EEG hooked up and running - no initial skin breakdown - push button tested - [facility] monitoring.

## 2024-07-27 NOTE — ED Notes (Signed)
Report given to Mason RN

## 2024-07-27 NOTE — H&P (Signed)
 History and Physical    Patient: Howard Johnson FMW:[id number] DOB: October 04, 1955 DOA: 07/27/2024 DOS: the patient was seen and examined on 07/27/2024 PCP: Shona Norleen PEDLAR, MD  Patient coming from: Home  Chief Complaint:  Chief Complaint  Patient presents with   Code Stroke   HPI: Howard Johnson is a 68 y.o. 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 - - At the time of my evaluation patient already received Ativan  and is pretty drowsy - I called and obtain additional history from patient's son [name] [phone]--- apparently patient was in his usual state of health this am, and has been in his usual state of health lately--- -he was joking and in good spirits when he left for work this morning - Patient's son got a call around 10 AM to notify him that his father was having strokelike symptoms with significant aphasia No fevers or emesis or diarrhea reported -No focal extremity weakness reported , patient was actually ambulatory as per patient's son and ED staff -Patient's son reports compliance with medications and lifestyle advised since he had a stroke in February apparently he has really made a lot of changes and done well lately - Again, - At the time of my evaluation patient already received Ativan  and is pretty drowsy.  In the ED brain MRI done no more than 2 hours from onset of symptoms showed no new acute abnormalities, expected evolution of left temporal lobe hematoma since February 2025 noted. 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 Matthews appreciated--- Keppra  and Ativan  given in the ED patient is now drowsy  Review of Systems: As mentioned in the history of present illness. All other systems reviewed and are negative. Past Medical History:  Diagnosis Date   Achilles tendinitis    Left   Diabetes mellitus without complication (HCC)    Elevated PSA    Hyperlipidemia    Hypertension    Right knee pain    Shoulder pain, bilateral    Stroke [facility])    Past Surgical History:  Procedure Laterality Date   TONSILLECTOMY     Social History:  reports that he has never smoked. He has never been exposed to tobacco smoke. He has never used smokeless tobacco. He reports that he does not drink alcohol and does not use drugs.  Allergies[1]  Family History  Problem Relation Age of Onset   Dementia Mother    Lung cancer Father    Diabetes Sister     Prior to Admission medications  Medication Sig Start Date End Date Taking? Authorizing Provider  apixaban  (ELIQUIS ) 5 MG TABS tablet Take 1 tablet (5 mg total) by mouth 2 (two) times daily. 10/07/23   Love, Sharlet RAMAN, PA-C  fluticasone  (FLONASE ) 50 MCG/ACT nasal spray Place 2 sprays into both nostrils daily. 10/08/23  Love, Pamela S, PA-C  insulin  glargine (LANTUS ) 100 UNIT/ML Solostar Pen Inject 20 Units into the skin daily. Patient taking differently: Inject 15 Units into the skin daily. 10/07/23   Love, Sharlet RAMAN, PA-C  Insulin  Pen Needle 32G X 4 MM MISC Use as directed with insulin  10/07/23   Love, Sharlet RAMAN, PA-C  lisinopril  (ZESTRIL ) 5 MG tablet Take 5 mg by mouth daily. 03/23/24   [provider]  loratadine  (CLARITIN ) 10 MG tablet Take 1 tablet (10 mg total) by mouth daily. 10/08/23   Love, Sharlet RAMAN, PA-C  rosuvastatin   (CRESTOR ) 10 MG tablet Take 1 tablet (10 mg total) by mouth daily with supper. 10/07/23   Maurice Sharlet RAMAN, PA-C    Physical Exam: Vitals:   07/27/24 1454 07/27/24 1515 07/27/24 1530 07/27/24 1615  BP: 139/75 (!) 144/78 130/78 139/79  Pulse: 69   68  Resp: 18 17 (!) 22 17  Temp:      TempSrc:      SpO2: 97%   96%  Weight:      Height:        Physical Exam  Gen:-Sleepy after IV Ativan , in no acute distress  HEENT:- [place].AT, No sclera icterus Neck-Supple Neck,No JVD,.  Lungs-  CTAB , fair air movement bilaterally  CV- S1, S2 normal, RRR Abd-  +ve B.Sounds, Abd Soft, No tenderness,    Extremity/Skin:- No  edema,   good pedal pulses  Psych-affect is appropriate, oriented x3 Neuro-No tremors, new onset aphasia reported, no focal extremity weakness reported , patient was actually ambulatory as per patient's son and ED staff - At the time of my evaluation patient already received Ativan  and is pretty drowsy.  Data Reviewed: In the ED brain MRI done No more than 2 hours from onset of symptoms showed no new acute abnormalities, expected evolution of left temporal lobe hematoma since February 2025 noted. 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 and left sigmoid sinus extending to the jugular bulb noted - CT head without contrast without new acute intracranial findings - UDS negative, blood alcohol level negative - CBC essentially WNL - Creatinine 1.66 which is close to prior baseline, potassium is 4.1 sodium is 140 bicarb is 21 anion gap 14 LFTs are not elevated EKG sinus rhythm  Assessment and Plan: 1)Aphasia--- rather acute onset around 10 AM on 07/27/2024 In the ED brain MRI done No more than 2 hours from onset of symptoms showed no new acute abnormalities, expected evolution of left temporal lobe hematoma since February 2025 noted. - CT angio head and neck without LVO, nonocclusive chronic thrombosis within the  mid and distal left transverse sinus and left sigmoid sinus extending to the jugular bulb noted - CT head without contrast without new acute intracranial findings -- Teleneuro consult from Dr. Matthews appreciated, recommends EEG Discussed with in-house neurologist Dr. Michaela--- EEG to be done today -- If negative seizure workup, repeat Neuroimaging to be seriously considered later today or in a.m. 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 (Now on Eliquis  5 mg twice a day),  - Per patient's outpatient neurologist--Hypercoagulable panel initially showed decreased Antithrombin III , on repeat test it was elevated, he was already taking Eliquis  by then, which can lead to spuriously elevated Antithrombin level - complete Eliquis  for at least 12 months, -  may consider repeat Antithrombin III  level when he is off Eliquis  after February 2026 - Continue Crestor   3)DM2--previously uncontrolled with hyperglycemia -Repeat A1c pending Use Novolog /Humalog Sliding scale insulin  with Accu-Cheks/Fingersticks as ordered   4)CKD stage - 3A -Creatinine 1.66 which is close to prior baseline, - Potassium WNL -renally adjust medications, avoid nephrotoxic agents / dehydration  / hypotension   5) HTN-continue lisinopril   6)Social/Ethics-discussed with patient's son Mr. [name] Molt -Confirms DNR/DNI status but No limitations to treatment -Treat the treatable    Advance Care Planning:   Code Status: Limited: Do not attempt resuscitation (DNR) -DNR-LIMITED -Do Not Intubate/DNI    Consults: Neurology  Family Communication: I called and obtain additional history from patient's son [name] [phone]  Severity of Illness: The appropriate patient status for this patient is INPATIENT. Inpatient status is judged to be reasonable and necessary in order to provide the  required intensity of service to ensure the patient's safety. The patient's presenting symptoms, physical exam findings, and initial radiographic and laboratory data in the context of their chronic comorbidities is felt to place them at high risk for further clinical deterioration. Furthermore, it is not anticipated that the patient will be medically stable for discharge from the hospital within 2 midnights of admission.   * I certify that at the point of admission it is my clinical judgment that the patient will require inpatient hospital care spanning beyond 2 midnights from the point of admission due to high intensity of service, high risk for further deterioration and high frequency of surveillance required.*  Author: Rendall Carwin, MD 07/27/2024 5:38 PM  For on call review [email or web address].     [1]  Allergies Allergen Reactions   Latex    Tape Rash

## 2024-07-27 NOTE — Consult Note (Addendum)
 NEUROLOGY TELESTROKE CONSULT NOTE   Date of service: July 27, 2024 Patient Name: Howard Johnson MRN:  [id number] DOB:  28-Jul-1956 Chief Complaint: acute onset aphasia Requesting Provider: Dean Clarity, MD  Consult Participants: myself, patient, bedside RN Location of the provider: [facility] Location of the patient: [facility]  This consult was provided via telemedicine with 2-way video and audio communication. The patient/family was informed that care would be provided in this way and agreed to receive care in this manner.   History of Present Illness   This is a 68 yo man with hx DVST and SAH in Feb 2025, DM2, HL, HTN who presents with acute onset aphasia. LKW 0940, he was having a meeting with coworkers that ended at that time and he was normal. Coworker went to ask him a question in his office shortly after and he was unable to communicate, severe aphasia, kept saying numbers not responding to questions appropriately. Coworker called 911. Stroke code activated upon arrival to [facility] ED. On my exam patient was unable to follow commands and had severe expressive aphasia with no motor deficits. He presented with same sx in Feb and was found to have DVST and anterior L temporal lobe hemorrhage at that time. He underwent speech therapy at that time and since it was completed he has had no aphasia or other neurologic deficits. NIHSS = 6. Not a TNK candidate 2/2 hx ICH. Head CT today personal review showed expected evolution of prior L temporal bleed, no acute process, ASPECTS 10. CTA personal review showed no LVO but did redemonstrate DVST in same location, no acute change, does appear more reoganized/chronic per radiology. He has severe ipsilateral narrowing of the jugular bulb which may have contributed to slow flow in the intracranial draining veins.  At this point my suspicion was highest for L MCA ischemic stroke vs seizure (particularly given his L temporal bleed). I administered 1mg  IV ativan  which had no  effect on his aphasia. Patient taken for STAT MRI which showed no e/o acute ischemia.  LKW: 0940 Modified rankin score: 1-No significant post stroke disability and can perform usual duties with stroke symptoms IV Thrombolysis: no hx ICH  NIHSS components Score: Comment  1a Level of Conscious 0[x]  1[]  2[]  3[]      1b LOC Questions 0[]  1[]  2[x]       1c LOC Commands 0[]  1[]  2[x]       2 Best Gaze 0[x]  1[]  2[]       3 Visual 0[x]  1[]  2[]  3[]      4 Facial Palsy 0[x]  1[]  2[]  3[]      5a Motor Arm - left 0[x]  1[]  2[]  3[]  4[]  UN[]    5b Motor Arm - Right 0[x]  1[]  2[]  3[]  4[]  UN[]    6a Motor Leg - Left 0[x]  1[]  2[]  3[]  4[]  UN[]    6b Motor Leg - Right 0[x]  1[]  2[]  3[]  4[]  UN[]    7 Limb Ataxia 0[x]  1[]  2[]  3[]  UN[]     8 Sensory 0[x]  1[]  2[]  UN[]      9 Best Language 0[]  1[]  2[x]  3[]      10 Dysarthria 0[x]  1[]  2[]  UN[]      11 Extinct. and Inattention 0[x]  1[]  2[]       TOTAL:  6      ROS   UTA 2/2 aphasia  Past History   Past Medical History:  Diagnosis Date   Achilles tendinitis    Left   Diabetes mellitus without complication (HCC)    Elevated PSA    Hyperlipidemia  Hypertension    Right knee pain    Shoulder pain, bilateral    Stroke [facility])     Past Surgical History:  Procedure Laterality Date   TONSILLECTOMY      Family History: Family History  Problem Relation Age of Onset   Dementia Mother    Lung cancer Father    Diabetes Sister     Social History  reports that he has never smoked. He has never been exposed to tobacco smoke. He has never used smokeless tobacco. He reports that he does not drink alcohol and does not use drugs.  Allergies[1]  Medications  Current Medications[2]  Vitals   Vitals:   07/27/24 1031  Weight: 97.5 kg  Height: 6' 4 (1.93 m)    Body mass index is 26.16 kg/m.  Physical Exam   Gen: patient lying in bed, NAD CV: extremities appear well-perfused Resp: normal WOB  Neurologic exam MS: alert, responds to all questions by stating  numbers, unable to answer orientation questions appropriately, not able to follow commands Speech: no dysarthria, no aphasia CN: PERRL, VFF, EOMI, sensation intact, face symmetric, hearing intact to voice Motor: no drift in any extremity Sensory: SILT Reflexes: UTA 2/2 tele exam Coordination: UTA 2/2 inability to follow commands Gait: deferred  Labs/Imaging/Neurodiagnostic studies   CBC: No results for input(s): WBC, NEUTROABS, HGB, HCT, MCV, PLT in the last 168 hours. Basic Metabolic Panel:  Lab Results  Component Value Date   NA 136 10/07/2023   K 3.9 10/07/2023   CO2 26 10/07/2023   GLUCOSE 108 (H) 10/07/2023   BUN 20 10/07/2023   CREATININE 1.74 (H) 10/07/2023   CALCIUM  9.1 10/07/2023   GFRNONAA 42 (L) 10/07/2023   Lipid Panel:  Lab Results  Component Value Date   LDLCALC 54 09/27/2023   HgbA1c:  Lab Results  Component Value Date   HGBA1C 12.4 (H) 09/30/2023   Urine Drug Screen:     Component Value Date/Time   LABOPIA NONE DETECTED 09/26/2023 1148   COCAINSCRNUR NONE DETECTED 09/26/2023 1148   LABBENZ NONE DETECTED 09/26/2023 1148   AMPHETMU NONE DETECTED 09/26/2023 1148   THCU NONE DETECTED 09/26/2023 1148   LABBARB NONE DETECTED 09/26/2023 1148    Alcohol Level No results found for: ETH INR No results found for: INR APTT No results found for: APTT AED levels: No results found for: PHENYTOIN, ZONISAMIDE, LAMOTRIGINE, LEVETIRACETA  CT Head without contrast(Personally reviewed): Expected evolution of prior left temporal lobe hemorrhage, now with encephalomalacia. No acute intracranial abnormality. ASPECTS 10.  CT angio Head and Neck with contrast(Personally reviewed): 1. No acute large vessel occlusion. 2. 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 Moderate narrowing of the left sigmoid sinus. The left jugular bulb is severely narrowed  without occlusion, with small venous collaterals noted. 3. The internal jugular vein below the skull base is diminutive in caliber with a focal severe narrowing as it passes posterior to the styloid process. 4. Additional narrowing and moderate irregularity of the proximal left transverse sinus 5. Approximately 50% stenosis at the origin of the right cervical internal carotid artery. 6. Mild irregularity and mild stenosis of the mid basilar artery.  MRI Brain(Personally reviewed): 1. No acute intracranial abnormality. 2. Expected evolution left temporal lobe hematoma since February. Stable abnormal left transverse and sigmoid venous sinuses.  ASSESSMENT   68 yo man with hx DVST and SAH in Feb 2025, DM2, HL, HTN who presents  with acute onset severe global aphasia. No acute findings on CT head and CTA H&N. No acute ischemia on MRI brain. Patient remains severely aphasic. 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   RECOMMENDATIONS   - Will order another 2mg  IV ativan  - Load with keppra  60mg /kg f/b 500mg  bid - Transfer to [facility] hospitalist service for cEEG. Please notify [facility] neurohospitalist upon patient's arrival. We will get him hooked up to cEEG and follow him there in consultation  Please reach out with any questions or concerns ______________________________________________________________________    Signed, Elida CHRISTELLA Ross, MD [facility]     [1]  Allergies Allergen Reactions   Latex    Tape Rash  [2]  Current Facility-Administered Medications:    iohexol  (OMNIPAQUE ) 350 MG/ML injection 75 mL, 75 mL, Intravenous, Once PRN, Haviland, Julie, MD  Current Outpatient Medications:    apixaban  (ELIQUIS ) 5 MG TABS tablet, Take 1 tablet (5 mg total) by mouth 2 (two) times daily., Disp: 60 tablet, Rfl: 0   fluticasone  (FLONASE ) 50 MCG/ACT nasal spray, Place 2 sprays into both nostrils daily., Disp: 16  g, Rfl: 0   insulin  glargine (LANTUS ) 100 UNIT/ML Solostar Pen, Inject 20 Units into the skin daily. (Patient taking differently: Inject 15 Units into the skin daily.), Disp: 15 mL, Rfl: 0   Insulin  Pen Needle 32G X 4 MM MISC, Use as directed with insulin , Disp: 100 each, Rfl: 0   lisinopril  (ZESTRIL ) 5 MG tablet, Take 5 mg by mouth daily., Disp: , Rfl:    loratadine  (CLARITIN ) 10 MG tablet, Take 1 tablet (10 mg total) by mouth daily., Disp: 30 tablet, Rfl: 0   rosuvastatin  (CRESTOR ) 10 MG tablet, Take 1 tablet (10 mg total) by mouth daily with supper., Disp: 30 tablet, Rfl: 0

## 2024-07-27 NOTE — ED Notes (Signed)
 Pt just returned from CT. Tele neuro speaking with family and co worker at this time.

## 2024-07-27 NOTE — ED Notes (Signed)
[facility] at bedside for transport to [facility].

## 2024-07-27 NOTE — ED Notes (Signed)
 On way with pt to CT, pt has aphasia.pupils equal and reactive with right pupil slightly less brisk than left, both reactive. Pt moving all extremities. See triage NIH. Color wnl. Warm to touch.

## 2024-07-27 NOTE — ED Triage Notes (Signed)
 Pt in with symptoms of stroke. C/o of expressive aphasia and confusion. Pt with a coworker who said LKW was 0940. Pt has hx of stroke from February.

## 2024-07-27 NOTE — ED Provider Notes (Signed)
 [place] EMERGENCY DEPARTMENT AT [facility] Provider Note   CSN: [id number] Arrival date & time: 07/27/24  1019  An emergency department physician performed an initial assessment on this suspected stroke patient at 1030.  Patient presents with: Code Stroke   Howard Johnson is a 68 y.o. male.   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  He was at work and was seen normal about 1 hour pta.  His co-workers were about to start a meeting and asked him a question.  He could not answer it and seemed confused.  The office is close by, so she brought him here.  LKW at 0940.  Code stroke called upon arrival to the ED.         Prior to Admission medications  Medication Sig Start Date End Date Taking? Authorizing Provider  apixaban  (ELIQUIS ) 5 MG TABS tablet Take 1 tablet (5 mg total) by mouth 2 (two) times daily. 10/07/23   Love, Sharlet RAMAN, PA-C  fluticasone  (FLONASE ) 50 MCG/ACT nasal spray Place 2 sprays into both nostrils daily. 10/08/23   Love, Sharlet RAMAN, PA-C  insulin  glargine (LANTUS ) 100 UNIT/ML Solostar Pen Inject 20 Units into the skin daily. Patient taking differently: Inject 15 Units into the skin daily. 10/07/23   Maurice Sharlet RAMAN, PA-C  Insulin  Pen Needle 32G X 4 MM MISC Use as directed with insulin  10/07/23   Love, Sharlet RAMAN, PA-C  lisinopril  (ZESTRIL ) 5 MG tablet Take 5 mg by mouth daily. 03/23/24   [provider]  loratadine  (CLARITIN ) 10 MG tablet Take 1 tablet (10 mg total) by mouth daily. 10/08/23   Love, Sharlet RAMAN, PA-C  rosuvastatin  (CRESTOR ) 10 MG tablet Take 1 tablet (10 mg total) by mouth daily with supper. 10/07/23   Maurice Sharlet RAMAN, PA-C    Allergies: Latex and Tape    Review of Systems  Neurological:  Positive for speech difficulty.  All other systems reviewed and are negative.   Updated Vital Signs BP 133/78   Pulse 65   Resp 16   Ht 6' 4 (1.93 m)   Wt 97.5 kg   SpO2 93%   BMI  26.16 kg/m   Physical Exam Vitals and nursing note reviewed.  Constitutional:      Appearance: Normal appearance.  HENT:     Head: Normocephalic and atraumatic.     Right Ear: External ear normal.     Left Ear: External ear normal.     Nose: Nose normal.     Mouth/Throat:     Mouth: Mucous membranes are moist.     Pharynx: Oropharynx is clear.  Eyes:     Extraocular Movements: Extraocular movements intact.     Conjunctiva/sclera: Conjunctivae normal.     Pupils: Pupils are equal, round, and reactive to light.  Cardiovascular:     Rate and Rhythm: Normal rate and regular rhythm.     Pulses: Normal pulses.     Heart sounds: Normal heart sounds.  Pulmonary:     Effort: Pulmonary effort is normal.     Breath sounds: Normal breath sounds.  Abdominal:     General: Abdomen is flat. Bowel sounds are normal.     Palpations: Abdomen is soft.  Musculoskeletal:        General: Normal range of motion.     Cervical back: Normal range of motion and neck supple.  Skin:    General: Skin is warm.  Capillary Refill: Capillary refill takes less than 2 seconds.  Neurological:     Mental Status: He is alert.     Comments: aphasia     (all labs ordered are listed, but only abnormal results are displayed) Labs Reviewed  PROTIME-INR - Abnormal; Notable for the following components:      Result Value   Prothrombin Time 16.3 (*)    All other components within normal limits  CBC - Abnormal; Notable for the following components:   RBC 5.99 (*)    HCT 52.1 (*)    All other components within normal limits  DIFFERENTIAL - Abnormal; Notable for the following components:   Neutro Abs 8.4 (*)    All other components within normal limits  COMPREHENSIVE METABOLIC PANEL WITH GFR - Abnormal; Notable for the following components:   CO2 21 (*)    Glucose, Bld 133 (*)    BUN 28 (*)    Creatinine, Ser 1.66 (*)    GFR, Estimated 44 (*)    All other components within normal limits  CBG MONITORING,  ED - Abnormal; Notable for the following components:   Glucose-Capillary 133 (*)    All other components within normal limits  I-STAT CHEM 8, ED - Abnormal; Notable for the following components:   BUN 31 (*)    Creatinine, Ser 1.80 (*)    Glucose, Bld 128 (*)    Hemoglobin 17.3 (*)    All other components within normal limits  APTT  ETHANOL  URINE DRUG SCREEN    EKG: EKG Interpretation Date/Time:  Monday July 27 2024 10:59:03 EST Ventricular Rate:  74 PR Interval:  211 QRS Duration:  101 QT Interval:  390 QTC Calculation: 433 R Axis:   15  Text Interpretation: Sinus rhythm Since last tracing rate slower Confirmed by Dean Clarity [phone]) on 07/27/2024 11:00:57 AM  Radiology: MR BRAIN WO CONTRAST Result Date: 07/27/2024 EXAM: MRI BRAIN WITHOUT CONTRAST 07/27/2024 12:30:49 PM TECHNIQUE: Multiplanar multisequence MRI of the head/brain was performed without the administration of intravenous contrast. COMPARISON: CT head and CTA head and neck 07/27/2024. Prior brain MRI 09/26/2023. CLINICAL HISTORY: 68 year old male with acute neurological deficit, stroke suspected. 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 FINDINGS: BRAIN AND VENTRICLES: No mass. No midline shift. No hydrocephalus. The sella is unremarkable. Major arterial flow voids appear stable, preserved. Chronically abnormal left transverse sinus (series 8 image 8) and left sigmoid sinus (series 8 image 4) vascular flow voids which do not appear significantly changed from the February MRI. Posterior left temporal lobe encephalomalacia corresponding to previous intra axial hematoma site. Chronic hemosiderin there appears regressed on SWI compared to February. No new chronic cerebral blood products. No acute infarct. Stable gray and white matter signal elsewhere including patchy periventricular and other mild scattered nonspecific white matter T2 and FLAIR hyperintensity. Deep gray nuclei, brainstem, and  cerebellum appear negative. Negative visible cervical spine. ORBITS: No acute abnormality. SINUSES AND MASTOIDS: Paranasal sinuses and mastoids are well aerated. BONES AND SOFT TISSUES: Normal marrow signal. No acute soft tissue abnormality. IMPRESSION: 1. No acute intracranial abnormality. 2. Expected evolution left temporal lobe hematoma since February. Stable abnormal left transverse and sigmoid venous sinuses. Electronically signed by: Helayne Hurst MD 07/27/2024 12:50 PM EST RP Workstation: [id number]   CT ANGIO HEAD NECK W WO CM (CODE STROKE) Result Date: 07/27/2024 EXAM: CTA Head and Neck with Intravenous Contrast. CT Head without Contrast. CLINICAL HISTORY: Neuro deficit, acute, stroke suspected. TECHNIQUE: Axial CTA images of  the head and neck performed with intravenous contrast. MIP reconstructed images were created and reviewed. Axial computed tomography images of the head/brain performed without intravenous contrast. Note: Per PQRS, the description of internal carotid artery percent stenosis, including 0 percent or normal exam, is based on [place] American Symptomatic Carotid Endarterectomy Trial (NASCET) criteria. Dose reduction technique was used including one or more of the following: automated exposure control, adjustment of mA and kV according to patient size, and/or iterative reconstruction. CONTRAST: Without and with; 75 mL (iohexol  (OMNIPAQUE ) 350 MG/ML injection 75 mL IOHEXOL  350 MG/ML SOLN). COMPARISON: CT head CT venogram and CTA head 09/26/2023. FINDINGS: CTA NECK: COMMON CAROTID ARTERIES: The right carotid artery is patent from the origin to the skull base. There is calcified atherosclerosis at the right carotid bifurcation. The left carotid artery is patent from the origin to the skull base. Calcified atherosclerosis at the left carotid bifurcation without hemodynamically significant stenosis. No dissection or occlusion. INTERNAL CAROTID ARTERIES: There is approximately 50% stenosis at the  origin of the right cervical ICA. No stenosis by NASCET criteria. No dissection or occlusion. VERTEBRAL ARTERIES: The vertebral arteries are patent from the origins to the vertebrobasilar confluence. There is mild atherosclerosis of the right V1 and V3 segments without significant stenosis. The left vertebral artery origin is slightly obscured due to streak artifact and tortuosity of the vessel at this level. There is likely moderate osseous stenosis of the left vertebral artery. No dissection or occlusion. CTA HEAD: ANTERIOR CEREBRAL ARTERIES: The anterior cerebral arteries are patent bilaterally. No significant stenosis. No occlusion. No aneurysm. MIDDLE CEREBRAL ARTERIES: The middle cerebral arteries are patent bilaterally. No significant stenosis. No occlusion. No aneurysm. POSTERIOR CEREBRAL ARTERIES: The PCAs are patent bilaterally. No significant stenosis. No occlusion. No aneurysm. BASILAR ARTERY: There is mild irregularity of the basilar artery. Mild stenosis of the mid basilar artery. Small AICA noted bilaterally. PICA visualized bilaterally. Superior cerebellar arteries are patent bilaterally. No aneurysm. OTHER: SOFT TISSUES: Subcentimeter bilateral thyroid  nodules. No acute finding. No masses or lymphadenopathy. BONES: Mild degenerative changes in the visualized spine. No acute osseous abnormality. VASCULATURE (Intracranial): The intracranial internal carotid arteries are patent bilaterally. Atherosclerosis of the carotid siphons, right greater than left. No high grade stenosis of the carotid arteries. VENOUS SINUSES: There are filling defects again noted within the mid and distal left transverse sinus and left sigmoid sinus extending to the jugular bulb. This is compatible with thrombus seen on prior studies. The thrombus on the current study appears reorganized and is more chronic appearing, primarily located within the central portions of the venous sinuses. The thrombus is nonocclusive. There is  narrowing and moderate irregularity of the proximal left transverse sinus without significant thrombus in this region. There is 1 possible small focus of nonocclusive thrombus within the proximal left transverse sinus on series 6 image 111. There is moderate narrowing of the left sigmoid sinus. The left jugular bulb appears severely narrowed without occlusion. There are a few small caliber venous collaterals within this region. The internal jugular vein below the skull base is significantly narrowed. There is a focus of severe narrowing of the internal jugular vein as it passes posterior to the styloid process on series 6 image 159. Below this level, there are areas of mixed attenuation likely reflecting mixing of contrast opacified and non opacified blood. AORTIC ARCH/SUBCLAVIAN ARTERIES: There is minimal calcified atherosclerosis along the visualized aortic arch. The left subclavian artery has mixed atherosclerotic plaque along the proximal portion without significant stenosis.  IMPRESSION: 1. No acute large vessel occlusion. 2. 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 Moderate narrowing of the left sigmoid sinus. The left jugular bulb is severely narrowed without occlusion, with small venous collaterals noted. 3. The internal jugular vein below the skull base is diminutive in caliber with a focal severe narrowing as it passes posterior to the styloid process. 4. Additional narrowing and moderate irregularity of the proximal left transverse sinus 5. Approximately 50% stenosis at the origin of the right cervical internal carotid artery. 6. Mild irregularity and mild stenosis of the mid basilar artery. 7. Findings discussed with Dr. Matthews at 11:29 AM on 07/27/24. Electronically signed by: Donnice Mania MD 07/27/2024 11:32 AM EST RP Workstation: [id number]   CT HEAD CODE STROKE WO CONTRAST (LKW 0-4.5h, LVO 0-24h) Result Date:  07/27/2024 EXAM: CT HEAD WITHOUT CONTRAST 07/27/2024 10:43:37 AM TECHNIQUE: CT of the head was performed without the administration of intravenous contrast. Automated exposure control, iterative reconstruction, and/or weight based adjustment of the mA/kV was utilized to reduce the radiation dose to as low as reasonably achievable. COMPARISON: CT head 09/29/2023, brain MRI 09/26/2023. CLINICAL HISTORY: 67 year old male with acute neurological deficit, stroke suspected. 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 FINDINGS: BRAIN AND VENTRICLES: No acute hemorrhage. No evidence of acute infarct. No hydrocephalus. No extra-axial collection. No mass effect or midline shift. Posterior left temporal lobe encephalomalacia now corresponding to site of intra axial hemorrhage in February. Background brain volume is stable. Stable gray white differentiation. No suspicious intracranial vascular hyperdensity. Calcified atherosclerosis at the skull base. ORBITS: No acute abnormality. SINUSES: Paranasal sinuses, tympanic cavities and mastoids are well aerated. SOFT TISSUES AND SKULL: No acute soft tissue abnormality. No skull fracture. No gaze deviation. [place] stroke program early CT score (aspects) ----- Ganglionic (caudate, ic, lentiform nucleus, insula, M1-m3): 7 Supraganglionic (m4-m6): 3 Total: 10 IMPRESSION: 1. Expected evolution of prior left temporal lobe hemorrhage, now iwth encephalomalacia. No acute intracranial abnormality. ASPECTS 10. 2. These results were communicated to Dr. Matthews at 10:50 on 07/27/2024 by text page via the [facility] messaging system. Electronically signed by: Helayne Hurst MD 07/27/2024 10:52 AM EST RP Workstation: [id number]     Procedures   Medications Ordered in the ED  LORazepam  (ATIVAN ) injection 2 mg (has no administration in time range)  levETIRAcetam  (KEPPRA ) undiluted injection 4,500 mg (has no administration in time range)  iohexol  (OMNIPAQUE ) 350 MG/ML injection  75 mL (75 mLs Intravenous Contrast Given 07/27/24 1054)  LORazepam  (ATIVAN ) injection 1 mg (1 mg Intravenous Given 07/27/24 1102)                                    Medical Decision Making Amount and/or Complexity of Data Reviewed Labs: ordered. Radiology: ordered.  Risk Prescription drug management. Decision regarding hospitalization.   This patient presents to the ED for concern of speech difficulty, this involves an extensive number of treatment options, and is a complaint that carries with it a high risk of complications and morbidity.  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  The pertinent results include:  cbc nl, cmp nl other than cr sl elevated at 1.66 (stable), inr 1.2, etoh <16; UDS neg   Imaging Studies ordered:  I ordered imaging studies including ct head/cta head/neck  I independently visualized and interpreted imaging which showed  CT head:  Expected evolution of prior left temporal lobe hemorrhage, now iwth  encephalomalacia. No acute intracranial abnormality. ASPECTS 10. CTA head/neck:  No acute large vessel occlusion.  2. Nonocclusive chronic thrombus within the mid and distal left transverse  sinus and left sigmoid sinus extending to the jugular bulb. Moderate narrowing  of the left sigmoid sinus. The left jugular bulb is severely narrowed without  occlusion, with small venous collaterals noted.  3. The internal jugular vein below the skull base is diminutive in caliber with  a focal severe narrowing as it passes posterior to the styloid process.  4. Additional narrowing and moderate irregularity  of the proximal left  transverse sinus  5. Approximately 50% stenosis at the origin of the right cervical internal  carotid artery.  6. Mild irregularity and mild stenosis of the mid basilar artery.  7. Findings discussed with Dr. Matthews at 11:29 AM on 07/27/24.    MRI brain:  No acute intracranial abnormality.  2. Expected evolution left temporal lobe hematoma since February. Stable  abnormal left transverse and sigmoid venous sinuses.   I agree with the radiologist interpretation   Cardiac Monitoring:  The patient was maintained on a cardiac monitor.  I personally viewed and interpreted the cardiac monitored which showed an underlying rhythm of: nsr   Medicines ordered and prescription drug management:  I ordered medication including keppra   for sx  Reevaluation of the patient after these medicines showed that the patient improved I have reviewed the patients home medicines and have made adjustments as needed   Test Considered:  Ct/mri   Critical Interventions:  Code stroke   Consultations Obtained:  I requested consultation with the neurologist (Dr. 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  She ordered keppra  and recommends hospitalist admit for to [facility] for EEG. Pt d/w Dr. Pearlean (triad) for admission.   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  Not a stroke.  He did not receive tnk as he's on eliquis .  No LVO on CTA, so he's not an IR candidate.  MRI neg for CVA.  Pt started on keppra .     Reevaluation:  After the interventions noted above, I reevaluated the patient and found that they have :stayed the same   Social Determinants of Health:  Lives at home   Dispostion:  After consideration of the diagnostic results and the patients response to treatment, I feel that the patent would benefit from  admission.  CRITICAL CARE Performed by: Mliss Boyers   Total critical care time: 30 minutes  Critical care time was exclusive of separately billable procedures and treating other patients.  Critical care was necessary to treat or prevent imminent or life-threatening deterioration.  Critical care was time spent personally by me on the following activities: development of treatment plan with patient and/or surrogate as well as nursing, discussions with consultants, evaluation of patient's response to treatment, examination of patient, obtaining history from patient or surrogate, ordering and performing treatments and interventions, ordering and review of laboratory studies, ordering and review  of radiographic studies, pulse oximetry and re-evaluation of patient's condition.        Final diagnoses:  Stage 3b chronic kidney disease [facility])  Aphasia    ED Discharge Orders     None          Dean Clarity, MD 07/27/24 1335

## 2024-07-27 NOTE — ED Notes (Signed)
 Patient transported to MRI

## 2024-07-28 ENCOUNTER — Inpatient Hospital Stay (HOSPITAL_COMMUNITY)

## 2024-07-28 ENCOUNTER — Other Ambulatory Visit (HOSPITAL_COMMUNITY): Payer: Self-pay

## 2024-07-28 LAB — GLUCOSE, CAPILLARY
Glucose-Capillary: 93 mg/dL (ref 70–99)
Glucose-Capillary: 93 mg/dL (ref 70–99)
Glucose-Capillary: 112 mg/dL — ABNORMAL HIGH (ref 70–99)
Glucose-Capillary: 94 mg/dL (ref 70–99)
Glucose-Capillary: 159 mg/dL — ABNORMAL HIGH (ref 70–99)

## 2024-07-28 MED ORDER — VALPROATE SODIUM 100 MG/ML IV SOLN
500.0000 mg | Freq: Three times a day (TID) | INTRAVENOUS | Status: DC
  Administered 2024-07-29 – 2024-08-01 (×10): 500 mg via INTRAVENOUS
  Filled 2024-07-28: qty 500
  Filled 2024-07-28 (×2): qty 5
  Filled 2024-07-28: qty 500
  Filled 2024-07-28 (×2): qty 5
  Filled 2024-07-28: qty 500
  Filled 2024-07-28: qty 5
  Filled 2024-07-28: qty 500
  Filled 2024-07-28 (×2): qty 5
  Filled 2024-07-28: qty 500
  Filled 2024-07-28: qty 5
  Filled 2024-07-28: qty 500

## 2024-07-28 MED ORDER — VALPROATE SODIUM 100 MG/ML IV SOLN
3000.0000 mg | Freq: Once | INTRAVENOUS | Status: AC
  Administered 2024-07-28: 18:00:00 3000 mg via INTRAVENOUS
  Filled 2024-07-28: qty 30

## 2024-07-28 MED ORDER — LACOSAMIDE 50 MG PO TABS
100.0000 mg | ORAL_TABLET | Freq: Two times a day (BID) | ORAL | Status: DC
  Administered 2024-07-28 – 2024-07-29 (×2): 100 mg via ORAL
  Filled 2024-07-28 (×2): qty 2

## 2024-07-28 MED ORDER — LEVETIRACETAM (KEPPRA) 500 MG/5 ML ADULT IV PUSH
1000.0000 mg | Freq: Two times a day (BID) | INTRAVENOUS | Status: DC
  Administered 2024-07-28 – 2024-08-04 (×14): 1000 mg via INTRAVENOUS
  Filled 2024-07-28 (×15): qty 10

## 2024-07-28 MED ORDER — LORAZEPAM 2 MG/ML IJ SOLN
1.0000 mg | Freq: Once | INTRAMUSCULAR | Status: AC
  Administered 2024-07-28: 11:00:00 1 mg via INTRAVENOUS
  Filled 2024-07-28: qty 1

## 2024-07-28 MED ORDER — LEVETIRACETAM (KEPPRA) 500 MG/5 ML ADULT IV PUSH: 1500.0000 mg | Freq: Two times a day (BID) | INTRAVENOUS | Status: DC

## 2024-07-28 MED ORDER — SODIUM CHLORIDE 0.9 % IV SOLN
200.0000 mg | INTRAVENOUS | Status: AC
  Administered 2024-07-28: 11:00:00 200 mg via INTRAVENOUS
  Filled 2024-07-28: qty 20

## 2024-07-28 MED ORDER — SALINE SPRAY 0.65 % NA SOLN
1.0000 | NASAL | Status: DC | PRN
  Administered 2024-07-28 – 2024-07-29 (×2): 1 via NASAL
  Filled 2024-07-28: qty 44

## 2024-07-28 NOTE — Progress Notes (Signed)
 PROGRESS NOTE    Howard Johnson  FMW:[id number] DOB: 1956-04-30 DOA: 07/27/2024 PCP: Shona Norleen PEDLAR, MD   Brief Narrative:  This is a 68 yo man with hx DVST and SAH in Feb 2025, DM2, HL, HTN who presented to [facility] with acute onset aphasia. LKW 0940 while he was having a meeting with coworkers that ended at that time and he was normal. Coworker went to ask him a question in his office shortly after and he was unable to communicate, severe aphasia, kept saying numbers not responding to questions appropriately. Coworker called 911. Stroke code activated upon arrival ED. On exam patient was unable to follow commands and had severe expressive aphasia with no motor deficits. He presented with same sx in Feb and was found to have dural venous sinus thrombosis and anterior L temporal lobe hemorrhage at that time. He underwent speech therapy at that time and since it was completed he has had no aphasia or other neurologic deficits. NIHSS = 6. Not a TNK candidate 2/2 hx ICH. Head CT showed expected evolution of prior L temporal bleed, no acute process, ASPECTS 10. CTA showed no LVO but did redemonstrate DVST in same location, no acute change, does appear more reoganized/chronic per radiology. He has severe ipsilateral narrowing of the jugular bulb which may have contributed to slow flow in the intracranial draining veins. MRI brain showed no new acute abnormalities, expected evolution of left temporal lobe hematoma since February 2025 noted.  UDS negative.  Per neurology recommendation, patient was transferred to [facility] for seizure workup.  Assessment & Plan:   Principal Problem:   Aphasia Active Problems:   Chronic kidney disease 3A   Dural venous sinus thrombosis   Intracranial hemorrhage (HCC)   Essential hypertension   DM (diabetes mellitus), type 2 (HCC)   Cerebral venous sinus thrombosis   Antithrombin III  deficiency  Aphasia/strokelike symptoms: Stroke workup was negative based on the MRI  brain, CT head, CT angiogram head and neck.  However, nonocclusive chronic thrombosis within the mid and distal left transverse sinus and left sigmoid sinus extending to the jugular bulb noted.  Neurology suspected seizure, loaded with Keppra  and given Ativan  in the ED, transferred to [facility], remains on continuous EEG.  Patient exhibiting significant dysarthria and palilalia and perhaps has some receptive aphasia as well.  May benefit from repeat MRI brain however since we have neurology on board, will defer to them.  Appreciate neurology help.  History of left temporal lobe intracranial bleeding with cerebral venous thrombosis in February 2025/hyperlipidemia:  Per patient's outpatient neurologist--Hypercoagulable panel initially showed decreased Antithrombin III , on repeat test it was elevated, he was already taking Eliquis  by then, which can lead to spuriously elevated Antithrombin level. complete Eliquis  for at least 12 months. may consider repeat Antithrombin III  level when he is off Eliquis  after February 2026.  Continue Crestor .  Type 2 diabetes mellitus: Hemoglobin A1c 5.7.  Interestingly this was 12.410 months ago.  Appears to be on Lantus  20 units daily at home.  Currently only on SSI and blood sugar control.  Monitor closely.  CKD stage IIIa: At baseline.  Essential hypertension: Controlled on lisinopril .  DVT prophylaxis: SCDs Start: 07/27/24 1625 Place TED hose Start: 07/27/24 1625 SCDs Start: 07/27/24 1617 Place TED hose Start: 07/27/24 1617   Code Status: Limited: Do not attempt resuscitation (DNR) -DNR-LIMITED -Do Not Intubate/DNI   Family Communication:  None present at bedside.   Status is: Inpatient Remains inpatient appropriate because: Still symptomatic  with dysarthria   Estimated body mass index is 26.16 kg/m as calculated from the following:   Height as of this encounter: 6' 4 (1.93 m).   Weight as of this encounter: 97.5 kg.    Nutritional Assessment: Body mass  index is 26.16 kg/m.SABRA Seen by dietician.  I agree with the assessment and plan as outlined below: Nutrition Status:        . Skin Assessment: I have examined the patient's skin and I agree with the wound assessment as performed by the wound care RN as outlined below:    Consultants:  Neurology  Procedures:  None  Antimicrobials:  Anti-infectives (From admission, onward)    None         Subjective: Seen and examined, fully alert and appears to be oriented as well.  Patient appears to be repeating same words and repeating his own name when asked different questions.  It sounds like patient at times is comprehending the question but at times not.  He is a speech at times does not make sense and is garbled hence is incomprehensible as well.  Objective: Vitals:   07/27/24 1947 07/27/24 2310 07/28/24 0354 07/28/24 0736  BP: 130/70 112/78 108/71 107/69  Pulse: 65 62 71 81  Resp: 18 18 18 18   Temp: (!) 97.3 F (36.3 C) 98.6 F (37 C) 98 F (36.7 C) 98.4 F (36.9 C)  TempSrc: Oral Oral Oral Oral  SpO2: 98% 98% 94% 94%  Weight:      Height:       No intake or output data in the 24 hours ending 07/28/24 0813 Filed Weights   07/27/24 1031  Weight: 97.5 kg    Examination:  General exam: Appears calm and comfortable  Respiratory system: Clear to auscultation. Respiratory effort normal. Cardiovascular system: S1 & S2 heard, RRR. No JVD, murmurs, rubs, gallops or clicks. No pedal edema. Gastrointestinal system: Abdomen is nondistended, soft and nontender. No organomegaly or masses felt. Normal bowel sounds heard. Central nervous system: Alert and oriented.  Significant dysarthria, no focal neurological motor deficit.  At times does not follow commands. Extremities: Symmetric 5 x 5 power. Skin: No rashes, lesions or ulcers   Data Reviewed: I have personally reviewed following labs and imaging studies  CBC: Recent Labs  Lab 07/27/24 1030 07/27/24 1039  WBC 10.0   --   NEUTROABS 8.4*  --   HGB 16.7 17.3*  HCT 52.1* 51.0  MCV 87.0  --   PLT 379  --    Basic Metabolic Panel: Recent Labs  Lab 07/27/24 1030 07/27/24 1039  NA 140 141  K 4.1 4.0  CL 104 104  CO2 21*  --   GLUCOSE 133* 128*  BUN 28* 31*  CREATININE 1.66* 1.80*  CALCIUM  9.2  --    GFR: Estimated Creatinine Clearance: 47.6 mL/min (A) (by C-G formula based on SCr of 1.8 mg/dL (H)). Liver Function Tests: Recent Labs  Lab 07/27/24 1030  AST 24  ALT 21  ALKPHOS 96  BILITOT 0.5  PROT 7.1  ALBUMIN 4.8   No results for input(s): LIPASE, AMYLASE in the last 168 hours. No results for input(s): AMMONIA in the last 168 hours. Coagulation Profile: Recent Labs  Lab 07/27/24 1030  INR 1.2   Cardiac Enzymes: No results for input(s): CKTOTAL, CKMB, CKMBINDEX, TROPONINI in the last 168 hours. BNP (last 3 results) No results for input(s): PROBNP in the last 8760 hours. HbA1C: Recent Labs    07/27/24 1847  HGBA1C 5.7*   CBG: Recent Labs  Lab 07/27/24 1028 07/27/24 1750 07/27/24 2321 07/28/24 0618  GLUCAP 133* 81 78 93   Lipid Profile: No results for input(s): CHOL, HDL, LDLCALC, TRIG, CHOLHDL, LDLDIRECT in the last 72 hours. Thyroid  Function Tests: No results for input(s): TSH, T4TOTAL, FREET4, T3FREE, THYROIDAB in the last 72 hours. Anemia Panel: No results for input(s): VITAMINB12, FOLATE, FERRITIN, TIBC, IRON, RETICCTPCT in the last 72 hours. Sepsis Labs: No results for input(s): PROCALCITON, LATICACIDVEN in the last 168 hours.  No results found for this or any previous visit (from the past 240 hours).   Radiology Studies: MR BRAIN WO CONTRAST Result Date: 07/27/2024 EXAM: MRI BRAIN WITHOUT CONTRAST 07/27/2024 12:30:49 PM TECHNIQUE: Multiplanar multisequence MRI of the head/brain was performed without the administration of intravenous contrast. COMPARISON: CT head and CTA head and neck 07/27/2024. Prior  brain MRI 09/26/2023. CLINICAL HISTORY: 68 year old male with acute neurological deficit, stroke suspected. 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 FINDINGS: BRAIN AND VENTRICLES: No mass. No midline shift. No hydrocephalus. The sella is unremarkable. Major arterial flow voids appear stable, preserved. Chronically abnormal left transverse sinus (series 8 image 8) and left sigmoid sinus (series 8 image 4) vascular flow voids which do not appear significantly changed from the February MRI. Posterior left temporal lobe encephalomalacia corresponding to previous intra axial hematoma site. Chronic hemosiderin there appears regressed on SWI compared to February. No new chronic cerebral blood products. No acute infarct. Stable gray and white matter signal elsewhere including patchy periventricular and other mild scattered nonspecific white matter T2 and FLAIR hyperintensity. Deep gray nuclei, brainstem, and cerebellum appear negative. Negative visible cervical spine. ORBITS: No acute abnormality. SINUSES AND MASTOIDS: Paranasal sinuses and mastoids are well aerated. BONES AND SOFT TISSUES: Normal marrow signal. No acute soft tissue abnormality. IMPRESSION: 1. No acute intracranial abnormality. 2. Expected evolution left temporal lobe hematoma since February. Stable abnormal left transverse and sigmoid venous sinuses. Electronically signed by: Helayne Hurst MD 07/27/2024 12:50 PM EST RP Workstation: [id number]   CT ANGIO HEAD NECK W WO CM (CODE STROKE) Result Date: 07/27/2024 EXAM: CTA Head and Neck with Intravenous Contrast. CT Head without Contrast. CLINICAL HISTORY: Neuro deficit, acute, stroke suspected. TECHNIQUE: Axial CTA images of the head and neck performed with intravenous contrast. MIP reconstructed images were created and reviewed. Axial computed tomography images of the head/brain performed without intravenous contrast. Note: Per PQRS, the description of internal carotid artery percent  stenosis, including 0 percent or normal exam, is based on [place] American Symptomatic Carotid Endarterectomy Trial (NASCET) criteria. Dose reduction technique was used including one or more of the following: automated exposure control, adjustment of mA and kV according to patient size, and/or iterative reconstruction. CONTRAST: Without and with; 75 mL (iohexol  (OMNIPAQUE ) 350 MG/ML injection 75 mL IOHEXOL  350 MG/ML SOLN). COMPARISON: CT head CT venogram and CTA head 09/26/2023. FINDINGS: CTA NECK: COMMON CAROTID ARTERIES: The right carotid artery is patent from the origin to the skull base. There is calcified atherosclerosis at the right carotid bifurcation. The left carotid artery is patent from the origin to the skull base. Calcified atherosclerosis at the left carotid bifurcation without hemodynamically significant stenosis. No dissection or occlusion. INTERNAL CAROTID ARTERIES: There is approximately 50% stenosis at the origin of the right cervical ICA. No stenosis by NASCET criteria. No dissection or occlusion. VERTEBRAL ARTERIES: The vertebral arteries are patent from the origins to the vertebrobasilar confluence. There is mild atherosclerosis of the right V1 and  V3 segments without significant stenosis. The left vertebral artery origin is slightly obscured due to streak artifact and tortuosity of the vessel at this level. There is likely moderate osseous stenosis of the left vertebral artery. No dissection or occlusion. CTA HEAD: ANTERIOR CEREBRAL ARTERIES: The anterior cerebral arteries are patent bilaterally. No significant stenosis. No occlusion. No aneurysm. MIDDLE CEREBRAL ARTERIES: The middle cerebral arteries are patent bilaterally. No significant stenosis. No occlusion. No aneurysm. POSTERIOR CEREBRAL ARTERIES: The PCAs are patent bilaterally. No significant stenosis. No occlusion. No aneurysm. BASILAR ARTERY: There is mild irregularity of the basilar artery. Mild stenosis of the mid basilar artery.  Small AICA noted bilaterally. PICA visualized bilaterally. Superior cerebellar arteries are patent bilaterally. No aneurysm. OTHER: SOFT TISSUES: Subcentimeter bilateral thyroid  nodules. No acute finding. No masses or lymphadenopathy. BONES: Mild degenerative changes in the visualized spine. No acute osseous abnormality. VASCULATURE (Intracranial): The intracranial internal carotid arteries are patent bilaterally. Atherosclerosis of the carotid siphons, right greater than left. No high grade stenosis of the carotid arteries. VENOUS SINUSES: There are filling defects again noted within the mid and distal left transverse sinus and left sigmoid sinus extending to the jugular bulb. This is compatible with thrombus seen on prior studies. The thrombus on the current study appears reorganized and is more chronic appearing, primarily located within the central portions of the venous sinuses. The thrombus is nonocclusive. There is narrowing and moderate irregularity of the proximal left transverse sinus without significant thrombus in this region. There is 1 possible small focus of nonocclusive thrombus within the proximal left transverse sinus on series 6 image 111. There is moderate narrowing of the left sigmoid sinus. The left jugular bulb appears severely narrowed without occlusion. There are a few small caliber venous collaterals within this region. The internal jugular vein below the skull base is significantly narrowed. There is a focus of severe narrowing of the internal jugular vein as it passes posterior to the styloid process on series 6 image 159. Below this level, there are areas of mixed attenuation likely reflecting mixing of contrast opacified and non opacified blood. AORTIC ARCH/SUBCLAVIAN ARTERIES: There is minimal calcified atherosclerosis along the visualized aortic arch. The left subclavian artery has mixed atherosclerotic plaque along the proximal portion without significant stenosis. IMPRESSION: 1. No  acute large vessel occlusion. 2. 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 Moderate narrowing of the left sigmoid sinus. The left jugular bulb is severely narrowed without occlusion, with small venous collaterals noted. 3. The internal jugular vein below the skull base is diminutive in caliber with a focal severe narrowing as it passes posterior to the styloid process. 4. Additional narrowing and moderate irregularity of the proximal left transverse sinus 5. Approximately 50% stenosis at the origin of the right cervical internal carotid artery. 6. Mild irregularity and mild stenosis of the mid basilar artery. 7. Findings discussed with Dr. Matthews at 11:29 AM on 07/27/24. Electronically signed by: Donnice Mania MD 07/27/2024 11:32 AM EST RP Workstation: [id number]   CT HEAD CODE STROKE WO CONTRAST (LKW 0-4.5h, LVO 0-24h) Result Date: 07/27/2024 EXAM: CT HEAD WITHOUT CONTRAST 07/27/2024 10:43:37 AM TECHNIQUE: CT of the head was performed without the administration of intravenous contrast. Automated exposure control, iterative reconstruction, and/or weight based adjustment of the mA/kV was utilized to reduce the radiation dose to as low as reasonably achievable. COMPARISON: CT head 09/29/2023, brain MRI 09/26/2023. CLINICAL HISTORY: 68 year old male with acute neurological  deficit, stroke suspected. 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 FINDINGS: BRAIN AND VENTRICLES: No acute hemorrhage. No evidence of acute infarct. No hydrocephalus. No extra-axial collection. No mass effect or midline shift. Posterior left temporal lobe encephalomalacia now corresponding to site of intra axial hemorrhage in February. Background brain volume is stable. Stable gray white differentiation. No suspicious intracranial vascular hyperdensity. Calcified atherosclerosis at the skull base. ORBITS: No acute abnormality. SINUSES:  Paranasal sinuses, tympanic cavities and mastoids are well aerated. SOFT TISSUES AND SKULL: No acute soft tissue abnormality. No skull fracture. No gaze deviation. [place]ber[place] stroke program early CT score (aspects) ----- Ganglionic (caudate, ic, lentiform nucleus, insula, M1-m3): 7 Supraganglionic (m4-m6): 3 Total: 10 IMPRESSION: 1. Expected evolution of prior left temporal lobe hemorrhage, now iwth encephalomalacia. No acute intracranial abnormality. ASPECTS 10. 2. These results were communicated to Dr. Matthews at 10:50 on 07/27/2024 by text page via the [facility] messaging system. Electronically signed by: Helayne Hurst MD 07/27/2024 10:52 AM EST RP Workstation: [id number]    Scheduled Meds:  apixaban   5 mg Oral BID   insulin  aspart  0-5 Units Subcutaneous QHS   insulin  aspart  0-9 Units Subcutaneous TID WC   levETIRAcetam   1,000 mg Intravenous Q12H   lisinopril   5 mg Oral Daily   rosuvastatin   10 mg Oral Q supper   sodium chloride  flush  3 mL Intravenous Q12H   sodium chloride  flush  3 mL Intravenous Q12H   sodium chloride  flush  3 mL Intravenous Q12H   sodium chloride  flush  3 mL Intravenous Q12H   Continuous Infusions:  sodium chloride      sodium chloride        LOS: 1 day   Fredia Skeeter, MD [facility]  07/28/2024, 8:13 AM   *Please note that this is a verbal dictation therefore any spelling or grammatical errors are due to the Dragon Medical One system interpretation.  Please page via Amion and do not message via secure chat for urgent patient care matters. Secure chat can be used for non urgent patient care matters.  How to contact the [facility] Attending or Consulting provider 7A - 7P or covering provider during after hours 7P -7A, for this patient?  Check the care team in [facility] and look for a) attending/consulting [facility] provider listed and b) the [facility] team listed. Page or secure chat 7A-7P. Log into [email or web address] and use [place]'s universal password to access. If you do not have the  password, please contact the hospital operator. Locate the [facility] provider you are looking for under [facility] and page to a number that you can be directly reached. If you still have difficulty reaching the provider, please page the [facility] (Director on Call) for the Hospitalists listed on amion for assistance.

## 2024-07-28 NOTE — Plan of Care (Signed)

## 2024-07-28 NOTE — Progress Notes (Signed)
 NEUROLOGY CONSULT FOLLOW UP NOTE   Date of service: July 28, 2024 Patient Name: Howard Johnson MRN:  [id number] DOB:  02/02/1956  Interval Hx/subjective   Patient is awake sitting and eating breakfast  Vitals   Vitals:   07/27/24 1947 07/27/24 2310 07/28/24 0354 07/28/24 0736  BP: 130/70 112/78 108/71 107/69  Pulse: 65 62 71 81  Resp: 18 18 18 18   Temp: (!) 97.3 F (36.3 C) 98.6 F (37 C) 98 F (36.7 C) 98.4 F (36.9 C)  TempSrc: Oral Oral Oral Oral  SpO2: 98% 98% 94% 94%  Weight:      Height:         Body mass index is 26.16 kg/m.  Physical Exam   Constitutional: Appears well-developed and well-nourished.  Neurologic Examination    MS: He has a significant mixed aphasia, he uses formed words, but they do not answer the questions that I ask, he is able to follow mimed commands, but has difficulty with purely verbal commands CN: I suspect that he does have some degree of right field cut, but difficult to be certain, EOMI Motor: No drift in either upper extremity Sensory: He response to mild stimulation bilaterally  Medications Current Medications[1]  Labs and Diagnostic Imaging   Creatinine 1.66  Imaging(Personally reviewed): MRI brain: Sequelae of previous temporal hemorrhage, but no acute findings  Assessment   Howard Johnson is a 68 y.o. 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  His mental status is preserved, and I would favor treating this with antiepileptics for the time being, as opposed to aggressive sedation etc.  He is on 1 g twice daily of Keppra  already and with his GFR that is already a large dose.  I will load with lacosamide , and if no response then proceed with valproic  acid.  Recommendations  Load with lacosamide  200 mg x 1 Ativan  1mg  x 1 Continue Keppra  1 g twice daily Continue LTM  EEG  ______________________________________________________________________   Signed, Aisha Seals, MD [facility]      [1]  Current Facility-Administered Medications:    acetaminophen  (TYLENOL ) tablet 650 mg, 650 mg, Oral, Q6H PRN **OR** acetaminophen  (TYLENOL ) suppository 650 mg, 650 mg, Rectal, Q6H PRN, Emokpae, Courage, MD   apixaban  (ELIQUIS ) tablet 5 mg, 5 mg, Oral, BID, Emokpae, Courage, MD, 5 mg at 07/28/24 9095   bisacodyl  (DULCOLAX) suppository 10 mg, 10 mg, Rectal, Daily PRN, Emokpae, Courage, MD   insulin  aspart (novoLOG ) injection 0-5 Units, 0-5 Units, Subcutaneous, QHS, Emokpae, Courage, MD   insulin  aspart (novoLOG ) injection 0-9 Units, 0-9 Units, Subcutaneous, TID WC, Emokpae, Courage, MD   lacosamide  (VIMPAT ) 200 mg in sodium chloride  0.9 % 25 mL IVPB, 200 mg, Intravenous, STAT, Arielis Leonhart, Aisha SQUIBB, MD   lacosamide  (VIMPAT ) tablet 100 mg, 100 mg, Oral, BID, Ardenia Stiner, Aisha SQUIBB, MD   levETIRAcetam  (KEPPRA ) undiluted injection 1,500 mg, 1,500 mg, Intravenous, Q12H, Zierra Laroque, Aisha SQUIBB, MD   lisinopril  (ZESTRIL ) tablet 5 mg, 5 mg, Oral, Daily, Emokpae, Courage, MD, 5 mg at 07/28/24 9095   LORazepam  (ATIVAN ) injection 1 mg, 1 mg, Intravenous, Q6H PRN, Emokpae, Courage, MD   ondansetron  (ZOFRAN ) tablet 4 mg, 4 mg, Oral, Q6H PRN **OR** ondansetron  (ZOFRAN ) injection 4 mg, 4 mg, Intravenous, Q6H PRN, Emokpae, Courage, MD   polyethylene glycol (MIRALAX  / GLYCOLAX ) packet 17 g, 17 g, Oral, Daily PRN, Emokpae, Courage, MD   rosuvastatin  (CRESTOR ) tablet 10 mg, 10 mg, Oral, Q supper,  Pearlean Manus, MD, 10 mg at 07/27/24 1752   sodium chloride  flush (NS) 0.9 % injection 3 mL, 3 mL, Intravenous, Q12H, Emokpae, Courage, MD, 3 mL at 07/28/24 0905   traZODone  (DESYREL ) tablet 50 mg, 50 mg, Oral, QHS PRN, Emokpae, Courage, MD

## 2024-07-28 NOTE — Discharge Instructions (Signed)

## 2024-07-28 NOTE — TOC Initial Note (Signed)
 Transition of Care [facility]) - Initial/Assessment Note    Patient Details  Name: Howard Johnson MRN: [id number] Date of Birth: 1956/07/07  Transition of Care [facility]) CM/SW Contact:    Andrez JULIANNA George, RN Phone Number: 07/28/2024, 2:54 PM  Clinical Narrative:                 Howard Johnson is a 68 y.o. 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  Pt is from home with his spouse. Pt works but spouse is with him in the evenings/ nights.  Spouse manages his medications.  Pt drives but family can assist.   IP Care management following.  Expected Discharge Plan: Home/Self Care Barriers to Discharge: Continued Medical Work up   Patient Goals and CMS Choice            Expected Discharge Plan and Services       Living arrangements for the past 2 months: Single Family Home                                      Prior Living Arrangements/Services Living arrangements for the past 2 months: Single Family Home Lives with:: Spouse Patient language and need for interpreter reviewed:: Yes Do you feel safe going back to the place where you live?: Yes        Care giver support system in place?: Yes (comment) Current home services: DME (WC/ walker/ cane) Criminal Activity/Legal Involvement Pertinent to Current Situation/Hospitalization: No - Comment as needed  Activities of Daily Living      Permission Sought/Granted                  Emotional Assessment Appearance:: Appears stated age Attitude/Demeanor/Rapport:  (aphasic)       Psych Involvement: No (comment)  Admission diagnosis:  Aphasia [R47.01] Stage 3b chronic kidney disease (HCC) [N18.32] Patient Active Problem List   Diagnosis Date Noted   Aphasia 07/27/2024   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 [place] [address] Phone: [phone] Fax: [phone]     Social Drivers of Health (SDOH) Social History: SDOH Screenings   Depression (PHQ2-9): Low Risk (10/16/2023)  Tobacco Use: Low Risk (07/27/2024)   SDOH Interventions:     Readmission Risk Interventions     No data to display

## 2024-07-28 NOTE — Progress Notes (Signed)
 LTM maint complete - no skin breakdown under:  Fp1 Fp2 F8 Monitored by [facility]

## 2024-07-28 NOTE — Procedures (Signed)
 Patient Name: Howard Johnson  MRN: [id number]  Epilepsy Attending: Arlin MALVA Krebs  Referring Physician/Provider: Michaela Aisha SQUIBB, MD  Duration: 07/27/2024 1805 to 07/28/2024 1805  Patient history: 68 yo man with hx DVST and SAH in Feb 2025, DM2, HL, HTN who presents with acute onset severe global aphasia. EEG to evaluate for seizure  Level of alertness: Awake, asleep  AEDs during EEG study: LEV  Technical aspects: This EEG study was done with scalp electrodes positioned according to the 10-20 International system of electrode placement. Electrical activity was reviewed with band pass filter of 1-70Hz , sensitivity of 7 uV/mm, display speed of 78mm/sec with a 60Hz  notched filter applied as appropriate. EEG data were recorded continuously and digitally stored.  Video monitoring was available and reviewed as appropriate.  Description: The posterior dominant rhythm consists of 8-9 Hz activity of moderate voltage (25-35 uV) seen predominantly in posterior head regions, asymmetric ( left<right) and reactive to eye opening and eye closing.  Sleep was characterized by vertex waves, sleep spindles (12 to 14 Hz), maximal frontocentral region.  EEG showed continuous 3-5 hz theta- delta slowing in left hemisphere, maximal left temporal region.  Lateralized periodic discharges were noted in left hemisphere, maximal left temporal region at 0.5 to 1 Hz.  Hyperventilation and photic stimulation were not performed.     Seizures without clinical signs were noted during the study arising from the left hemisphere, maximal left temporal region.  During the seizure EEG showed lateralized periodic discharges with overriding fast activity in left hemisphere, maximal left temporal region which evolved in frequency to 2 to 2.5 Hz, and appeared more sharply contoured with overlying rhythmicity.  EEG then evolved into 3 to 5 Hz theta-delta slowing admixed with 13-15hz  beta activity.  At the beginning of the study, 1-2  seizures were noted per hour lasting about a minute each.  Gradually the frequency of seizures worsened and average 9-10 seizures were noted per hour, lasting about 15 to 20 seconds each.  After around midnight, EEG worsened and showed frequent seizures arising from left hemisphere, maximal left temporal region followed by brief periods of attenuation.  This EEG pattern is consistent with electrographic status epilepticus. Gradually as medications were adjusted, status epilepticus resolved.  ABNORMALITY - Lateralized periodic discharge ( LPD ) Left hemisphere, maximal left temporal region - Seizures without clinical signs, left temporal region - Electrographic status epilepticus, left temporal region - Continuous slow, left hemisphere, maximal left temporal region  IMPRESSION: At the beginning of the study, EEG showed evidence of epileptogenicity arising from left hemisphere, maximal left temporal region. Gradually EEG worsened and showed seizures without clinical signs arising from left temporal region, average 9-10 seizures per hour lasting about 15 to 20 seconds each. After around midnight, frequency of seizures worsened and EEG showed electrographic status epilepticus arising from the left temporal region. Gradually as medications were adjusted, status epilepticus resolved. EEG continued to show evidence of cortical dysfunction in left hemisphere, maximal left temporal region, likely secondary to underlying structural abnormality, seizures.  Dr. Michaela was notified.   Baelyn Doring O Melanie Openshaw

## 2024-07-29 DIAGNOSIS — I69398 Other sequelae of cerebral infarction: Secondary | ICD-10-CM

## 2024-07-29 DIAGNOSIS — R4701 Aphasia: Secondary | ICD-10-CM | POA: Diagnosis not present

## 2024-07-29 DIAGNOSIS — R569 Unspecified convulsions: Secondary | ICD-10-CM | POA: Diagnosis not present

## 2024-07-29 DIAGNOSIS — G40909 Epilepsy, unspecified, not intractable, without status epilepticus: Secondary | ICD-10-CM

## 2024-07-29 LAB — GLUCOSE, CAPILLARY
Glucose-Capillary: 99 mg/dL (ref 70–99)
Glucose-Capillary: 161 mg/dL — ABNORMAL HIGH (ref 70–99)
Glucose-Capillary: 91 mg/dL (ref 70–99)
Glucose-Capillary: 122 mg/dL — ABNORMAL HIGH (ref 70–99)

## 2024-07-29 MED ORDER — LACOSAMIDE 200 MG PO TABS
200.0000 mg | ORAL_TABLET | Freq: Two times a day (BID) | ORAL | Status: DC
  Administered 2024-07-29 – 2024-08-05 (×14): 200 mg via ORAL
  Filled 2024-07-29 (×14): qty 1

## 2024-07-29 MED ORDER — LACOSAMIDE 50 MG PO TABS: 150.0000 mg | ORAL_TABLET | Freq: Two times a day (BID) | ORAL | Status: DC

## 2024-07-29 MED ORDER — LACOSAMIDE 50 MG PO TABS
50.0000 mg | ORAL_TABLET | Freq: Once | ORAL | Status: AC
  Administered 2024-07-29: 12:00:00 50 mg via ORAL
  Filled 2024-07-29: qty 1

## 2024-07-29 NOTE — Plan of Care (Signed)

## 2024-07-29 NOTE — Procedures (Signed)
 Patient Name: Howard Johnson  MRN: [id number]  Epilepsy Attending: Arlin MALVA Krebs  Referring Physician/Provider: Michaela Aisha SQUIBB, MD  Duration: 07/28/2024 1805 to 07/29/2024 1805  Patient history: 68 yo man with hx DVST and SAH in Feb 2025, DM2, HL, HTN who presents with acute onset severe global aphasia. EEG to evaluate for seizure  Level of alertness: Awake, asleep  AEDs during EEG study: LEV, LCM, VPA  Technical aspects: This EEG study was done with scalp electrodes positioned according to the 10-20 International system of electrode placement. Electrical activity was reviewed with band pass filter of 1-70Hz , sensitivity of 7 uV/mm, display speed of 40mm/sec with a 60Hz  notched filter applied as appropriate. EEG data were recorded continuously and digitally stored.  Video monitoring was available and reviewed as appropriate.  Description: The posterior dominant rhythm consists of 8-9 Hz activity of moderate voltage (25-35 uV) seen predominantly in posterior head regions, asymmetric ( left<right) and reactive to eye opening and eye closing.  Sleep was characterized by vertex waves, sleep spindles (12 to 14 Hz), maximal frontocentral region.  EEG showed continuous 3-5 hz theta- delta slowing in left hemisphere, maximal left temporal region.  Lateralized periodic discharges were noted in left hemisphere, maximal left temporal region at 0.5 to 1 Hz, with overlying rhythmicity and fast activity.  Hyperventilation and photic stimulation were not performed.     Seizures without clinical signs were noted during the study arising from the left hemisphere, maximal left temporal region.  During the seizure EEG showed lateralized periodic discharges with overriding fast activity in left hemisphere, maximal left temporal region which evolved in frequency to 2 to 2.5 Hz, and appeared more sharply contoured with overlying rhythmicity.  EEG then evolved into 3 to 5 Hz theta-delta slowing admixed with 13-15hz   beta activity. Seizures were noted on 07/28/2024 at 2209 and on 07/29/2024 at 0010, 0201, 0224, 0303, 0504, 0542, 1522 and 1544. Duration of seizures was about 10 seconds to 1 minute.   ABNORMALITY - Lateralized periodic discharge with overriding fast activity and rhythmicity ( LPD+F+R ) Left hemisphere, maximal left temporal region - Seizures without clinical signs, left temporal region - Continuous slow, left hemisphere, maximal left temporal region  IMPRESSION: Thi study showed seizures without clinical signs arising from left temporal region. Seizures were noted on 07/28/2024 at 2209 and on 07/29/2024 at 0010, 0201, 0224, 0303, 0504, 0542, 1522 and 1544 lasting for about 10 seconds to 1 minute. EEG also showed evidence of epileptogenicity arising from left hemisphere, maximal left temporal region.  This EEG pattern is on the ictal operated to continue with increased risk of seizure recurrence.  Lastly there is cortical dysfunction in left hemisphere, maximal left temporal region likely secondary to underlying structural abnormality, seizures.   [name]

## 2024-07-29 NOTE — Hospital Course (Signed)
 Howard Johnson is a 68 y.o. male with a history of left temporal lobe intracranial bleeding, cerebral venous thrombosis, diabetes mellitus type 2, hypertension, CKD stage IIIa, hyperlipidemia.  Patient presented secondary to aphasia with concern for stroke.  Stroke workup was negative however seizure workup revealed evidence of status epilepticus.  Neurology started patient on antiepileptic drug therapy for management and are currently titrating medications.

## 2024-07-29 NOTE — Progress Notes (Signed)
 PROGRESS NOTE    Howard Johnson  FMW:[id number] DOB: [birth year] DOA: 07/27/2024 PCP: Shona Norleen PEDLAR, MD   Brief Narrative: Howard Johnson is a 68 y.o. male with a history of left temporal lobe intracranial bleeding, cerebral venous thrombosis, diabetes mellitus type 2, hypertension, CKD stage IIIa, hyperlipidemia.  Patient presented secondary to aphasia with concern for stroke.  Stroke workup was negative however seizure workup revealed evidence of status epilepticus.  Neurology started patient on antiepileptic drug therapy for management and are currently titrating medications.   Assessment and Plan:  Status epilepticus Patient noted to have very frequent seizures while on LTM EEG during workup for aphasia.  Neurology have started him on Keppra , Vimpat  and valproate for management.  Patient still with seizures overnight. -Neurology recommendations: Continue valproate and Keppra ; increase to Vimpat  150 mg daily  Aphasia [place] Secondary to uncontrolled seizures.  History of left temporal intracranial bleeding History of cerebral venous thrombosis Noted. Prior to arrival medication(s) include Eliquis . -Continue Eliquis   Diabetes mellitus type 2 Well controlled based on hemoglobin A1C of 5.7%. prior to arrival medication(s) include semaglutide although is possibly transitioned to Lantus . Unlikely that patient needs insulin  therapy for his current A1C level. -Continue SSI  CKD stage IIIa Baseline creatinine appears to be between 1.5 to 1.7. Creatinine of 1.66 on admission and slightly up to 1.8 today. -BMP in AM  Primary hypertension Prior to arrival medication(s) include lisinopril . -Continue lisinopril   Hyperlipidemia Prior to arrival medication(s) include Crestor . -Continue Crestor     DVT prophylaxis: Eliquis  Code Status:   Code Status: Limited: Do not attempt resuscitation (DNR) -DNR-LIMITED -Do Not Intubate/DNI  Family Communication: None at bedside Disposition Plan:  Discharge pending ongoing neurology recommendations/management   Consultants:  Neurology  Procedures:  LTM EEG  Antimicrobials: None    Subjective: Difficult to ascertain secondary to aphasia.  It seems patient is hoping to be discharged home.  Objective: BP (!) 98/56 (BP Location: Left Arm)   Pulse 74   Temp 98.4 F (36.9 C) (Oral)   Resp 16   Ht 6' 4 (1.93 m)   Wt 97.5 kg   SpO2 96%   BMI 26.16 kg/m   Examination:  General exam: Appears calm and comfortable. Respiratory system: Clear to auscultation. Respiratory effort normal. Cardiovascular system: S1 & S2 heard, RRR. Gastrointestinal system: Abdomen is nondistended, soft and nontender. Normal bowel sounds heard. Central nervous system: Alert.  Aphasia. Musculoskeletal: No edema. No calf tenderness   Data Reviewed: I have personally reviewed following labs and imaging studies  CBC Lab Results  Component Value Date   WBC 10.0 07/27/2024   RBC 5.99 (H) 07/27/2024   HGB 17.3 (H) 07/27/2024   HCT 51.0 07/27/2024   MCV 87.0 07/27/2024   MCH 27.9 07/27/2024   PLT 379 07/27/2024   MCHC 32.1 07/27/2024   RDW 13.8 07/27/2024   LYMPHSABS 0.7 07/27/2024   MONOABS 0.6 07/27/2024   EOSABS 0.1 07/27/2024   BASOSABS 0.1 07/27/2024     Last metabolic panel Lab Results  Component Value Date   NA 141 07/27/2024   K 4.0 07/27/2024   CL 104 07/27/2024   CO2 21 (L) 07/27/2024   BUN 31 (H) 07/27/2024   CREATININE 1.80 (H) 07/27/2024   GLUCOSE 128 (H) 07/27/2024   GFRNONAA 44 (L) 07/27/2024   CALCIUM  9.2 07/27/2024   PHOS 3.3 09/27/2023   PROT 7.1 07/27/2024   ALBUMIN 4.8 07/27/2024   LABGLOB 2.2 11/23/2020   AGRATIO 2.2 11/23/2020   BILITOT 0.5  07/27/2024   ALKPHOS 96 07/27/2024   AST 24 07/27/2024   ALT 21 07/27/2024   ANIONGAP 14 07/27/2024    GFR: Estimated Creatinine Clearance: 47.6 mL/min (A) (by C-G formula based on SCr of 1.8 mg/dL (H)).  No results found for this or any previous visit (from  the past 240 hours).    Radiology Studies: Overnight EEG with video Result Date: 07/28/2024 Shelton Arlin KIDD, MD     07/28/2024  9:13 AM Patient Name: Howard Johnson MRN: [id number] Epilepsy Attending: Arlin KIDD Shelton Referring Physician/Provider: Michaela Aisha SQUIBB, MD Duration: 07/27/2024 1805 to 07/28/2024 0910 Patient history: 68 yo man with hx DVST and SAH in Feb 2025, DM2, HL, HTN who presents with acute onset severe global aphasia. EEG to evaluate for seizure Level of alertness: Awake, asleep AEDs during EEG study: LEV Technical aspects: This EEG study was done with scalp electrodes positioned according to the 10-20 International system of electrode placement. Electrical activity was reviewed with band pass filter of 1-70Hz , sensitivity of 7 uV/mm, display speed of 55mm/sec with a 60Hz  notched filter applied as appropriate. EEG data were recorded continuously and digitally stored.  Video monitoring was available and reviewed as appropriate. Description: The posterior dominant rhythm consists of 8-9 Hz activity of moderate voltage (25-35 uV) seen predominantly in posterior head regions, asymmetric ( left<right) and reactive to eye opening and eye closing.  Sleep was characterized by vertex waves, sleep spindles (12 to 14 Hz), maximal frontocentral region.  EEG showed continuous 3-5 hz theta- delta slowing in left hemisphere, maximal left temporal region.  Lateralized periodic discharges were noted in left hemisphere, maximal left temporal region at 0.5 to 1 Hz.  Hyperventilation and photic stimulation were not performed.   Seizures without clinical signs were noted during the study arising from the left hemisphere, maximal left temporal region.  During the seizure EEG showed lateralized periodic discharges with overriding fast activity in left hemisphere, maximal left temporal region which evolved in frequency to 2 to 2.5 Hz, and appeared more sharply contoured with overlying rhythmicity.  EEG then  evolved into 3 to 5 Hz theta-delta slowing admixed with 13-15hz  beta activity.  At the beginning of the study, 1-2 seizures were noted per hour lasting about a minute each.  Gradually the frequency of seizures worsened and average 9-10 seizures were noted per hour, lasting about 15 to 20 seconds each.  After around midnight, EEG worsened and showed frequent seizures arising from left hemisphere, maximal left temporal region followed by brief periods of attenuation.  This EEG pattern is consistent with electrographic status epilepticus. ABNORMALITY - Lateralized periodic discharge ( LPD ) Left hemisphere, maximal left temporal region - Seizures without clinical signs, left temporal region - Electrographic status epilepticus, left temporal region - Continuous slow, left hemisphere, maximal left temporal region IMPRESSION: At the beginning of the study, EEG showed evidence of epileptogenicity arising from left hemisphere, maximal left temporal region.  Gradually EEG worsened and showed seizures without clinical signs arising from left temporal region, average 9-10 seizures per hour lasting about 15 to 20 seconds each. After around midnight, frequency of seizures worsened and EEG showed electrographic status epilepticus arising from the left temporal region. Additionally, EEG showed evidence of cortical dysfunction in left hemisphere, maximal left temporal region, likely secondary to underlying structural abnormality, seizures. Dr. Michaela was notified. Arlin KIDD Shelton   MR BRAIN WO CONTRAST Result Date: 07/27/2024 EXAM: MRI BRAIN WITHOUT CONTRAST 07/27/2024 12:30:49 PM TECHNIQUE: Multiplanar multisequence MRI of the head/brain  was performed without the administration of intravenous contrast. COMPARISON: CT head and CTA head and neck 07/27/2024. Prior brain MRI 09/26/2023. CLINICAL HISTORY: 68 year old male with acute neurological deficit, stroke suspected. 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 FINDINGS: BRAIN AND VENTRICLES: No mass. No midline shift. No hydrocephalus. The sella is unremarkable. Major arterial flow voids appear stable, preserved. Chronically abnormal left transverse sinus (series 8 image 8) and left sigmoid sinus (series 8 image 4) vascular flow voids which do not appear significantly changed from the February MRI. Posterior left temporal lobe encephalomalacia corresponding to previous intra axial hematoma site. Chronic hemosiderin there appears regressed on SWI compared to February. No new chronic cerebral blood products. No acute infarct. Stable gray and white matter signal elsewhere including patchy periventricular and other mild scattered nonspecific white matter T2 and FLAIR hyperintensity. Deep gray nuclei, brainstem, and cerebellum appear negative. Negative visible cervical spine. ORBITS: No acute abnormality. SINUSES AND MASTOIDS: Paranasal sinuses and mastoids are well aerated. BONES AND SOFT TISSUES: Normal marrow signal. No acute soft tissue abnormality. IMPRESSION: 1. No acute intracranial abnormality. 2. Expected evolution left temporal lobe hematoma since February. Stable abnormal left transverse and sigmoid venous sinuses. Electronically signed by: Helayne Hurst MD 07/27/2024 12:50 PM EST RP Workstation: [id number]   CT ANGIO HEAD NECK W WO CM (CODE STROKE) Result Date: 07/27/2024 EXAM: CTA Head and Neck with Intravenous Contrast. CT Head without Contrast. CLINICAL HISTORY: Neuro deficit, acute, stroke suspected. TECHNIQUE: Axial CTA images of the head and neck performed with intravenous contrast. MIP reconstructed images were created and reviewed. Axial computed tomography images of the head/brain performed without intravenous contrast. Note: Per PQRS, the description of internal carotid artery percent stenosis, including 0 percent or normal exam, is based on [place] American Symptomatic Carotid Endarterectomy Trial (NASCET) criteria. Dose reduction technique  was used including one or more of the following: automated exposure control, adjustment of mA and kV according to patient size, and/or iterative reconstruction. CONTRAST: Without and with; 75 mL (iohexol  (OMNIPAQUE ) 350 MG/ML injection 75 mL IOHEXOL  350 MG/ML SOLN). COMPARISON: CT head CT venogram and CTA head 09/26/2023. FINDINGS: CTA NECK: COMMON CAROTID ARTERIES: The right carotid artery is patent from the origin to the skull base. There is calcified atherosclerosis at the right carotid bifurcation. The left carotid artery is patent from the origin to the skull base. Calcified atherosclerosis at the left carotid bifurcation without hemodynamically significant stenosis. No dissection or occlusion. INTERNAL CAROTID ARTERIES: There is approximately 50% stenosis at the origin of the right cervical ICA. No stenosis by NASCET criteria. No dissection or occlusion. VERTEBRAL ARTERIES: The vertebral arteries are patent from the origins to the vertebrobasilar confluence. There is mild atherosclerosis of the right V1 and V3 segments without significant stenosis. The left vertebral artery origin is slightly obscured due to streak artifact and tortuosity of the vessel at this level. There is likely moderate osseous stenosis of the left vertebral artery. No dissection or occlusion. CTA HEAD: ANTERIOR CEREBRAL ARTERIES: The anterior cerebral arteries are patent bilaterally. No significant stenosis. No occlusion. No aneurysm. MIDDLE CEREBRAL ARTERIES: The middle cerebral arteries are patent bilaterally. No significant stenosis. No occlusion. No aneurysm. POSTERIOR CEREBRAL ARTERIES: The PCAs are patent bilaterally. No significant stenosis. No occlusion. No aneurysm. BASILAR ARTERY: There is mild irregularity of the basilar artery. Mild stenosis of the mid basilar artery. Small AICA noted bilaterally. PICA visualized bilaterally. Superior cerebellar arteries are patent bilaterally. No aneurysm. OTHER: SOFT TISSUES: Subcentimeter  bilateral thyroid   nodules. No acute finding. No masses or lymphadenopathy. BONES: Mild degenerative changes in the visualized spine. No acute osseous abnormality. VASCULATURE (Intracranial): The intracranial internal carotid arteries are patent bilaterally. Atherosclerosis of the carotid siphons, right greater than left. No high grade stenosis of the carotid arteries. VENOUS SINUSES: There are filling defects again noted within the mid and distal left transverse sinus and left sigmoid sinus extending to the jugular bulb. This is compatible with thrombus seen on prior studies. The thrombus on the current study appears reorganized and is more chronic appearing, primarily located within the central portions of the venous sinuses. The thrombus is nonocclusive. There is narrowing and moderate irregularity of the proximal left transverse sinus without significant thrombus in this region. There is 1 possible small focus of nonocclusive thrombus within the proximal left transverse sinus on series 6 image 111. There is moderate narrowing of the left sigmoid sinus. The left jugular bulb appears severely narrowed without occlusion. There are a few small caliber venous collaterals within this region. The internal jugular vein below the skull base is significantly narrowed. There is a focus of severe narrowing of the internal jugular vein as it passes posterior to the styloid process on series 6 image 159. Below this level, there are areas of mixed attenuation likely reflecting mixing of contrast opacified and non opacified blood. AORTIC ARCH/SUBCLAVIAN ARTERIES: There is minimal calcified atherosclerosis along the visualized aortic arch. The left subclavian artery has mixed atherosclerotic plaque along the proximal portion without significant stenosis. IMPRESSION: 1. No acute large vessel occlusion. 2. 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 Moderate narrowing of the left sigmoid sinus. The left jugular bulb is severely narrowed without occlusion, with small venous collaterals noted. 3. The internal jugular vein below the skull base is diminutive in caliber with a focal severe narrowing as it passes posterior to the styloid process. 4. Additional narrowing and moderate irregularity of the proximal left transverse sinus 5. Approximately 50% stenosis at the origin of the right cervical internal carotid artery. 6. Mild irregularity and mild stenosis of the mid basilar artery. 7. Findings discussed with Dr. Matthews at 11:29 AM on 07/27/24. Electronically signed by: Donnice Mania MD 07/27/2024 11:32 AM EST RP Workstation: [id number]   CT HEAD CODE STROKE WO CONTRAST (LKW 0-4.5h, LVO 0-24h) Result Date: 07/27/2024 EXAM: CT HEAD WITHOUT CONTRAST 07/27/2024 10:43:37 AM TECHNIQUE: CT of the head was performed without the administration of intravenous contrast. Automated exposure control, iterative reconstruction, and/or weight based adjustment of the mA/kV was utilized to reduce the radiation dose to as low as reasonably achievable. COMPARISON: CT head 09/29/2023, brain MRI 09/26/2023. CLINICAL HISTORY: 68 year old male with acute neurological deficit, stroke suspected. 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 FINDINGS: BRAIN AND VENTRICLES: No acute hemorrhage. No evidence of acute infarct. No hydrocephalus. No extra-axial collection. No mass effect or midline shift. Posterior left temporal lobe encephalomalacia now corresponding to site of intra axial hemorrhage in February. Background brain volume is stable. Stable gray white differentiation. No suspicious intracranial vascular hyperdensity. Calcified atherosclerosis at the skull base. ORBITS: No acute abnormality. SINUSES: Paranasal sinuses, tympanic cavities and mastoids are well aerated. SOFT TISSUES AND SKULL: No acute soft tissue abnormality. No skull fracture. No gaze  deviation. [place] stroke program early CT score (aspects) ----- Ganglionic (caudate, ic, lentiform nucleus, insula, M1-m3): 7 Supraganglionic (m4-m6): 3 Total: 10 IMPRESSION: 1. Expected evolution of prior left temporal  lobe hemorrhage, now iwth encephalomalacia. No acute intracranial abnormality. ASPECTS 10. 2. These results were communicated to Dr. Matthews at 10:50 on 07/27/2024 by text page via the [facility] messaging system. Electronically signed by: Helayne Hurst MD 07/27/2024 10:52 AM EST RP Workstation: [id number]      LOS: 2 days    Elgin Lam, MD [facility] 07/29/2024, 8:04 AM   If 7PM-7AM, please contact night-coverage [email or web address]

## 2024-07-29 NOTE — Progress Notes (Signed)
° °  Brief Progress Note   _____________________________________________________________________________________________________________  Patient Name: Howard Johnson Patient DOB: Aug 18, 1955 Date: @TODAY @  No Barriers noted at this time for d/c on 07/30/2024 _____________________________________________________________________________________________________________  [facility] RN Expeditor Ronal DELENA Bald Please contact us  directly via secure chat (search for [facility]) or by calling us  at [phone] [facility]).

## 2024-07-29 NOTE — Progress Notes (Signed)
 Subjective: NAEO. Son and wife at bedside. Still aphasic  ROS: Unable to obtain due to aphasia  Examination  Vital signs in last 24 hours: Temp:  [97.7 F (36.5 C)-98.5 F (36.9 C)] 98.4 F (36.9 C) (12/17 0737) Pulse Rate:  [71-99] 74 (12/17 0737) Resp:  [16-19] 16 (12/17 0737) BP: (98-151)/(56-96) 151/96 (12/17 0947) SpO2:  [92 %-96 %] 96 % (12/17 0737)  General: lying in bed, NAD Neuro: awake, alert, aphasic so unable to follow commands and doesn't answer question, can mimic, PERLA, EOMI,unable to assess visual fields, moving all extremities   Basic Metabolic Panel: Recent Labs  Lab 07/27/24 1030 07/27/24 1039  NA 140 141  K 4.1 4.0  CL 104 104  CO2 21*  --   GLUCOSE 133* 128*  BUN 28* 31*  CREATININE 1.66* 1.80*  CALCIUM  9.2  --     CBC: Recent Labs  Lab 07/27/24 1030 07/27/24 1039  WBC 10.0  --   NEUTROABS 8.4*  --   HGB 16.7 17.3*  HCT 52.1* 51.0  MCV 87.0  --   PLT 379  --      Coagulation Studies: Recent Labs    07/27/24 1030  LABPROT 16.3*  INR 1.2    Imaging personally reviewed  MR brain wo contrast 07/27/2024: No acute intracranial abnormality.  Expected evolution left temporal lobe hematoma since February. Stable abnormal left transverse and sigmoid venous sinuses.  ASSESSMENT AND PLAN: 68 y.o. 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   Status epilepticus, resolved Seizures due to underlying stroke - Per family bedside, patient had did not have any aphasia after stroke.  Unfortunately currently patient still very aphasic.  He did have some seizures overnight.  Therefore I will increase his Vimpat  to 150 mg twice daily with plan to increase to 200 mg twice daily tonight if seizures persist - Continue current dose of Keppra  and Depakote - Continue LTM EEG while we adjust anti-seizure medication -continue seizure precautions - Rest per primary team - Discussed  plan with hospitalist via secure chat - Discussed plan with family at bedside    I personally spent a total of 40 minutes in the care of the patient today including getting/reviewing separately obtained history, performing a medically appropriate exam/evaluation, counseling and educating, placing orders, referring and communicating with other health care professionals, documenting clinical information in the EHR, independently interpreting results, and coordinating care.         Arlin Krebs Epilepsy [facility] For questions after 5pm please refer to AMION to reach the Neurologist on call

## 2024-07-29 NOTE — Evaluation (Signed)
 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 MRI brain showed no new acute abnormalities, expected evolution of left temporal lobe hematoma since February 2025 noted.  Status epilepticus observed on EEG. Patient seen in OP ST with last session in April 2025. Session note reported The [place] Aphasia Screening Test was re-administered this date and he achieved a 50/50 on the expressive portion (initial 8/50) and 48/50 on the receptive portion (initial 16/50).   Assessment / Plan / Recommendation Clinical Impression  Patient was evaluated via informal measures to assess cognitive linguistic skills. Patient with severe expressive/receptive aphasia. Expression is characterized by perseveration's, phonemic paraphasias and instances of language of confusion. Patient able to independently recall name, however  required mod verbal A to verbalize numbers 1-10 and days of the week. Unable to recall months of the year despite totalA.  Patient required total A to name functional objects with increased frustration during confrontational naming task. Patient repeated single syllable words, however with phonemic paraphasias during multisyllabic words. Patient with limited success identifying objects in a FO2. Receptive language is characterized by 40% accuracy during biographical and environmental yes/no questions and 0% accuracy following single step commands. Unable to assess cognition due to severity of language deficits. Recommend skilled ST targeting language skills for >3 hours a day.    SLP Assessment  SLP Recommendation/Assessment: Patient needs continued Speech Language Pathology Services SLP Visit Diagnosis: Aphasia (R47.01);Cognitive communication deficit (R41.841)     Assistance Recommended at Discharge  Frequent or constant Supervision/Assistance  Functional Status Assessment Patient has had a recent decline in their functional status and demonstrates the ability to make significant improvements in function in a reasonable and predictable amount of time.  Frequency and Duration min 3x week  2 weeks      SLP Evaluation Cognition  Overall Cognitive Status: Difficult to assess (due to language impairments) Orientation Level: Oriented to person Attention: Focused;Sustained Focused Attention: Appears intact Sustained Attention: Appears intact       Comprehension  Auditory Comprehension Overall Auditory Comprehension: Impaired Yes/No Questions: Impaired Basic Biographical Questions: 26-50% accurate Basic Immediate Environment Questions: 25-49% accurate Commands: Impaired One Step Basic Commands: 0-24% accurate    Expression Expression Primary Mode of Expression: Verbal Verbal Expression Overall Verbal Expression: Impaired Automatic Speech: Name;Social Response;Counting;Day of week;Month  of year Level of Generative/Spontaneous Verbalization: Phrase Repetition: Impaired Level of  Impairment: Word level Naming: Impairment Confrontation: Impaired Verbal Errors: Phonemic paraphasias;Neologisms;Perseveration;Language of confusion Effective Techniques: Semantic cues;Phonemic cues;Written cues   Oral / Motor  Motor Speech Overall Motor Speech: Impaired Respiration: Within functional limits Phonation: Normal Resonance: Within functional limits Articulation: Within functional limitis Intelligibility: Intelligible Motor Planning: Impaired (questionable apraxic component, however aphasia > apraxia)            Tenesia Escudero M.A., CCC-SLP 07/29/2024, 2:45 PM

## 2024-07-30 DIAGNOSIS — N1831 Chronic kidney disease, stage 3a: Secondary | ICD-10-CM | POA: Diagnosis not present

## 2024-07-30 DIAGNOSIS — I10 Essential (primary) hypertension: Secondary | ICD-10-CM | POA: Diagnosis not present

## 2024-07-30 DIAGNOSIS — E1122 Type 2 diabetes mellitus with diabetic chronic kidney disease: Secondary | ICD-10-CM | POA: Diagnosis not present

## 2024-07-30 DIAGNOSIS — R569 Unspecified convulsions: Secondary | ICD-10-CM | POA: Diagnosis not present

## 2024-07-30 DIAGNOSIS — G40909 Epilepsy, unspecified, not intractable, without status epilepticus: Secondary | ICD-10-CM | POA: Diagnosis not present

## 2024-07-30 DIAGNOSIS — I69398 Other sequelae of cerebral infarction: Secondary | ICD-10-CM | POA: Diagnosis not present

## 2024-07-30 DIAGNOSIS — R4701 Aphasia: Secondary | ICD-10-CM | POA: Diagnosis not present

## 2024-07-30 LAB — BASIC METABOLIC PANEL WITH GFR
Anion gap: 11 (ref 5–15)
BUN: 33 mg/dL — ABNORMAL HIGH (ref 8–23)
CO2: 23 mmol/L (ref 22–32)
Calcium: 9.7 mg/dL (ref 8.9–10.3)
Chloride: 105 mmol/L (ref 98–111)
Creatinine, Ser: 1.79 mg/dL — ABNORMAL HIGH (ref 0.61–1.24)
GFR, Estimated: 40 mL/min — ABNORMAL LOW (ref >60)
Glucose, Bld: 133 mg/dL — ABNORMAL HIGH (ref 70–99)
Potassium: 3.8 mmol/L (ref 3.5–5.1)
Sodium: 139 mmol/L (ref 135–145)

## 2024-07-30 LAB — GLUCOSE, CAPILLARY
Glucose-Capillary: 103 mg/dL — ABNORMAL HIGH (ref 70–99)
Glucose-Capillary: 108 mg/dL — ABNORMAL HIGH (ref 70–99)
Glucose-Capillary: 123 mg/dL — ABNORMAL HIGH (ref 70–99)
Glucose-Capillary: 119 mg/dL — ABNORMAL HIGH (ref 70–99)

## 2024-07-30 MED ORDER — CLONAZEPAM 0.5 MG PO TABS
0.5000 mg | ORAL_TABLET | Freq: Three times a day (TID) | ORAL | Status: DC
  Administered 2024-07-30 – 2024-07-31 (×4): 0.5 mg via ORAL
  Filled 2024-07-30 (×4): qty 1

## 2024-07-30 NOTE — Evaluation (Signed)
 Physical Therapy Evaluation Patient Details Name: Howard Johnson MRN: [id number] DOB: [birth year] Today's Date: 07/30/2024  History of Present Illness  68 y.o. male presents to [facility] on 07/29/2024 with acute onset severe global aphasia. Imaging negative for acute findings, expected evolution left temporal lobe hematoma since February. Multiple seizures noted on LTM EEG. PMHx: L temporal ICH, cerebral venous thrombosis, T2DM, HTN, CKD III, and HLD.   Clinical Impression  Pt admitted with above diagnosis. Examination limited by impaired communication, lack of family/caregiver present, and pt connected to EEG. Per Chart Review of outpatient therapy notes in March 2025, pt was independent with functional mobility without an AD, completing yard work, independent with ADLs, and independent with some IADLs. He lives with his wife and son in a one story house with 2 STE. Pt currently with functional limitations due to the deficits listed below (see PT Problem List). He performed bed mobility with supervision and required CGA for transfers and gait without an AD. Utilized +2 assist for safety with equipment management d/t EEG line and IV pole. Pt mobilized back and forth within his room. He voiced frustration around being confined. Pt is primarily limited by decreased cognition and impaired communication. Pt will benefit from acute skilled PT to increase his independence and safety with mobility to allow discharge. Recommend OPPT to maximize independence and optimize safety with functional mobility.     If plan is discharge home, recommend the following: A little help with walking and/or transfers;A little help with bathing/dressing/bathroom;Assistance with cooking/housework;Assist for transportation;Help with stairs or ramp for entrance;Supervision due to cognitive status;Direct supervision/assist for medications management   Can travel by private vehicle        Equipment Recommendations None recommended by PT   Recommendations for Other Services       Functional Status Assessment Patient has had a recent decline in their functional status and demonstrates the ability to make significant improvements in function in a reasonable and predictable amount of time.     Precautions / Restrictions Precautions Precautions: Fall Recall of Precautions/Restrictions: Intact Restrictions Weight Bearing Restrictions Per Provider Order: No      Mobility  Bed Mobility Overal bed mobility: Needs Assistance Bed Mobility: Supine to Sit     Supine to sit: Supervision, HOB elevated, Used rails     General bed mobility comments: Pt sat up on L side of bed with increased time. He brought BLE off EOB. Elevated trunk and scooted fwd with rail.    Transfers Overall transfer level: Needs assistance Equipment used: None Transfers: Sit to/from Stand, Bed to chair/wheelchair/BSC Sit to Stand: Contact guard assist   Step pivot transfers: Contact guard assist, +2 safety/equipment (EEG and IV pole)       General transfer comment: Pt stood from lowest bed height and recliner chair. He pushed up with BUE support. Powered up without physical assist. Good eccentric control.    Ambulation/Gait Ambulation/Gait assistance: Contact guard assist, +2 safety/equipment (EEG and IV pole) Gait Distance (Feet): 15 Feet (2x10, standing rest, 1x15, brief seated rest, 1x15) Assistive device: None Gait Pattern/deviations: Step-through pattern, Decreased stride length Gait velocity: reduced     General Gait Details: Pt ambulated with slow steady steps, even weight shift, and good foot clearence. Reduced arm swing. Cues for navigating the tight environment and to increase safety awareness of lines/leads. Pt frustrated at the limited distance he was able to go.  Stairs            Psychologist, [employer] [facility]  Tilt Bed    Modified Rankin (Stroke Patients Only)       Balance Overall balance assessment: Needs  assistance Sitting-balance support: No upper extremity supported, Feet supported Sitting balance-Leahy Scale: Fair Sitting balance - Comments: Pt sat EOB with close supervision   Standing balance support: During functional activity, No upper extremity supported Standing balance-Leahy Scale: Fair Standing balance comment: Pt ambulated with CGA.                             Pertinent Vitals/Pain Pain Assessment Pain Assessment: No/denies pain    Home Living Family/patient expects to be discharged to:: Private residence Living Arrangements: Spouse/significant other;Children Available Help at Discharge: Family Type of Home: House Home Access: Stairs to enter   Entrance Stairs-Number of Steps: 2   Home Layout: One level   Additional Comments: Home set-up obtain per chart review from prior admission in Feb 2025.    Prior Function Prior Level of Function : Patient poor historian/Family not available;Independent/Modified Independent             Mobility Comments: Per Chart Review, OPPT Eval on 11/07/23 pt was walking 2 miles every day with his son, mowing the yard, ambulating, and navigating stairs. He denied fall hx. Scored a 23/24 on the DGI. ADLs Comments: Per Char Review, OPOT Eval on 10/16/23 pt was independent with all ADLs and majority of IADLs despite driving, cooking, and medication management.     Extremity/Trunk Assessment   Upper Extremity Assessment Upper Extremity Assessment: Defer to OT evaluation    Lower Extremity Assessment Lower Extremity Assessment: Difficult to assess due to impaired cognition;Overall WFL for tasks assessed (Pt unable to follow commands for MMT)    Cervical / Trunk Assessment Cervical / Trunk Assessment: Normal  Communication   Communication Communication: Impaired Factors Affecting Communication: Difficulty expressing self;Reduced clarity of speech (severe expressive/receptive aphasia)    Cognition Arousal: Alert Behavior  During Therapy: WFL for tasks assessed/performed   PT - Cognitive impairments: Difficult to assess, No family/caregiver present to determine baseline Difficult to assess due to: Impaired communication                       Following commands: Impaired Following commands impaired: Follows one step commands with increased time     Cueing Cueing Techniques: Verbal cues, Gestural cues     General Comments General comments (skin integrity, edema, etc.): VSS on RA    Exercises     Assessment/Plan    PT Assessment Patient needs continued PT services  PT Problem List Decreased balance;Decreased mobility;Decreased safety awareness       PT Treatment Interventions DME instruction;Gait training;Stair training;Functional mobility training;Therapeutic activities;Therapeutic exercise;Balance training;Cognitive remediation;Patient/family education    PT Goals (Current goals can be found in the Care Plan section)  Acute Rehab PT Goals Patient Stated Goal: Return Home PT Goal Formulation: With patient Time For Goal Achievement: 08/13/24 Potential to Achieve Goals: Good    Frequency Min 2X/week     Co-evaluation               AM-PAC PT 6 Clicks Mobility  Outcome Measure Help needed turning from your back to your side while in a flat bed without using bedrails?: A Little Help needed moving from lying on your back to sitting on the side of a flat bed without using bedrails?: A Little Help needed moving to and from a bed to a chair (including  a wheelchair)?: A Little Help needed standing up from a chair using your arms (e.g., wheelchair or bedside chair)?: A Little Help needed to walk in hospital room?: A Little Help needed climbing 3-5 steps with a railing? : A Lot 6 Click Score: 17    End of Session Equipment Utilized During Treatment: Gait belt Activity Tolerance: Patient tolerated treatment well Patient left: in chair;with call bell/phone within reach;with chair  alarm set Nurse Communication: Mobility status PT Visit Diagnosis: Difficulty in walking, not elsewhere classified (R26.2);Other abnormalities of gait and mobility (R26.89);Unsteadiness on feet (R26.81)    Time: 9055-8992 PT Time Calculation (min) (ACUTE ONLY): 23 min   Charges:   PT Evaluation $PT Eval Moderate Complexity: 1 Mod   PT General Charges $$ ACUTE PT VISIT: 1 Visit         Randall SAUNDERS, PT, DPT Acute Rehabilitation Services Office: [phone] Secure Chat Preferred  Howard Johnson 07/30/2024, 12:40 PM

## 2024-07-30 NOTE — Procedures (Signed)
 Patient Name: Howard Johnson  MRN: [id number]  Epilepsy Attending: Arlin MALVA Krebs  Referring Physician/Provider: Michaela Aisha SQUIBB, MD  Duration: 07/29/2024 1805 to 07/30/2024 1805   Patient history: 67 yo man with hx DVST and SAH in Feb 2025, DM2, HL, HTN who presents with acute onset severe global aphasia. EEG to evaluate for seizure   Level of alertness: Awake, asleep   AEDs during EEG study: LEV, LCM, VPA   Technical aspects: This EEG study was done with scalp electrodes positioned according to the 10-20 International system of electrode placement. Electrical activity was reviewed with band pass filter of 1-70Hz , sensitivity of 7 uV/mm, display speed of 78mm/sec with a 60Hz  notched filter applied as appropriate. EEG data were recorded continuously and digitally stored.  Video monitoring was available and reviewed as appropriate.   Description: The posterior dominant rhythm consists of 8-9 Hz activity of moderate voltage (25-35 uV) seen predominantly in posterior head regions, asymmetric ( left<right) and reactive to eye opening and eye closing.  Sleep was characterized by vertex waves, sleep spindles (12 to 14 Hz), maximal frontocentral region.  EEG showed continuous 3-5 hz theta- delta slowing in left hemisphere, maximal left temporal region.  Lateralized periodic discharges were noted in left hemisphere, maximal left temporal region at 0.5 to 1 Hz, with overlying rhythmicity and fast activity.  Hyperventilation and photic stimulation were not performed.      Seizures without clinical signs were noted during the study arising from the left hemisphere, maximal left temporal region.  During the seizure EEG showed lateralized periodic discharges with overriding fast activity in left hemisphere, maximal left temporal region which evolved in frequency to 2 to 2.5 Hz, and appeared more sharply contoured with overlying rhythmicity.  EEG then evolved into 3 to 5 Hz theta-delta slowing admixed with  13-15hz  beta activity. Seizures were noted on 07/29/2024 at 2040, 2113, 2362, 2236, 2342 and on 07/30/2024 at 0536 . Duration of seizures was about  1-1.5 minute.    ABNORMALITY - Lateralized periodic discharge with overriding fast activity and rhythmicity ( LPD+F+R ) Left hemisphere, maximal left temporal region - Seizures without clinical signs, left temporal region - Continuous slow, left hemisphere, maximal left temporal region   IMPRESSION: Thi study showed seizures without clinical signs arising from left temporal region. Seizures were noted on on 07/29/2024 at 2040, 2113, 2362, 2236, 2342 and on 07/30/2024 at 0536,  lasting for about about 1-1.5 minute. EEG also showed evidence of epileptogenicity arising from left hemisphere, maximal left temporal region.  This EEG pattern is on the ictal This EEG pattern is on the ictal-interictal continuum with increased risk of seizure recurrence.  Lastly there is cortical dysfunction in left hemisphere, maximal left temporal region likely secondary to underlying structural abnormality, seizures.    Fayelynn Distel O Verta Riedlinger

## 2024-07-30 NOTE — Progress Notes (Signed)
 Subjective: No acute events overnight.  Was able to tell me his name today but continues to be frustrated because of aphasia.  Did eat his breakfast and per RN has been taking his medications.   ROS: Unable to obtain due to aphasia  Examination  Vital signs in last 24 hours: Temp:  [98 F (36.7 C)-98.4 F (36.9 C)] 98.4 F (36.9 C) (12/18 0749) Pulse Rate:  [70-77] 77 (12/18 0749) Resp:  [16-19] 16 (12/18 0749) BP: (92-132)/(52-86) 118/77 (12/18 0749) SpO2:  [96 %-99 %] 96 % (12/18 0749)  General: lying in bed, NAD Neuro: awake, alert, did tell me his name and keep repeating that he is feeling fine, aphasic so unable to follow commands and doesn't answer question, can mimic, PERLA, EOMI,unable to assess visual fields, moving all extremities   Basic Metabolic Panel: Recent Labs  Lab 07/27/24 1030 07/27/24 1039  NA 140 141  K 4.1 4.0  CL 104 104  CO2 21*  --   GLUCOSE 133* 128*  BUN 28* 31*  CREATININE 1.66* 1.80*  CALCIUM  9.2  --     CBC: Recent Labs  Lab 07/27/24 1030 07/27/24 1039  WBC 10.0  --   NEUTROABS 8.4*  --   HGB 16.7 17.3*  HCT 52.1* 51.0  MCV 87.0  --   PLT 379  --      Coagulation Studies: No results for input(s): LABPROT, INR in the last 72 hours.  No new brain imaging overnight   ASSESSMENT AND PLAN:67 y.o. 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    Status epilepticus, resolved Seizures due to underlying stroke - Start clonazepam  0.5 mg 3 times daily - Continue current dose of Keppra , Vimpat  and Depakote - Continue LTM EEG while we adjust anti-seizure medication -continue seizure precautions - Rest per primary team      I personally spent a total of 35 minutes in the care of the patient today including getting/reviewing separately obtained history, performing a medically appropriate exam/evaluation, counseling and educating, placing orders, referring and  communicating with other health care professionals, documenting clinical information in the EHR, independently interpreting results, and coordinating care.        Arlin Krebs Epilepsy [facility] For questions after 5pm please refer to AMION to reach the Neurologist on call

## 2024-07-30 NOTE — Progress Notes (Signed)
 PROGRESS NOTE    Howard Johnson  FMW:[id number] DOB: Mar 28, 1956 DOA: 07/27/2024 PCP: Shona Norleen PEDLAR, MD   Brief Narrative: Howard Johnson is a 68 y.o. male with a history of left temporal lobe intracranial bleeding, cerebral venous thrombosis, diabetes mellitus type 2, hypertension, CKD stage IIIa, hyperlipidemia.  Patient presented secondary to aphasia with concern for stroke.  Stroke workup was negative however seizure workup revealed evidence of status epilepticus.  Neurology started patient on antiepileptic drug therapy for management and are currently titrating medications.   Assessment and Plan:  Status epilepticus Patient noted to have very frequent seizures while on LTM EEG during workup for aphasia.  Neurology have started him on Keppra , Vimpat  and valproate for management.  Patient still with seizures overnight. -Neurology recommendations: Continue Vimpat , valproate and Keppra ; start clonazepam  scheduled  Aphasia [place] Secondary to uncontrolled seizures.  History of left temporal intracranial bleeding History of cerebral venous thrombosis Noted. Prior to arrival medication(s) include Eliquis . -Continue Eliquis   Diabetes mellitus type 2 Well controlled based on hemoglobin A1C of 5.7%. prior to arrival medication(s) include semaglutide although is possibly transitioned to Lantus . Unlikely that patient needs insulin  therapy for his current A1C level. -Continue SSI  CKD stage IIIa Baseline creatinine appears to be between 1.5 to 1.7. Creatinine of 1.66 on admission and slightly up to 1.8 and but is stable.  Primary hypertension Prior to arrival medication(s) include lisinopril . -Continue lisinopril   Hyperlipidemia Prior to arrival medication(s) include Crestor . -Continue Crestor     DVT prophylaxis: Eliquis  Code Status:   Code Status: Limited: Do not attempt resuscitation (DNR) -DNR-LIMITED -Do Not Intubate/DNI  Family Communication: Wife and son at  bedside Disposition Plan: Discharge home with outpatient PT pending ongoing neurology recommendations/management   Consultants:  Neurology  Procedures:  LTM EEG  Antimicrobials: None    Subjective: Difficult to ascertain secondary to aphasia.  Per family, patient seems to be improving as it relates to his speech.  Objective: BP (!) 141/78 (BP Location: Left Arm)   Pulse 84   Temp 98.3 F (36.8 C) (Oral)   Resp 18   Ht 6' 4 (1.93 m)   Wt 97.5 kg   SpO2 99%   BMI 26.16 kg/m   Examination:  General exam: Appears calm and comfortable. Respiratory system: Clear to auscultation. Respiratory effort normal. Cardiovascular system: S1 & S2 heard, RRR. Gastrointestinal system: Abdomen is nondistended, soft and nontender. Normal bowel sounds heard. Central nervous system: Alert.  Aphasia. Musculoskeletal: No edema. No calf tenderness   Data Reviewed: I have personally reviewed following labs and imaging studies  CBC Lab Results  Component Value Date   WBC 10.0 07/27/2024   RBC 5.99 (H) 07/27/2024   HGB 17.3 (H) 07/27/2024   HCT 51.0 07/27/2024   MCV 87.0 07/27/2024   MCH 27.9 07/27/2024   PLT 379 07/27/2024   MCHC 32.1 07/27/2024   RDW 13.8 07/27/2024   LYMPHSABS 0.7 07/27/2024   MONOABS 0.6 07/27/2024   EOSABS 0.1 07/27/2024   BASOSABS 0.1 07/27/2024     Last metabolic panel Lab Results  Component Value Date   NA 139 07/30/2024   K 3.8 07/30/2024   CL 105 07/30/2024   CO2 23 07/30/2024   BUN 33 (H) 07/30/2024   CREATININE 1.79 (H) 07/30/2024   GLUCOSE 133 (H) 07/30/2024   GFRNONAA 40 (L) 07/30/2024   CALCIUM  9.7 07/30/2024   PHOS 3.3 09/27/2023   PROT 7.1 07/27/2024   ALBUMIN 4.8 07/27/2024   LABGLOB 2.2 11/23/2020  AGRATIO 2.2 11/23/2020   BILITOT 0.5 07/27/2024   ALKPHOS 96 07/27/2024   AST 24 07/27/2024   ALT 21 07/27/2024   ANIONGAP 11 07/30/2024    GFR: Estimated Creatinine Clearance: 47.1 mL/min (A) (by C-G formula based on SCr of 1.79  mg/dL (H)).  No results found for this or any previous visit (from the past 240 hours).    Radiology Studies: No results found.     LOS: 3 days    Elgin Lam, MD [facility] 07/30/2024, 1:16 PM   If 7PM-7AM, please contact night-coverage [email or web address]

## 2024-07-30 NOTE — Evaluation (Signed)
 Occupational Therapy Evaluation/Discharge Patient Details Name: Howard Johnson MRN: [id number] DOB: 1955/09/24 Today's Date: 07/30/2024   History of Present Illness   68 y.o. male presents to [facility] on 07/29/2024 with acute onset severe global aphasia. Imaging negative for acute findings, expected evolution left temporal lobe hematoma since February. Multiple seizures noted on LTM EEG. PMHx: L temporal ICH, cerebral venous thrombosis, T2DM, HTN, CKD III, and HLD.     Clinical Impressions Patient evaluated by Occupational Therapy with no further acute OT needs identified. Information taken from chart review as pt is experiencing severe aphasia. Predicting that pt is very close to his baseline physically and recommend following up with outpatient OT to assess any needs at discharge.      If plan is discharge home, recommend the following:   Direct supervision/assist for medications management;Direct supervision/assist for financial management;Assist for transportation;Supervision due to cognitive status     Functional Status Assessment   Patient has had a recent decline in their functional status and demonstrates the ability to make significant improvements in function in a reasonable and predictable amount of time.     Equipment Recommendations   None recommended by OT      Precautions/Restrictions   Precautions Precautions: Fall Recall of Precautions/Restrictions: Intact Restrictions Weight Bearing Restrictions Per Provider Order: No     Mobility Bed Mobility Overal bed mobility: Needs Assistance Bed Mobility: Supine to Sit     Supine to sit: Supervision, HOB elevated, Used rails     General bed mobility comments: Pt sat up on L side of bed with increased time. He brought BLE off EOB. Elevated trunk and scooted fwd with rail.    Transfers Overall transfer level: Needs assistance Equipment used: None Transfers: Sit to/from Stand, Bed to chair/wheelchair/BSC Sit  to Stand: Contact guard assist     Step pivot transfers: Contact guard assist, +2 safety/equipment (EEG and IV pole)     General transfer comment: Pt stood from lowest bed height and recliner chair. He pushed up with BUE support. Powered up without physical assist. Good eccentric control.      Balance Overall balance assessment: Needs assistance Sitting-balance support: No upper extremity supported, Feet supported Sitting balance-Leahy Scale: Fair Sitting balance - Comments: Pt sat EOB with close supervision   Standing balance support: During functional activity, No upper extremity supported Standing balance-Leahy Scale: Fair Standing balance comment: Pt ambulated with CGA.        ADL either performed or assessed with clinical judgement   ADL       General ADL Comments: Unable to fully assess ADL performance d/t communication barriers. Predict that pt would require SBA for ADL completion right now.     Vision Baseline Vision/History: 1 Wears glasses Ability to See in Adequate Light:  (unknown) Patient Visual Report:  (unable to report) Vision Assessment?:  (unable to assess)     Perception Perception: Not tested       Praxis Praxis: Not tested       Pertinent Vitals/Pain Pain Assessment Pain Assessment: Faces Faces Pain Scale: No hurt     Extremity/Trunk Assessment Upper Extremity Assessment Upper Extremity Assessment: Right hand dominant;Difficult to assess due to impaired cognition (unable to follow commands to assess strength)   Lower Extremity Assessment Lower Extremity Assessment: Defer to PT evaluation   Cervical / Trunk Assessment Cervical / Trunk Assessment: Normal   Communication Communication Communication: Impaired Factors Affecting Communication: Difficulty expressing self;Reduced clarity of speech (severe expressive/receptive aphasia)   Cognition Arousal: Alert Behavior  During Therapy: WFL for tasks assessed/performed Cognition: Difficult to  assess Difficult to assess due to: Impaired communication    OT - Cognition Comments: Pt with expressive and receptive aphasia    Following commands: Impaired Following commands impaired: Follows one step commands with increased time (verbal and gestural cueing required.)     Cueing  General Comments   Cueing Techniques: Verbal cues;Gestural cues  VSS on RA           Home Living Family/patient expects to be discharged to:: Private residence Living Arrangements: Spouse/significant other;Children Available Help at Discharge: Family Type of Home: House Home Access: Stairs to enter Secretary/administrator of Steps: 2   Home Layout: One level     Bathroom Shower/Tub: Producer, Television/film/video: Standard Bathroom Accessibility: Yes       Additional Comments: Home set-up obtain per chart review from prior admission in Feb 2025.      Prior Functioning/Environment Prior Level of Function : Patient poor historian/Family not available;Independent/Modified Independent    Mobility Comments: Per Chart Review, OPPT Eval on 11/07/23 pt was walking 2 miles every day with his son, mowing the yard, ambulating, and navigating stairs. He denied fall hx. Scored a 23/24 on the DGI. ADLs Comments: Per Char Review, OPOT Eval on 10/16/23 pt was independent with all ADLs and majority of IADLs despite driving, cooking, and medication management.    OT Problem List: Decreased strength        OT Goals(Current goals can be found in the care plan section)   Acute Rehab OT Goals Patient Stated Goal: none stated OT Goal Formulation: All assessment and education complete, DC therapy   OT Frequency:   1X visit    Co-evaluation PT/OT/SLP Co-Evaluation/Treatment: Yes Reason for Co-Treatment: To address functional/ADL transfers   OT goals addressed during session: ADL's and self-care;Strengthening/ROM      AM-PAC OT 6 Clicks Daily Activity     Outcome Measure Help from another  person eating meals?: None Help from another person taking care of personal grooming?: A Little Help from another person toileting, which includes using toliet, bedpan, or urinal?: A Little Help from another person bathing (including washing, rinsing, drying)?: A Little Help from another person to put on and taking off regular upper body clothing?: A Little Help from another person to put on and taking off regular lower body clothing?: A Little 6 Click Score: 19   End of Session Equipment Utilized During Treatment: Gait belt  Activity Tolerance: Patient tolerated treatment well Patient left: in chair;with call bell/phone within reach;with chair alarm set  OT Visit Diagnosis: Muscle weakness (generalized) (M62.81)                Time: 9054-8992 OT Time Calculation (min): 22 min Charges:  OT General Charges $OT Visit: 1 Visit OT Evaluation $OT Eval Low Complexity: 1 Low  [employer], OTR/L,CBIS  Supplemental OT - [facility] and [facility] Secure Chat Preferred    Leotha Westermeyer, Leita BIRCH 07/30/2024, 3:03 PM

## 2024-07-30 NOTE — Care Management Important Message (Signed)
 Important Message  Patient Details  Name: Howard Johnson MRN: [id number] Date of Birth: 1956/02/28   Important Message Given:  Yes - Medicare IM     Claretta Deed 07/30/2024, 3:03 PM

## 2024-07-31 ENCOUNTER — Inpatient Hospital Stay (HOSPITAL_COMMUNITY)

## 2024-07-31 DIAGNOSIS — R4701 Aphasia: Secondary | ICD-10-CM | POA: Diagnosis not present

## 2024-07-31 DIAGNOSIS — I69398 Other sequelae of cerebral infarction: Secondary | ICD-10-CM | POA: Diagnosis not present

## 2024-07-31 DIAGNOSIS — G40909 Epilepsy, unspecified, not intractable, without status epilepticus: Secondary | ICD-10-CM | POA: Diagnosis not present

## 2024-07-31 DIAGNOSIS — R569 Unspecified convulsions: Secondary | ICD-10-CM

## 2024-07-31 LAB — GLUCOSE, CAPILLARY
Glucose-Capillary: 100 mg/dL — ABNORMAL HIGH (ref 70–99)
Glucose-Capillary: 102 mg/dL — ABNORMAL HIGH (ref 70–99)
Glucose-Capillary: 114 mg/dL — ABNORMAL HIGH (ref 70–99)
Glucose-Capillary: 100 mg/dL — ABNORMAL HIGH (ref 70–99)
Glucose-Capillary: 96 mg/dL (ref 70–99)

## 2024-07-31 MED ORDER — CLONAZEPAM 0.5 MG PO TABS
0.5000 mg | ORAL_TABLET | Freq: Two times a day (BID) | ORAL | Status: DC
  Administered 2024-07-31 – 2024-08-05 (×10): 0.5 mg via ORAL
  Filled 2024-07-31 (×11): qty 1

## 2024-07-31 MED ORDER — CLONAZEPAM 0.5 MG PO TABS
0.5000 mg | ORAL_TABLET | Freq: Once | ORAL | Status: AC
  Administered 2024-07-31: 0.5 mg via ORAL
  Filled 2024-07-31: qty 1

## 2024-07-31 MED ORDER — CLONAZEPAM 1 MG PO TABS
1.0000 mg | ORAL_TABLET | Freq: Every day | ORAL | Status: DC
  Administered 2024-07-31 – 2024-08-04 (×5): 1 mg via ORAL
  Filled 2024-07-31 (×5): qty 1

## 2024-07-31 NOTE — Progress Notes (Signed)
 "  PROGRESS NOTE    Greogory Cornette  FMW:[id number] DOB: 26-Jul-1956 DOA: 07/27/2024 PCP: Shona Norleen PEDLAR, MD   Brief Narrative: Howard Johnson is a 68 y.o. male with a history of left temporal lobe intracranial bleeding, cerebral venous thrombosis, diabetes mellitus type 2, hypertension, CKD stage IIIa, hyperlipidemia.  Patient presented secondary to aphasia with concern for stroke.  Stroke workup was negative however seizure workup revealed evidence of status epilepticus.  Neurology started patient on antiepileptic drug therapy for management and are currently titrating medications.   Assessment and Plan:  Status epilepticus Patient noted to have very frequent seizures while on LTM EEG during workup for aphasia.  Neurology have started him on Keppra , Vimpat  and valproate for management.  Patient still with seizures overnight. -Neurology recommendations: Continue Vimpat , valproate and Keppra ; increase to clonazepam  1mg  scheduled  Aphasia [place] Secondary to uncontrolled seizures.  History of left temporal intracranial bleeding History of cerebral venous thrombosis Noted. Prior to arrival medication(s) include Eliquis . -Continue Eliquis   Diabetes mellitus type 2 Well controlled based on hemoglobin A1C of 5.7%. prior to arrival medication(s) include semaglutide although is possibly transitioned to Lantus . Unlikely that patient needs insulin  therapy for his current A1C level. -Continue SSI  CKD stage IIIa Baseline creatinine appears to be between 1.5 to 1.7. Creatinine of 1.66 on admission and slightly up to 1.8 and but is stable.  Primary hypertension Prior to arrival medication(s) include lisinopril . -Continue lisinopril   Hyperlipidemia Prior to arrival medication(s) include Crestor . -Continue Crestor     DVT prophylaxis: Eliquis  Code Status:   Code Status: Limited: Do not attempt resuscitation (DNR) -DNR-LIMITED -Do Not Intubate/DNI  Family Communication: None at  bedside Disposition Plan: Discharge home with outpatient PT pending ongoing neurology recommendations/management   Consultants:  Neurology  Procedures:  LTM EEG  Antimicrobials: None    Subjective: No issues overnight.  Objective: BP 110/68 (BP Location: Left Arm)   Pulse 71   Temp 98.7 F (37.1 C) (Oral)   Resp 17   Ht 6' 4 (1.93 m)   Wt 97.5 kg   SpO2 97%   BMI 26.16 kg/m   Examination:  General exam: Appears calm and comfortable. Respiratory system: Respiratory effort normal.   Data Reviewed: I have personally reviewed following labs and imaging studies  CBC Lab Results  Component Value Date   WBC 10.0 07/27/2024   RBC 5.99 (H) 07/27/2024   HGB 17.3 (H) 07/27/2024   HCT 51.0 07/27/2024   MCV 87.0 07/27/2024   MCH 27.9 07/27/2024   PLT 379 07/27/2024   MCHC 32.1 07/27/2024   RDW 13.8 07/27/2024   LYMPHSABS 0.7 07/27/2024   MONOABS 0.6 07/27/2024   EOSABS 0.1 07/27/2024   BASOSABS 0.1 07/27/2024     Last metabolic panel Lab Results  Component Value Date   NA 139 07/30/2024   K 3.8 07/30/2024   CL 105 07/30/2024   CO2 23 07/30/2024   BUN 33 (H) 07/30/2024   CREATININE 1.79 (H) 07/30/2024   GLUCOSE 133 (H) 07/30/2024   GFRNONAA 40 (L) 07/30/2024   CALCIUM  9.7 07/30/2024   PHOS 3.3 09/27/2023   PROT 7.1 07/27/2024   ALBUMIN 4.8 07/27/2024   LABGLOB 2.2 11/23/2020   AGRATIO 2.2 11/23/2020   BILITOT 0.5 07/27/2024   ALKPHOS 96 07/27/2024   AST 24 07/27/2024   ALT 21 07/27/2024   ANIONGAP 11 07/30/2024    GFR: Estimated Creatinine Clearance: 47.1 mL/min (A) (by C-G formula based on SCr of 1.79 mg/dL (H)).  No results  found for this or any previous visit (from the past 240 hours).    Radiology Studies: No results found.     LOS: 4 days    Elgin Lam, MD [facility] 07/31/2024, 1:11 PM   If 7PM-7AM, please contact night-coverage [email or web address]  "

## 2024-07-31 NOTE — Progress Notes (Signed)
 Subjective: No acute events overnight.  Was able to communicate a little bit better today.  However continues to have significant aphasia.  ROS: Unable to obtain due to aphasia  Examination  Vital signs in last 24 hours: Temp:  [97.5 F (36.4 C)-98.3 F (36.8 C)] 98 F (36.7 C) (12/19 0356) Pulse Rate:  [63-84] 63 (12/19 0356) Resp:  [16-18] 16 (12/19 0015) BP: (100-141)/(60-80) 100/60 (12/19 0356) SpO2:  [96 %-99 %] 96 % (12/19 0356)  General: lying in bed, NAD Neuro: awake, alert, did tell me his name, reports he is fine, could not tell me what he had for breakfast but did say it was not good, also repeated a few words from my questions, can mimic, PERLA, EOMI,unable to assess visual fields, moving all extremities   Basic Metabolic Panel: Recent Labs  Lab 07/27/24 1030 07/27/24 1039 07/30/24 1134  NA 140 141 139  K 4.1 4.0 3.8  CL 104 104 105  CO2 21*  --  23  GLUCOSE 133* 128* 133*  BUN 28* 31* 33*  CREATININE 1.66* 1.80* 1.79*  CALCIUM  9.2  --  9.7    CBC: Recent Labs  Lab 07/27/24 1030 07/27/24 1039  WBC 10.0  --   NEUTROABS 8.4*  --   HGB 16.7 17.3*  HCT 52.1* 51.0  MCV 87.0  --   PLT 379  --      Coagulation Studies: No results for input(s): LABPROT, INR in the last 72 hours.  Imaging No new brain imaging overnight     ASSESSMENT AND PLAN:67 y.o. 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    Status epilepticus, resolved Seizures due to underlying stroke - Did not have any more definite seizures overnight.  However continues to have epileptiform discharges. His aphasia appears to be slightly improving.  I will give him an additional dose of 0.5 mg clonazepam  this morning as well as 1 mg clonazepam  tonight to see if this will help with improvement in lateralized periodic discharges without causing excessive sedation - Continue current dose of Keppra , Vimpat  and  Depakote - Continue LTM EEG while we adjust anti-seizure medication -continue seizure precautions -Discussed plan with hospitalist via secure chat - Rest per primary team       I personally spent a total of 36 minutes in the care of the patient today including getting/reviewing separately obtained history, performing a medically appropriate exam/evaluation, counseling and educating, placing orders, referring and communicating with other health care professionals, documenting clinical information in the EHR, independently interpreting results, and coordinating care.         Arlin Krebs Epilepsy [facility] For questions after 5pm please refer to AMION to reach the Neurologist on call

## 2024-07-31 NOTE — Progress Notes (Signed)
 LTM maint complete - no skin breakdown under: F8, P8, and FP1. All impedances under 10

## 2024-07-31 NOTE — Procedures (Addendum)
 Patient Name: Howard Johnson  MRN: [id number]  Epilepsy Attending: Arlin MALVA Krebs  Referring Physician/Provider: Michaela Aisha SQUIBB, MD  Duration: 07/30/2024 1805 to 07/31/2024 1115   Patient history: 68 yo man with hx DVST and SAH in Feb 2025, DM2, HL, HTN who presents with acute onset severe global aphasia. EEG to evaluate for seizure   Level of alertness: Awake, asleep   AEDs during EEG study: LEV, LCM, VPA, Clonazepam    Technical aspects: This EEG study was done with scalp electrodes positioned according to the 10-20 International system of electrode placement. Electrical activity was reviewed with band pass filter of 1-70Hz , sensitivity of 7 uV/mm, display speed of 56mm/sec with a 60Hz  notched filter applied as appropriate. EEG data were recorded continuously and digitally stored.  Video monitoring was available and reviewed as appropriate.   Description: The posterior dominant rhythm consists of 8-9 Hz activity of moderate voltage (25-35 uV) seen predominantly in posterior head regions, asymmetric ( left<right) and reactive to eye opening and eye closing.  Sleep was characterized by vertex waves, sleep spindles (12 to 14 Hz), maximal frontocentral region.  EEG showed continuous 3-5 hz theta- delta slowing in left hemisphere, maximal left temporal region.  Lateralized periodic discharges were noted in left hemisphere, maximal left temporal region at 0.5 to 1 Hz, with overlying rhythmicity and fast activity.  Hyperventilation and photic stimulation were not performed.     ABNORMALITY - Lateralized periodic discharge with overriding fast activity and rhythmicity ( LPD+F+R ) Left hemisphere, maximal left temporal region - Continuous slow, left hemisphere, maximal left temporal region   IMPRESSION: Thi study showed evidence of epileptogenicity arising from left hemisphere, maximal left temporal region.  This EEG pattern is on the ictal-interictal continuum with increased risk of seizure  recurrence. Additionally there is cortical dysfunction in left hemisphere, maximal left temporal region likely secondary to underlying structural abnormality, seizures.    Tamina Cyphers O Kratos Ruscitti

## 2024-07-31 NOTE — TOC Progression Note (Signed)
 Transition of Care [facility]) - Progression Note    Patient Details  Name: Howard Johnson MRN: [id number] Date of Birth: 09/06/1955  Transition of Care [facility]) CM/SW Contact  Andrez JULIANNA George, RN Phone Number: 07/31/2024, 9:46 AM  Clinical Narrative:     Pt continues with aphasia and on LTEEG. IP Care management following.  Expected Discharge Plan: Home/Self Care Barriers to Discharge: Continued Medical Work up               Expected Discharge Plan and Services       Living arrangements for the past 2 months: Single Family Home                                       Social Drivers of Health (SDOH) Interventions SDOH Screenings   Food Insecurity: Patient Unable To Answer (07/29/2024)  Housing: Patient Unable To Answer (07/29/2024)  Transportation Needs: Patient Unable To Answer (07/29/2024)  Utilities: Patient Unable To Answer (07/29/2024)  Depression (PHQ2-9): Low Risk (10/16/2023)  Social Connections: Unknown (07/29/2024)  Tobacco Use: Low Risk (07/27/2024)    Readmission Risk Interventions     No data to display

## 2024-07-31 NOTE — Progress Notes (Signed)
 Physical Therapy Treatment Patient Details Name: Howard Johnson MRN: [id number] DOB: 06-08-1956 Today's Date: 07/31/2024   History of Present Illness 68 y.o. male presents to [facility] on 07/29/2024 with acute onset severe global aphasia. Imaging negative for acute findings, expected evolution left temporal lobe hematoma since February. Multiple seizures noted on LTM EEG. PMHx: L temporal ICH, cerebral venous thrombosis, T2DM, HTN, CKD III, and HLD.    PT Comments  Pt greeted supine in bed, pleasant and agreeable to PT session. He had more verbalizations this date, but perseverated. Pt required increased time and max multi-modal cues to participate in functional mobility. PT  facilitated movement in order to get pt to initiate. He benefited most from tactile stimulation. Pt engaged in short-distance ambulation within the room. Gait distance limited by LTM EEG. He was unsteady this date intermittently attempting to furniture walk and experiencing 2 LOB requiring minA to stabilize. Attempted to have pt complete standing exercises including lunges and squats with minimal success. He had significant difficulty following commands and required frequent redirection to focus on task. Will continue to follow acutely and advance appropriately.      If plan is discharge home, recommend the following: A little help with walking and/or transfers;A little help with bathing/dressing/bathroom;Assistance with cooking/housework;Assist for transportation;Help with stairs or ramp for entrance;Supervision due to cognitive status;Direct supervision/assist for medications management   Can travel by private vehicle        Equipment Recommendations  None recommended by PT    Recommendations for Other Services       Precautions / Restrictions Precautions Precautions: Fall Recall of Precautions/Restrictions: Impaired Restrictions Weight Bearing Restrictions Per Provider Order: No     Mobility  Bed Mobility Overal bed  mobility: Needs Assistance Bed Mobility: Supine to Sit, Sit to Supine     Supine to sit: Contact guard, HOB elevated Sit to supine: Contact guard assist   General bed mobility comments: Instructed pt to come to the EOB. He required tactile facilitation of PT lifting each leg in order to initate moving and bring each leg towards the EOB. Pt pulled on PT to elevate trunk and scoot hips fwd. Returning to bed pt brought BLE with light assist.    Transfers Overall transfer level: Needs assistance Equipment used: None Transfers: Sit to/from Stand Sit to Stand: Contact guard assist           General transfer comment: Pt stood, pushed up with BUE support, with CGA for safety/support. Attempted to complete the 5xSTS and/or 30 second chair stand test, but pt unable to follow commands despite max multi-modal cues. He required PT to facilitate posterior movement in order to guide him to sit down.    Ambulation/Gait Ambulation/Gait assistance: Contact guard assist, +2 safety/equipment, Min assist (EEG and IV pole) Gait Distance (Feet): 10 Feet (x6) Assistive device: None Gait Pattern/deviations: Step-through pattern, Decreased stride length Gait velocity: reduced     General Gait Details: Pt utilized a step-through gait pattern. He intermittently reached out with BUE support to wall, computer, sink, IV pole, etc. while mobilizing this date. Pt unsteady with increased postural sway noted and 2 LOB requiring minA to stabilize.   Stairs             Wheelchair Mobility     Tilt Bed    Modified Rankin (Stroke Patients Only)       Balance Overall balance assessment: Needs assistance Sitting-balance support: No upper extremity supported, Feet supported Sitting balance-Leahy Scale: Fair     Standing  balance support: During functional activity, No upper extremity supported Standing balance-Leahy Scale: Fair Standing balance comment: Pt required CGA during gait and had 2 LOB  requiring minA to stabilize.                            Communication Communication Communication: Impaired Factors Affecting Communication: Difficulty expressing self;Reduced clarity of speech (severe expressive/receptive aphasia)  Cognition Arousal: Alert Behavior During Therapy: WFL for tasks assessed/performed   PT - Cognitive impairments: Difficult to assess, No family/caregiver present to determine baseline, Initiation, Sequencing, Problem solving, Safety/Judgement Difficult to assess due to: Impaired communication                     PT - Cognition Comments: Pt perseverating on already done this and no more heavy lifting. He took increased time to follow commands requiring tactile facilitation to complete the task. Following commands: Impaired Following commands impaired: Follows one step commands with increased time    Cueing Cueing Techniques: Verbal cues, Gestural cues, Visual cues, Tactile cues  Exercises General Exercises - Lower Extremity Long Arc Quad: Seated, Both, AROM, 10 reps (Pt required facilitation to participate in motion.) Straight Leg Raises: Supine, Both, AAROM, 10 reps (Pt required facilitation to participate in motion.) Other Exercises Other Exercises: Attempted to have pt lunge. Positioned pt along the side of the bed with LUE support of FOB rail. He kept RLE infront and placed LLE slightly behind him. Pt achieved bilateral knee flex and came down in a semi-lunge position. x3 reps. Pt unsteady upon returning to stand requiring minA to stabilize. Deferred further attempts and switching leg position. Other Exercises: Squat: pt performed mini-squats towards bed x3 reps. Max multi-modal cues required to get pt to complete the movement.    General Comments        Pertinent Vitals/Pain Pain Assessment Pain Assessment: Faces Faces Pain Scale: No hurt    Home Living                          Prior Function            PT  Goals (current goals can now be found in the care plan section) Acute Rehab PT Goals Patient Stated Goal: Return Home PT Goal Formulation: With patient Time For Goal Achievement: 08/13/24 Potential to Achieve Goals: Good Progress towards PT goals: Progressing toward goals    Frequency    Min 2X/week      PT Plan      Co-evaluation              AM-PAC PT 6 Clicks Mobility   Outcome Measure  Help needed turning from your back to your side while in a flat bed without using bedrails?: A Little Help needed moving from lying on your back to sitting on the side of a flat bed without using bedrails?: A Little Help needed moving to and from a bed to a chair (including a wheelchair)?: A Little Help needed standing up from a chair using your arms (e.g., wheelchair or bedside chair)?: A Little Help needed to walk in hospital room?: A Little Help needed climbing 3-5 steps with a railing? : A Lot 6 Click Score: 17    End of Session Equipment Utilized During Treatment: Gait belt Activity Tolerance: Patient tolerated treatment well Patient left: in bed;with call bell/phone within reach;with bed alarm set Nurse Communication: Mobility status PT Visit Diagnosis:  Difficulty in walking, not elsewhere classified (R26.2);Other abnormalities of gait and mobility (R26.89);Unsteadiness on feet (R26.81)     Time: 8376-8344 PT Time Calculation (min) (ACUTE ONLY): 32 min  Charges:    $Gait Training: 8-22 mins $Therapeutic Exercise: 8-22 mins PT General Charges $$ ACUTE PT VISIT: 1 Visit                     Randall SAUNDERS, PT, DPT Acute Rehabilitation Services Office: [phone] Secure Chat Preferred  Howard Johnson 07/31/2024, 5:22 PM

## 2024-07-31 NOTE — Progress Notes (Signed)
 Went to pt room after call from [facility] stating that pt was pulling his wires off. Went in and re-applied leads and applied head wrap x2.

## 2024-08-01 ENCOUNTER — Inpatient Hospital Stay (HOSPITAL_COMMUNITY)

## 2024-08-01 DIAGNOSIS — I10 Essential (primary) hypertension: Secondary | ICD-10-CM

## 2024-08-01 DIAGNOSIS — I69398 Other sequelae of cerebral infarction: Secondary | ICD-10-CM | POA: Diagnosis not present

## 2024-08-01 DIAGNOSIS — N1831 Chronic kidney disease, stage 3a: Secondary | ICD-10-CM | POA: Diagnosis not present

## 2024-08-01 DIAGNOSIS — R4701 Aphasia: Secondary | ICD-10-CM | POA: Diagnosis not present

## 2024-08-01 DIAGNOSIS — E1122 Type 2 diabetes mellitus with diabetic chronic kidney disease: Secondary | ICD-10-CM

## 2024-08-01 DIAGNOSIS — R569 Unspecified convulsions: Secondary | ICD-10-CM | POA: Diagnosis not present

## 2024-08-01 DIAGNOSIS — G40909 Epilepsy, unspecified, not intractable, without status epilepticus: Secondary | ICD-10-CM | POA: Diagnosis not present

## 2024-08-01 LAB — GLUCOSE, CAPILLARY
Glucose-Capillary: 103 mg/dL — ABNORMAL HIGH (ref 70–99)
Glucose-Capillary: 106 mg/dL — ABNORMAL HIGH (ref 70–99)
Glucose-Capillary: 112 mg/dL — ABNORMAL HIGH (ref 70–99)
Glucose-Capillary: 144 mg/dL — ABNORMAL HIGH (ref 70–99)
Glucose-Capillary: 94 mg/dL (ref 70–99)

## 2024-08-01 MED ORDER — ORAL CARE MOUTH RINSE: 15.0000 mL | OROMUCOSAL | Status: DC | PRN

## 2024-08-01 MED ORDER — PERAMPANEL 2 MG PO TABS
6.0000 mg | ORAL_TABLET | Freq: Once | ORAL | Status: AC
  Administered 2024-08-01: 6 mg via ORAL
  Filled 2024-08-01: qty 3

## 2024-08-01 MED ORDER — PERAMPANEL 2 MG PO TABS
2.0000 mg | ORAL_TABLET | Freq: Every day | ORAL | Status: DC
  Administered 2024-08-01 – 2024-08-04 (×4): 2 mg via ORAL
  Filled 2024-08-01 (×4): qty 1

## 2024-08-01 NOTE — Plan of Care (Signed)
" °  Problem: Education: Goal: Knowledge of General Education information will improve Description: Including pain rating scale, medication(s)/side effects and non-pharmacologic comfort measures Outcome: Progressing   Problem: Clinical Measurements: Goal: Will remain free from infection Outcome: Progressing Goal: Respiratory complications will improve Outcome: Progressing Goal: Cardiovascular complication will be avoided Outcome: Progressing   Problem: Skin Integrity: Goal: Risk for impaired skin integrity will decrease Outcome: Progressing   "

## 2024-08-01 NOTE — Progress Notes (Signed)
 Subjective: EEG is improving, but clinically unchanged.   ROS: Unable to obtain due to aphasia  Examination  Vital signs in last 24 hours: Temp:  [97.5 F (36.4 C)-98.8 F (37.1 C)] 98.5 F (36.9 C) (12/20 0728) Pulse Rate:  [58-80] 66 (12/20 0335) Resp:  [16-19] 19 (12/20 0335) BP: (94-128)/(56-75) 128/70 (12/20 1009) SpO2:  [93 %-97 %] 94 % (12/20 0728)  General: lying in bed, NAD Neuro: awake, alert, significant difficulty with expression and perseveration, can mimic, PERLA, EOMI,unable to assess visual fields, moving all extremities well, no drift in upper extremities  Basic Metabolic Panel: Recent Labs  Lab 07/27/24 1030 07/27/24 1039 07/30/24 1134  NA 140 141 139  K 4.1 4.0 3.8  CL 104 104 105  CO2 21*  --  23  GLUCOSE 133* 128* 133*  BUN 28* 31* 33*  CREATININE 1.66* 1.80* 1.79*  CALCIUM  9.2  --  9.7    CBC: Recent Labs  Lab 07/27/24 1030 07/27/24 1039  WBC 10.0  --   NEUTROABS 8.4*  --   HGB 16.7 17.3*  HCT 52.1* 51.0  MCV 87.0  --   PLT 379  --     Imaging No new brain imaging overnight     ASSESSMENT AND PLAN:67 y.o. 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   He continues to have periodic discharges on EEG, though lacking the concerning features for an ictal nature that was seen earlier in his hospitalization.  It still is technically on the ictal-interictal continuum, and his symptoms are still very persistent.  It is possible that what we are looking at is now the sequela of a seizure as opposed to ongoing seizure but I think it may be beneficial to him to change medications for multiple reasons, including that Depakote can sometimes reduce the efficacy of anticoagulants.   Status epilepticus, resolved Seizures due to underlying stroke - Did not have any more definite seizures overnight.  However continues to have epileptiform discharges. His aphasia appears to be  slightly improving.  I will give him an additional dose of 0.5 mg clonazepam  this morning as well as 1 mg clonazepam  tonight to see if this will help with improvement in lateralized periodic discharges without causing excessive sedation - Continue current dose of Keppra , Vimpat   - Discontinue Depakote -Perampanel  6 mg x 1 then 2 mg nightly - Continue LTM EEG while we adjust anti-seizure medication -continue seizure precautions   Aisha Seals, MD [facility]   If 7pm- 7am, please page neurology on call as listed in AMION.

## 2024-08-01 NOTE — Progress Notes (Signed)
 "  PROGRESS NOTE    Burnard Enis  FMW:[id number] DOB: 08/04/1956 DOA: 07/27/2024 PCP: Shona Norleen PEDLAR, MD   Brief Narrative: Howard Johnson is a 68 y.o. male with a history of left temporal lobe intracranial bleeding, cerebral venous thrombosis, diabetes mellitus type 2, hypertension, CKD stage IIIa, hyperlipidemia.  Patient presented secondary to aphasia with concern for stroke.  Stroke workup was negative however seizure workup revealed evidence of status epilepticus.  Neurology started patient on antiepileptic drug therapy for management and are currently titrating medications.   Assessment and Plan:  Status epilepticus Patient noted to have very frequent seizures while on LTM EEG during workup for aphasia.  Neurology have started him on Keppra , Vimpat  and valproate for management.  Patient still with seizures overnight. -Neurology recommendations (12/20): Continue Vimpat , clonazepam  and Keppra ; discontinue Depakote, continue LTM EEG  Aphasia [place] Secondary to uncontrolled seizures. Some improvement with ongoing treatment of seizures.  History of left temporal intracranial bleeding History of cerebral venous thrombosis Noted. Prior to arrival medication(s) include Eliquis . -Continue Eliquis   Diabetes mellitus type 2 Well controlled based on hemoglobin A1C of 5.7%. prior to arrival medication(s) include semaglutide although is possibly transitioned to Lantus . Unlikely that patient needs insulin  therapy for his current A1C level. -Continue SSI  CKD stage IIIa Baseline creatinine appears to be between 1.5 to 1.7. Creatinine of 1.66 on admission and slightly up to 1.8 and but is stable.  Primary hypertension Prior to arrival medication(s) include lisinopril . -Continue lisinopril   Hyperlipidemia Prior to arrival medication(s) include Crestor . -Continue Crestor     DVT prophylaxis: Eliquis  Code Status:   Code Status: Limited: Do not attempt resuscitation (DNR) -DNR-LIMITED -Do  Not Intubate/DNI  Family Communication: None at bedside Disposition Plan: Discharge home with outpatient PT pending ongoing neurology recommendations/management   Consultants:  Neurology  Procedures:  LTM EEG  Antimicrobials: None    Subjective: No concerns this morning.  Objective: BP 129/74 (BP Location: Right Arm)   Pulse 75   Temp 98.2 F (36.8 C) (Oral)   Resp 19   Ht 6' 4 (1.93 m)   Wt 97.5 kg   SpO2 95%   BMI 26.16 kg/m   Examination:  General exam: Appears calm and comfortable. Respiratory system: Respiratory effort normal. Neurological system: Aphasia   Data Reviewed: I have personally reviewed following labs and imaging studies  CBC Lab Results  Component Value Date   WBC 10.0 07/27/2024   RBC 5.99 (H) 07/27/2024   HGB 17.3 (H) 07/27/2024   HCT 51.0 07/27/2024   MCV 87.0 07/27/2024   MCH 27.9 07/27/2024   PLT 379 07/27/2024   MCHC 32.1 07/27/2024   RDW 13.8 07/27/2024   LYMPHSABS 0.7 07/27/2024   MONOABS 0.6 07/27/2024   EOSABS 0.1 07/27/2024   BASOSABS 0.1 07/27/2024     Last metabolic panel Lab Results  Component Value Date   NA 139 07/30/2024   K 3.8 07/30/2024   CL 105 07/30/2024   CO2 23 07/30/2024   BUN 33 (H) 07/30/2024   CREATININE 1.79 (H) 07/30/2024   GLUCOSE 133 (H) 07/30/2024   GFRNONAA 40 (L) 07/30/2024   CALCIUM  9.7 07/30/2024   PHOS 3.3 09/27/2023   PROT 7.1 07/27/2024   ALBUMIN 4.8 07/27/2024   LABGLOB 2.2 11/23/2020   AGRATIO 2.2 11/23/2020   BILITOT 0.5 07/27/2024   ALKPHOS 96 07/27/2024   AST 24 07/27/2024   ALT 21 07/27/2024   ANIONGAP 11 07/30/2024    GFR: Estimated Creatinine Clearance: 47.1 mL/min (A) (by  C-G formula based on SCr of 1.79 mg/dL (H)).  No results found for this or any previous visit (from the past 240 hours).    Radiology Studies: No results found.     LOS: 5 days    Elgin Lam, MD [facility] 08/01/2024, 2:30 PM   If 7PM-7AM, please contact  night-coverage [email or web address]  "

## 2024-08-01 NOTE — Procedures (Signed)
 Patient Name: Howard Johnson  MRN: [id number]  Epilepsy Attending: Arlin MALVA Krebs  Referring Physician/Provider: Michaela Aisha SQUIBB, MD  Duration: 07/31/2024 1805 to 08/01/2024 2345   Patient history: 68 yo man with hx DVST and SAH in Feb 2025, DM2, HL, HTN who presents with acute onset severe global aphasia. EEG to evaluate for seizure   Level of alertness: Awake, asleep   AEDs during EEG study: LEV, LCM, VPA, Clonazepam    Technical aspects: This EEG study was done with scalp electrodes positioned according to the 10-20 International system of electrode placement. Electrical activity was reviewed with band pass filter of 1-70Hz , sensitivity of 7 uV/mm, display speed of 20mm/sec with a 60Hz  notched filter applied as appropriate. EEG data were recorded continuously and digitally stored.  Video monitoring was available and reviewed as appropriate.   Description: The posterior dominant rhythm consists of 8-9 Hz activity of moderate voltage (25-35 uV) seen predominantly in posterior head regions, asymmetric ( left<right) and reactive to eye opening and eye closing.  Sleep was characterized by vertex waves, sleep spindles (12 to 14 Hz), maximal frontocentral region.  EEG showed continuous 3-5 hz theta- delta slowing in left hemisphere, maximal left temporal region.  Lateralized periodic discharges were noted in left hemisphere, maximal left temporal region at 0.5 to 1 Hz, with overlying rhythmicity and fast activity.  Hyperventilation and photic stimulation were not performed.     Of note, study was disconnected between 08/01/2024 0151 to 08/01/2024 0730 due to technical issue    ABNORMALITY - Lateralized periodic discharge with overriding fast activity and rhythmicity ( LPD+F+R ) Left hemisphere, maximal left temporal region - Continuous slow, left hemisphere, maximal left temporal region   IMPRESSION: This study showed evidence of epileptogenicity arising from left hemisphere, maximal left  temporal region. This EEG pattern is on the ictal-interictal continuum with increased risk of seizure recurrence. Additionally there is cortical dysfunction in left hemisphere, maximal left temporal region likely secondary to underlying structural abnormality, seizures.    [name]

## 2024-08-02 ENCOUNTER — Inpatient Hospital Stay (HOSPITAL_COMMUNITY)

## 2024-08-02 DIAGNOSIS — N1831 Chronic kidney disease, stage 3a: Secondary | ICD-10-CM | POA: Diagnosis not present

## 2024-08-02 DIAGNOSIS — I10 Essential (primary) hypertension: Secondary | ICD-10-CM | POA: Diagnosis not present

## 2024-08-02 DIAGNOSIS — G40909 Epilepsy, unspecified, not intractable, without status epilepticus: Secondary | ICD-10-CM | POA: Diagnosis not present

## 2024-08-02 DIAGNOSIS — I69398 Other sequelae of cerebral infarction: Secondary | ICD-10-CM | POA: Diagnosis not present

## 2024-08-02 DIAGNOSIS — R569 Unspecified convulsions: Secondary | ICD-10-CM | POA: Diagnosis not present

## 2024-08-02 DIAGNOSIS — R4701 Aphasia: Secondary | ICD-10-CM | POA: Diagnosis not present

## 2024-08-02 LAB — CBC
HCT: 52.9 % — ABNORMAL HIGH (ref 39.0–52.0)
Hemoglobin: 17.4 g/dL — ABNORMAL HIGH (ref 13.0–17.0)
MCH: 28.5 pg (ref 26.0–34.0)
MCHC: 32.9 g/dL (ref 30.0–36.0)
MCV: 86.6 fL (ref 80.0–100.0)
Platelets: 355 K/uL (ref 150–400)
RBC: 6.11 MIL/uL — ABNORMAL HIGH (ref 4.22–5.81)
RDW: 13.7 % (ref 11.5–15.5)
WBC: 10.1 K/uL (ref 4.0–10.5)
nRBC: 0 % (ref 0.0–0.2)

## 2024-08-02 LAB — BASIC METABOLIC PANEL WITH GFR
Anion gap: 9 (ref 5–15)
BUN: 38 mg/dL — ABNORMAL HIGH (ref 8–23)
CO2: 28 mmol/L (ref 22–32)
Calcium: 9.4 mg/dL (ref 8.9–10.3)
Chloride: 105 mmol/L (ref 98–111)
Creatinine, Ser: 1.81 mg/dL — ABNORMAL HIGH (ref 0.61–1.24)
GFR, Estimated: 40 mL/min — ABNORMAL LOW
Glucose, Bld: 107 mg/dL — ABNORMAL HIGH (ref 70–99)
Potassium: 3.9 mmol/L (ref 3.5–5.1)
Sodium: 142 mmol/L (ref 135–145)

## 2024-08-02 LAB — GLUCOSE, CAPILLARY
Glucose-Capillary: 125 mg/dL — ABNORMAL HIGH (ref 70–99)
Glucose-Capillary: 117 mg/dL — ABNORMAL HIGH (ref 70–99)
Glucose-Capillary: 96 mg/dL (ref 70–99)
Glucose-Capillary: 110 mg/dL — ABNORMAL HIGH (ref 70–99)

## 2024-08-02 NOTE — Progress Notes (Signed)
 Subjective: No significant changes  ROS: Unable to obtain due to aphasia  Examination  Vital signs in last 24 hours: Temp:  [97.7 F (36.5 C)-99.1 F (37.3 C)] 97.8 F (36.6 C) (12/21 1607) Pulse Rate:  [62-74] 65 (12/21 0842) Resp:  [16-20] 20 (12/21 1607) BP: (93-139)/(62-80) 139/78 (12/21 1607) SpO2:  [93 %-100 %] 100 % (12/21 1607)  General: lying in bed, NAD Neuro: awake, alert, significant difficulty with expression and perseveration, can mimic, PERLA, EOMI,unable to assess visual fields, moving all extremities well, no drift in upper extremities  Basic Metabolic Panel: Recent Labs  Lab 07/27/24 1030 07/27/24 1039 07/30/24 1134 08/02/24 1414  NA 140 141 139 142  K 4.1 4.0 3.8 3.9  CL 104 104 105 105  CO2 21*  --  23 28  GLUCOSE 133* 128* 133* 107*  BUN 28* 31* 33* 38*  CREATININE 1.66* 1.80* 1.79* 1.81*  CALCIUM  9.2  --  9.7 9.4    CBC: Recent Labs  Lab 07/27/24 1030 07/27/24 1039 08/02/24 1414  WBC 10.0  --  10.1  NEUTROABS 8.4*  --   --   HGB 16.7 17.3* 17.4*  HCT 52.1* 51.0 52.9*  MCV 87.0  --  86.6  PLT 379  --  355    Imaging No new brain imaging overnight     ASSESSMENT AND PLAN:68 y.o. 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   He continues to have periodic discharges on EEG, though lacking the concerning features for an ictal nature that was seen earlier in his hospitalization.  It still is technically on the ictal-interictal continuum, and his symptoms are still very persistent.    Status epilepticus, resolved Seizures due to underlying stroke - Did not have any more definite seizures overnight.  However continues to have epileptiform discharges. His aphasia appears to be slightly improving.  I will give him an additional dose of 0.5 mg clonazepam  this morning as well as 1 mg clonazepam  tonight to see if this will help with improvement in lateralized periodic  discharges without causing excessive sedation - Continue current dose of Keppra , Vimpat   - Discontinue Depakote - Continue perampanel  2 mg nightly - Continue LTM EEG while we adjust anti-seizure medication -continue seizure precautions   Aisha Seals, MD [facility]   If 7pm- 7am, please page neurology on call as listed in AMION.

## 2024-08-02 NOTE — Progress Notes (Signed)
 LTM maint complete. Skin breakdown seen under Fp2, A2, Pz - electrodes moved accordingly. Head wrap fixed.

## 2024-08-02 NOTE — Procedures (Signed)
 Patient Name: Howard Johnson  MRN: [id number]  Epilepsy Attending: Arlin MALVA Krebs  Referring Physician/Provider: Michaela Aisha SQUIBB, MD  Duration: 08/01/2024 2345 to 08/02/2024 2345   Patient history: 68 yo man with hx DVST and SAH in Feb 2025, DM2, HL, HTN who presents with acute onset severe global aphasia. EEG to evaluate for seizure   Level of alertness: Awake, asleep   AEDs during EEG study: LEV, LCM, VPA, Clonazepam    Technical aspects: This EEG study was done with scalp electrodes positioned according to the 10-20 International system of electrode placement. Electrical activity was reviewed with band pass filter of 1-70Hz , sensitivity of 7 uV/mm, display speed of 30mm/sec with a 60Hz  notched filter applied as appropriate. EEG data were recorded continuously and digitally stored.  Video monitoring was available and reviewed as appropriate.   Description: The posterior dominant rhythm consists of 8-9 Hz activity of moderate voltage (25-35 uV) seen predominantly in posterior head regions, asymmetric ( left<right) and reactive to eye opening and eye closing. Sleep was characterized by vertex waves, sleep spindles (12 to 14 Hz), maximal frontocentral region. EEG showed continuous 3-5 hz theta- delta slowing in left hemisphere, maximal left temporal region.  Lateralized periodic discharges were noted in left hemisphere, maximal left temporal region at 0.5 to 1 Hz, with overlying fast activity.  Hyperventilation and photic stimulation were not performed.     ABNORMALITY - Lateralized periodic discharge with overriding fast activity ( LPD+F) Left hemisphere, maximal left temporal region - Continuous slow, left hemisphere, maximal left temporal region   IMPRESSION: This study showed evidence of epileptogenicity arising from left hemisphere, maximal left temporal region. This EEG pattern is on the ictal-interictal continuum with increased risk of seizure recurrence. Additionally there is cortical  dysfunction in left hemisphere, maximal left temporal region likely secondary to underlying structural abnormality, seizures.    Roxanne Orner O Oshua Mccon[place]

## 2024-08-02 NOTE — Progress Notes (Signed)
 "  PROGRESS NOTE    Howard Johnson  FMW:[id number] DOB: 07-Sep-1955 DOA: 07/27/2024 PCP: Shona Norleen PEDLAR, MD   Brief Narrative: Howard Johnson is a 68 y.o. male with a history of left temporal lobe intracranial bleeding, cerebral venous thrombosis, diabetes mellitus type 2, hypertension, CKD stage IIIa, hyperlipidemia.  Patient presented secondary to aphasia with concern for stroke.  Stroke workup was negative however seizure workup revealed evidence of status epilepticus.  Neurology started patient on antiepileptic drug therapy for management and are currently titrating medications.   Assessment and Plan:  Status epilepticus Patient noted to have very frequent seizures while on LTM EEG during workup for aphasia.  Neurology have started him on Keppra , Vimpat  and valproate for management.  Patient still with seizures overnight. -Neurology recommendations (12/20): Continue Vimpat , clonazepam  and Keppra ; discontinue Depakote, continue LTM EEG. Recommendations pending today.  Aphasia Palilalia Secondary to uncontrolled seizures. Some improvement with ongoing treatment of seizures.  History of left temporal intracranial bleeding History of cerebral venous thrombosis Noted. Prior to arrival medication(s) include Eliquis . -Continue Eliquis   Diabetes mellitus type 2 Well controlled based on hemoglobin A1C of 5.7%. prior to arrival medication(s) include semaglutide although is possibly transitioned to Lantus . Unlikely that patient needs insulin  therapy for his current A1C level. -Continue SSI  CKD stage IIIa Baseline creatinine appears to be between 1.5 to 1.7. Creatinine of 1.66 on admission and slightly up to 1.8 and but is stable.  Primary hypertension Prior to arrival medication(s) include lisinopril . -Continue lisinopril   Hyperlipidemia Prior to arrival medication(s) include Crestor . -Continue Crestor     DVT prophylaxis: Eliquis  Code Status:   Code Status: Limited: Do not attempt  resuscitation (DNR) -DNR-LIMITED -Do Not Intubate/DNI  Family Communication: None at bedside Disposition Plan: Discharge home with outpatient PT pending ongoing neurology recommendations/management   Consultants:  Neurology  Procedures:  LTM EEG  Antimicrobials: None    Subjective: No issues noted from overnight.  Objective: BP 121/80 (BP Location: Left Arm)   Pulse 65   Temp 97.7 F (36.5 C) (Oral)   Resp 18   Ht 6' 4 (1.93 m)   Wt 97.5 kg   SpO2 94%   BMI 26.16 kg/m   Examination:  General exam: Appears calm and comfortable. Attached to EEG. Respiratory system: Respiratory effort normal.   Data Reviewed: I have personally reviewed following labs and imaging studies  CBC Lab Results  Component Value Date   WBC 10.0 07/27/2024   RBC 5.99 (H) 07/27/2024   HGB 17.3 (H) 07/27/2024   HCT 51.0 07/27/2024   MCV 87.0 07/27/2024   MCH 27.9 07/27/2024   PLT 379 07/27/2024   MCHC 32.1 07/27/2024   RDW 13.8 07/27/2024   LYMPHSABS 0.7 07/27/2024   MONOABS 0.6 07/27/2024   EOSABS 0.1 07/27/2024   BASOSABS 0.1 07/27/2024     Last metabolic panel Lab Results  Component Value Date   NA 139 07/30/2024   K 3.8 07/30/2024   CL 105 07/30/2024   CO2 23 07/30/2024   BUN 33 (H) 07/30/2024   CREATININE 1.79 (H) 07/30/2024   GLUCOSE 133 (H) 07/30/2024   GFRNONAA 40 (L) 07/30/2024   CALCIUM  9.7 07/30/2024   PHOS 3.3 09/27/2023   PROT 7.1 07/27/2024   ALBUMIN 4.8 07/27/2024   LABGLOB 2.2 11/23/2020   AGRATIO 2.2 11/23/2020   BILITOT 0.5 07/27/2024   ALKPHOS 96 07/27/2024   AST 24 07/27/2024   ALT 21 07/27/2024   ANIONGAP 11 07/30/2024    GFR: Estimated Creatinine Clearance:  47.1 mL/min (A) (by C-G formula based on SCr of 1.79 mg/dL (H)).  No results found for this or any previous visit (from the past 240 hours).    Radiology Studies: No results found.     LOS: 6 days    Elgin Lam, MD [facility] 08/02/2024, 12:42 PM   If 7PM-7AM,  please contact night-coverage [email or web address]  "

## 2024-08-03 ENCOUNTER — Inpatient Hospital Stay (HOSPITAL_COMMUNITY)

## 2024-08-03 DIAGNOSIS — R4701 Aphasia: Secondary | ICD-10-CM | POA: Diagnosis not present

## 2024-08-03 DIAGNOSIS — I10 Essential (primary) hypertension: Secondary | ICD-10-CM | POA: Diagnosis not present

## 2024-08-03 DIAGNOSIS — R569 Unspecified convulsions: Secondary | ICD-10-CM | POA: Diagnosis not present

## 2024-08-03 DIAGNOSIS — N1831 Chronic kidney disease, stage 3a: Secondary | ICD-10-CM | POA: Diagnosis not present

## 2024-08-03 DIAGNOSIS — G40909 Epilepsy, unspecified, not intractable, without status epilepticus: Secondary | ICD-10-CM | POA: Diagnosis not present

## 2024-08-03 DIAGNOSIS — I69398 Other sequelae of cerebral infarction: Secondary | ICD-10-CM | POA: Diagnosis not present

## 2024-08-03 DIAGNOSIS — E1122 Type 2 diabetes mellitus with diabetic chronic kidney disease: Secondary | ICD-10-CM | POA: Diagnosis not present

## 2024-08-03 LAB — GLUCOSE, CAPILLARY
Glucose-Capillary: 92 mg/dL (ref 70–99)
Glucose-Capillary: 151 mg/dL — ABNORMAL HIGH (ref 70–99)
Glucose-Capillary: 81 mg/dL (ref 70–99)
Glucose-Capillary: 109 mg/dL — ABNORMAL HIGH (ref 70–99)

## 2024-08-03 NOTE — Progress Notes (Signed)
 Subjective: NAEO. Wife and son at bedside  ROS: Unable to obtain due to aphasia  Examination  Vital signs in last 24 hours: Temp:  [97.6 F (36.4 C)-98.3 F (36.8 C)] 98 F (36.7 C) (12/22 0831) Pulse Rate:  [57-73] 73 (12/22 1140) Resp:  [17-20] 17 (12/22 1140) BP: (97-139)/(62-78) 125/77 (12/22 1140) SpO2:  [93 %-100 %] 98 % (12/22 1140)  General: sitting in bed, eating lunch Neuro: MS: Alert, oriented to self and place, not time, follows commands at times like did show me two fingers and stick out his tongue but then started perseverating  CN: pupils equal and reactive,  EOMI, face symmetric, tongue midline, normal sensation over face, Motor: 5/5 strength in all 4 extremities Coordination: normal Gait: not tested  Basic Metabolic Panel: Recent Labs  Lab 07/30/24 1134 08/02/24 1414  NA 139 142  K 3.8 3.9  CL 105 105  CO2 23 28  GLUCOSE 133* 107*  BUN 33* 38*  CREATININE 1.79* 1.81*  CALCIUM  9.7 9.4    CBC: Recent Labs  Lab 08/02/24 1414  WBC 10.1  HGB 17.4*  HCT 52.9*  MCV 86.6  PLT 355     Coagulation Studies: No results for input(s): LABPROT, INR in the last 72 hours.  Imaging No new brain imaging overnight     ASSESSMENT AND PLAN:67 y.o. 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    Status epilepticus, resolved Seizures due to underlying stroke - Continue current dose of Keppra , Vimpat , Perampanel  and clonazepam  - DC LTM eeg - may consider MRI brain if aphasia persists - Continue seizure precautions -Discussed plan with hospitalist via secure chat - Rest per primary team       I personally spent a total of 35 minutes in the care of the patient today including getting/reviewing separately obtained history, performing a medically appropriate exam/evaluation, counseling and educating, placing orders, referring and communicating with other health care professionals,  documenting clinical information in the EHR, independently interpreting results, and coordinating care.       Arlin Krebs Epilepsy [facility] For questions after 5pm please refer to AMION to reach the Neurologist on call

## 2024-08-03 NOTE — Progress Notes (Signed)
 LTM maint complete. Skin breakdown seen under A1, A2, F8, C4 - electrodes moved accordingly. Head re-wrapped.

## 2024-08-03 NOTE — Progress Notes (Signed)
 Physical Therapy Treatment Patient Details Name: Howard Johnson MRN: [id number] DOB: February 04, 1956 Today's Date: 08/03/2024   History of Present Illness 68 y.o. male presents to [facility] on 07/29/2024 with acute onset severe global aphasia. Imaging negative for acute findings, expected evolution left temporal lobe hematoma since February. Multiple seizures noted on LTM EEG. PMHx: L temporal ICH, cerebral venous thrombosis, T2DM, HTN, CKD III, and HLD.    PT Comments  Pt supine in bed on arrival this session.  Pt sleeping he briefly opened eyes but then kept them closed for most of active mobility.  He did follow commands but lacks strength and required increased assistance in comparison to last PT session.  He performed brief OOB mobility and returned to focus on R LE strengthening this session.      If plan is discharge home, recommend the following: A little help with walking and/or transfers;A little help with bathing/dressing/bathroom;Assistance with cooking/housework;Assist for transportation;Help with stairs or ramp for entrance;Supervision due to cognitive status;Direct supervision/assist for medications management   Can travel by private vehicle        Equipment Recommendations       Recommendations for Other Services       Precautions / Restrictions Precautions Precautions: Fall Restrictions Weight Bearing Restrictions Per Provider Order: No     Mobility  Bed Mobility Overal bed mobility: Needs Assistance Bed Mobility: Supine to Sit, Sit to Supine     Supine to sit: Mod assist, Max assist Sit to supine: Max assist   General bed mobility comments: Increased assistance to move into sitting due to lethargic presentation.  Pt required assistance for LE advancement in and out of bed and trunk elevation.  Once in sitting presents with forward flexion but able to maintain balance.  cues for trunk extension and forward gaze.    Transfers Overall transfer level: Needs  assistance Equipment used: None (face to face assistance to rise into standing.)   Sit to Stand: Mod assist           General transfer comment: Pt required assistance increased from last session to rise into standing.  In standing presents with posterior bias and forward flexion.    Ambulation/Gait Ambulation/Gait assistance: Mod assist Gait Distance (Feet): 4 Feet (steps to the L to [facility].  Unable to progress further as he would not open his eyes and appeared lethargic.  Attempted to the R but he was unable to negotiate RLE to the R.) Assistive device: None Gait Pattern/deviations: Step-through pattern, Decreased stride length Gait velocity: reduced     General Gait Details: Short bout of side stepping to left with face to face assistance.  He was unable to side step to the R side.   Stairs             Wheelchair Mobility     Tilt Bed    Modified Rankin (Stroke Patients Only) Modified Rankin (Stroke Patients Only) Modified Rankin: Moderately severe disability     Balance Overall balance assessment: Needs assistance Sitting-balance support: No upper extremity supported, Feet supported Sitting balance-Leahy Scale: Fair Sitting balance - Comments: Pt sat EOB with close supervision   Standing balance support: During functional activity, No upper extremity supported Standing balance-Leahy Scale: Fair Standing balance comment: Pt required CGA during gait and had 2 LOB requiring minA to stabilize.                            Communication Communication Communication: Impaired Factors  Affecting Communication: Difficulty expressing self;Reduced clarity of speech (severe expressive and receptive aphasia)  Cognition Arousal: Lethargic Behavior During Therapy: Flat affect   PT - Cognitive impairments: Difficult to assess, No family/caregiver present to determine baseline, Initiation, Sequencing, Problem solving, Safety/Judgement Difficult to assess due to:  Impaired communication                     PT - Cognition Comments: Pt with eyes closed during session.  Despite cues kept them closed throughout so further gt deferred this session. Following commands: Impaired Following commands impaired: Follows one step commands with increased time    Cueing Cueing Techniques: Verbal cues, Gestural cues, Visual cues, Tactile cues  Exercises General Exercises - Lower Extremity Heel Slides: AAROM, Strengthening, Right, 10 reps, Supine (assistance into flexion, resistance into extension.) Hip ABduction/ADduction: Right, 10 reps, AAROM, Supine Other Exercises Other Exercises: Pt supine in bed performed B bridging in B hips in supine.    General Comments        Pertinent Vitals/Pain Pain Assessment Pain Assessment: Faces Faces Pain Scale: No hurt    Home Living Family/patient expects to be discharged to:: Private residence Living Arrangements: Children;Spouse/significant other                      Prior Function            PT Goals (current goals can now be found in the care plan section) Acute Rehab PT Goals Patient Stated Goal: Return Home Potential to Achieve Goals: Good Progress towards PT goals: Progressing toward goals    Frequency    Min 2X/week      PT Plan      Co-evaluation              AM-PAC PT 6 Clicks Mobility   Outcome Measure  Help needed turning from your back to your side while in a flat bed without using bedrails?: A Little Help needed moving from lying on your back to sitting on the side of a flat bed without using bedrails?: A Little Help needed moving to and from a bed to a chair (including a wheelchair)?: A Little Help needed standing up from a chair using your arms (e.g., wheelchair or bedside chair)?: A Little Help needed to walk in hospital room?: A Little Help needed climbing 3-5 steps with a railing? : A Lot 6 Click Score: 17    End of Session Equipment Utilized During  Treatment: Gait belt Activity Tolerance: Patient tolerated treatment well Patient left: in bed;with call bell/phone within reach;with bed alarm set Nurse Communication: Mobility status PT Visit Diagnosis: Difficulty in walking, not elsewhere classified (R26.2);Other abnormalities of gait and mobility (R26.89);Unsteadiness on feet (R26.81)     Time: 8479-8454 PT Time Calculation (min) (ACUTE ONLY): 25 min  Charges:    $Gait Training: 8-22 mins $Therapeutic Exercise: 8-22 mins PT General Charges $$ ACUTE PT VISIT: 1 Visit                     Toya HAMS , PTA Acute Rehabilitation Services Office [phone]    Toya JINNY Gosling 08/03/2024, 4:09 PM

## 2024-08-03 NOTE — Progress Notes (Signed)
 "  PROGRESS NOTE    Howard Johnson  FMW:[id number] DOB: 11-28-1955 DOA: 07/27/2024 PCP: Shona Norleen PEDLAR, MD   Brief Narrative: Howard Johnson is a 68 y.o. male with a history of left temporal lobe intracranial bleeding, cerebral venous thrombosis, diabetes mellitus type 2, hypertension, CKD stage IIIa, hyperlipidemia.  Patient presented secondary to aphasia with concern for stroke.  Stroke workup was negative however seizure workup revealed evidence of status epilepticus.  Neurology started patient on antiepileptic drug therapy for management and are currently titrating medications.   Assessment and Plan:  Status epilepticus Patient noted to have very frequent seizures while on LTM EEG during workup for aphasia.  Neurology have started him on Keppra , Vimpat  and valproate for management.  Patient still with seizures overnight. -Neurology recommendations (12/22): Continue Vimpat , clonazepam  and Keppra ; possible repeat MRI brain, stop EEG  Aphasia Palilalia Secondary to uncontrolled seizures. Some improvement with ongoing treatment of seizures.  History of left temporal intracranial bleeding History of cerebral venous thrombosis Noted. Prior to arrival medication(s) include Eliquis . -Continue Eliquis   Diabetes mellitus type 2 Well controlled based on hemoglobin A1C of 5.7%. prior to arrival medication(s) include semaglutide although is possibly transitioned to Lantus . Unlikely that patient needs insulin  therapy for his current A1C level. -Continue SSI  CKD stage IIIa Baseline creatinine appears to be between 1.5 to 1.7. Creatinine of 1.66 on admission and slightly up to 1.8 and but is stable.  Primary hypertension Prior to arrival medication(s) include lisinopril . -Continue lisinopril   Hyperlipidemia Prior to arrival medication(s) include Crestor . -Continue Crestor     DVT prophylaxis: Eliquis  Code Status:   Code Status: Limited: Do not attempt resuscitation (DNR) -DNR-LIMITED -Do Not  Intubate/DNI  Family Communication: None at bedside. Called wife, however no response Disposition Plan: Discharge home with outpatient PT pending ongoing neurology recommendations/management   Consultants:  Neurology  Procedures:  LTM EEG  Antimicrobials: None    Subjective: No events from overnight. Patient still with aphasia; difficult to gain history.  Objective: BP 116/68 (BP Location: Left Arm)   Pulse 60   Temp 98 F (36.7 C) (Oral)   Resp 19   Ht 6' 4 (1.93 m)   Wt 97.5 kg   SpO2 97%   BMI 26.16 kg/m   Examination:  General exam: Appears calm and comfortable. Connected to EEG Respiratory system: Respiratory effort normal. Central nervous system: Alert. Aphasia.   Data Reviewed: I have personally reviewed following labs and imaging studies  CBC Lab Results  Component Value Date   WBC 10.1 08/02/2024   RBC 6.11 (H) 08/02/2024   HGB 17.4 (H) 08/02/2024   HCT 52.9 (H) 08/02/2024   MCV 86.6 08/02/2024   MCH 28.5 08/02/2024   PLT 355 08/02/2024   MCHC 32.9 08/02/2024   RDW 13.7 08/02/2024   LYMPHSABS 0.7 07/27/2024   MONOABS 0.6 07/27/2024   EOSABS 0.1 07/27/2024   BASOSABS 0.1 07/27/2024     Last metabolic panel Lab Results  Component Value Date   NA 142 08/02/2024   K 3.9 08/02/2024   CL 105 08/02/2024   CO2 28 08/02/2024   BUN 38 (H) 08/02/2024   CREATININE 1.81 (H) 08/02/2024   GLUCOSE 107 (H) 08/02/2024   GFRNONAA 40 (L) 08/02/2024   CALCIUM  9.4 08/02/2024   PHOS 3.3 09/27/2023   PROT 7.1 07/27/2024   ALBUMIN 4.8 07/27/2024   LABGLOB 2.2 11/23/2020   AGRATIO 2.2 11/23/2020   BILITOT 0.5 07/27/2024   ALKPHOS 96 07/27/2024   AST 24 07/27/2024  ALT 21 07/27/2024   ANIONGAP 9 08/02/2024    GFR: Estimated Creatinine Clearance: 46.6 mL/min (A) (by C-G formula based on SCr of 1.81 mg/dL (H)).  No results found for this or any previous visit (from the past 240 hours).    Radiology Studies: No results found.     LOS: 7 days     Elgin Lam, MD [facility] 08/03/2024, 10:44 AM   If 7PM-7AM, please contact night-coverage [email or web address]  "

## 2024-08-03 NOTE — Progress Notes (Signed)
 LTM EEG discontinued - no skin breakdown at [facility].

## 2024-08-03 NOTE — Procedures (Addendum)
 Patient Name: Howard Johnson  MRN: [id number]  Epilepsy Attending: Arlin MALVA Krebs  Referring Physician/Provider: Michaela Aisha SQUIBB, MD  Duration: 08/02/2024 2345 to 08/03/2024 1346   Patient history: 68 yo man with hx DVST and SAH in Feb 2025, DM2, HL, HTN who presents with acute onset severe global aphasia. EEG to evaluate for seizure   Level of alertness: Awake, asleep   AEDs during EEG study: LEV, LCM, Perampanel , Clonazepam    Technical aspects: This EEG study was done with scalp electrodes positioned according to the 10-20 International system of electrode placement. Electrical activity was reviewed with band pass filter of 1-70Hz , sensitivity of 7 uV/mm, display speed of 71mm/sec with a 60Hz  notched filter applied as appropriate. EEG data were recorded continuously and digitally stored.  Video monitoring was available and reviewed as appropriate.   Description: The posterior dominant rhythm consists of 8-9 Hz activity of moderate voltage (25-35 uV) seen predominantly in posterior head regions, asymmetric ( left<right) and reactive to eye opening and eye closing. Sleep was characterized by vertex waves, sleep spindles (12 to 14 Hz), maximal frontocentral region. EEG showed continuous 3-5 hz theta- delta slowing in left hemisphere, maximal left temporal region.  Lateralized periodic discharges were noted in left hemisphere, maximal left temporal region at 0.5 to 1 Hz, with overlying fast activity.  Hyperventilation and photic stimulation were not performed.      ABNORMALITY - Lateralized periodic discharge with overriding fast activity ( LPD+F) Left hemisphere, maximal left temporal region - Continuous slow, left hemisphere, maximal left temporal region   IMPRESSION: This study showed evidence of epileptogenicity arising from left hemisphere, maximal left temporal region. This EEG pattern is on the ictal-interictal continuum with increased risk of seizure recurrence. Additionally there is  cortical dysfunction in left hemisphere, maximal left temporal region likely secondary to underlying structural abnormality, seizures.    Channie Bostick O Martavis Gurney

## 2024-08-03 NOTE — TOC Progression Note (Signed)
 Transition of Care [facility]) - Progression Note    Patient Details  Name: Ronnel Zuercher MRN: [id number] Date of Birth: [birth year]  Transition of Care [facility]) CM/SW Contact  Andrez JULIANNA George, RN Phone Number: 08/03/2024, 3:00 PM  Clinical Narrative:     Pt off EEG. Pt continues with expressive aphasia.  IP Care management following.   Expected Discharge Plan: OP Rehab Barriers to Discharge: Continued Medical Work up               Expected Discharge Plan and Services       Living arrangements for the past 2 months: Single Family Home                                       Social Drivers of Health (SDOH) Interventions SDOH Screenings   Food Insecurity: Patient Unable To Answer (07/29/2024)  Housing: Low Risk (08/03/2024)  Transportation Needs: Patient Unable To Answer (07/29/2024)  Utilities: Patient Unable To Answer (07/29/2024)  Depression (PHQ2-9): Low Risk (10/16/2023)  Social Connections: Socially Integrated (08/03/2024)  Tobacco Use: Low Risk (07/27/2024)    Readmission Risk Interventions     No data to display

## 2024-08-04 ENCOUNTER — Other Ambulatory Visit (HOSPITAL_COMMUNITY): Payer: Self-pay

## 2024-08-04 ENCOUNTER — Telehealth (HOSPITAL_COMMUNITY): Payer: Self-pay | Admitting: Pharmacy Technician

## 2024-08-04 DIAGNOSIS — N1831 Chronic kidney disease, stage 3a: Secondary | ICD-10-CM | POA: Diagnosis not present

## 2024-08-04 DIAGNOSIS — I69398 Other sequelae of cerebral infarction: Secondary | ICD-10-CM | POA: Diagnosis not present

## 2024-08-04 DIAGNOSIS — R569 Unspecified convulsions: Secondary | ICD-10-CM | POA: Diagnosis not present

## 2024-08-04 DIAGNOSIS — I10 Essential (primary) hypertension: Secondary | ICD-10-CM | POA: Diagnosis not present

## 2024-08-04 DIAGNOSIS — G40909 Epilepsy, unspecified, not intractable, without status epilepticus: Secondary | ICD-10-CM | POA: Diagnosis not present

## 2024-08-04 DIAGNOSIS — R4701 Aphasia: Secondary | ICD-10-CM | POA: Diagnosis not present

## 2024-08-04 LAB — GLUCOSE, CAPILLARY
Glucose-Capillary: 113 mg/dL — ABNORMAL HIGH (ref 70–99)
Glucose-Capillary: 79 mg/dL (ref 70–99)
Glucose-Capillary: 96 mg/dL (ref 70–99)
Glucose-Capillary: 106 mg/dL — ABNORMAL HIGH (ref 70–99)

## 2024-08-04 MED ORDER — LEVETIRACETAM 500 MG PO TABS
1000.0000 mg | ORAL_TABLET | Freq: Two times a day (BID) | ORAL | Status: DC
  Administered 2024-08-04 – 2024-08-05 (×2): 1000 mg via ORAL
  Filled 2024-08-04 (×2): qty 2

## 2024-08-04 MED ORDER — DIAZEPAM (20 MG DOSE) 2 X 10 MG/0.1ML NA LQPK
20.0000 mg | NASAL | 3 refills | Status: AC | PRN
  Filled 2024-08-04: qty 5, 30d supply, fill #0

## 2024-08-04 MED ORDER — POLYETHYLENE GLYCOL 3350 17 G PO PACK
17.0000 g | PACK | Freq: Every day | ORAL | Status: DC
  Administered 2024-08-04 – 2024-08-05 (×2): 17 g via ORAL
  Filled 2024-08-04 (×2): qty 1

## 2024-08-04 NOTE — Progress Notes (Signed)
 Physical Therapy Treatment Patient Details Name: Howard Johnson MRN: [id number] DOB: 03/16/1956 Today's Date: 08/04/2024   History of Present Illness 68 y.o. male presents to [facility] on 07/29/2024 with acute onset severe global aphasia. Imaging negative for acute findings, expected evolution left temporal lobe hematoma since February. Multiple seizures noted on LTM EEG. PMHx: L temporal ICH, cerebral venous thrombosis, T2DM, HTN, CKD III, and HLD.    PT Comments  Received pt semi-reclined in bed and agreeable to PT treatment, but remains limited by expressive aphasia. Pt required CGA for bed mobility using bed features and min A for transfers with RW. Pt progressed with ambulation up to 116ft with RW and CGA/min A but demos mild L inattention running into object on L side of room. Anticipate is close to baseline but would benefit from short stay in intensive rehab >3 hours/day to address current impairments. Acute PT to cont to follow.     If plan is discharge home, recommend the following: A little help with walking and/or transfers;A little help with bathing/dressing/bathroom;Assistance with cooking/housework;Assist for transportation;Help with stairs or ramp for entrance;Supervision due to cognitive status;Direct supervision/assist for medications management   Can travel by private vehicle        Equipment Recommendations  Other (comment) (TBD in next venue)    Recommendations for Other Services       Precautions / Restrictions Precautions Precautions: Fall Recall of Precautions/Restrictions: Impaired Precaution/Restrictions Comments: aphasia Restrictions Weight Bearing Restrictions Per Provider Order: No     Mobility  Bed Mobility Overal bed mobility: Needs Assistance Bed Mobility: Supine to Sit, Sit to Supine, Rolling Rolling: Supervision, Used rails   Supine to sit: Contact guard, HOB elevated, Used rails Sit to supine: Contact guard assist   General bed mobility comments: ++  time and CGA for balance but no true physical assist Patient Response: Cooperative  Transfers Overall transfer level: Needs assistance Equipment used: Rolling walker (2 wheels) Transfers: Sit to/from Stand Sit to Stand: Min assist           General transfer comment: Stood from low sitting EOB with RW and min A with cues for hand placement    Ambulation/Gait Ambulation/Gait assistance: Editor, Commissioning (Feet): 190 Feet Assistive device: Rolling walker (2 wheels) Gait Pattern/deviations: Step-through pattern, Decreased stride length, Step-to pattern, Decreased step length - right, Decreased step length - left, Trunk flexed, Narrow base of support Gait velocity: reduced Gait velocity interpretation: <1.31 ft/sec, indicative of household ambulator   General Gait Details: no LOB noted but pt demo L inattention running RW into objects on L side of room   Stairs             Wheelchair Mobility     Tilt Bed Tilt Bed Patient Response: Cooperative  Modified Rankin (Stroke Patients Only) Modified Rankin (Stroke Patients Only) Pre-Morbid Rankin Score: No symptoms Modified Rankin: Moderately severe disability     Balance Overall balance assessment: Needs assistance Sitting-balance support: No upper extremity supported, Feet supported Sitting balance-Leahy Scale: Fair Sitting balance - Comments: Pt sat EOB with close supervision   Standing balance support: During functional activity, No upper extremity supported, Reliant on assistive device for balance (RW) Standing balance-Leahy Scale: Poor Standing balance comment: required CGA for static standing balance and CGA/light min A for dynamic standing balance                            Communication Communication Communication: Impaired Factors Affecting  Communication: Difficulty expressing self;Reduced clarity of speech  Cognition Arousal: Alert Behavior During Therapy: Flat affect   PT - Cognitive  impairments: Difficult to assess, No family/caregiver present to determine baseline, Initiation, Sequencing, Problem solving, Safety/Judgement Difficult to assess due to: Impaired communication                     PT - Cognition Comments: aphasia Following commands: Impaired Following commands impaired: Follows one step commands with increased time, Follows one step commands inconsistently    Cueing Cueing Techniques: Verbal cues, Tactile cues  Exercises      General Comments        Pertinent Vitals/Pain Pain Assessment Pain Assessment: No/denies pain    Home Living                          Prior Function            PT Goals (current goals can now be found in the care plan section) Acute Rehab PT Goals Patient Stated Goal: Return Home PT Goal Formulation: With patient Time For Goal Achievement: 08/13/24 Potential to Achieve Goals: Good Progress towards PT goals: Progressing toward goals    Frequency    Min 2X/week      PT Plan      Co-evaluation              AM-PAC PT 6 Clicks Mobility   Outcome Measure  Help needed turning from your back to your side while in a flat bed without using bedrails?: A Little Help needed moving from lying on your back to sitting on the side of a flat bed without using bedrails?: A Little Help needed moving to and from a bed to a chair (including a wheelchair)?: A Little Help needed standing up from a chair using your arms (e.g., wheelchair or bedside chair)?: A Little Help needed to walk in hospital room?: A Little Help needed climbing 3-5 steps with a railing? : A Lot 6 Click Score: 17    End of Session Equipment Utilized During Treatment: Gait belt Activity Tolerance: Patient tolerated treatment well Patient left: with call bell/phone within reach;in chair;with chair alarm set Nurse Communication: Mobility status PT Visit Diagnosis: Difficulty in walking, not elsewhere classified (R26.2);Other  abnormalities of gait and mobility (R26.89);Unsteadiness on feet (R26.81);Muscle weakness (generalized) (M62.81)     Time: 9241-9184 PT Time Calculation (min) (ACUTE ONLY): 17 min  Charges:    $Therapeutic Activity: 8-22 mins PT General Charges $$ ACUTE PT VISIT: 1 Visit                     Therisa Stains PT, DPT Therisa HERO Zaunegger 08/04/2024, 10:18 AM

## 2024-08-04 NOTE — Plan of Care (Signed)

## 2024-08-04 NOTE — Progress Notes (Signed)
 "  PROGRESS NOTE    Howard Johnson  FMW:[id number] DOB: Dec 07, 1955 DOA: 07/27/2024 PCP: Shona Norleen PEDLAR, MD   Brief Narrative: Howard Johnson is a 68 y.o. male with a history of left temporal lobe intracranial bleeding, cerebral venous thrombosis, diabetes mellitus type 2, hypertension, CKD stage IIIa, hyperlipidemia.  Patient presented secondary to aphasia with concern for stroke.  Stroke workup was negative however seizure workup revealed evidence of status epilepticus.  Neurology started patient on antiepileptic drug therapy for management. EEG discontinued.   Assessment and Plan:  Status epilepticus Patient noted to have very frequent seizures while on LTM EEG during workup for aphasia.  Neurology have started him on Keppra , Vimpat  and valproate for management.  Patient still with seizures overnight. -Neurology recommendations (12/22): Continue Vimpat , clonazepam  and Keppra , perampanel   Aphasia Palilalia Secondary to uncontrolled seizures. Some improvement with ongoing treatment of seizures.  History of left temporal intracranial bleeding History of cerebral venous thrombosis Noted. Prior to arrival medication(s) include Eliquis . -Continue Eliquis   Diabetes mellitus type 2 Well controlled based on hemoglobin A1C of 5.7%. prior to arrival medication(s) include semaglutide although is possibly transitioned to Lantus . Unlikely that patient needs insulin  therapy for his current A1C level. -Continue SSI  CKD stage IIIa Baseline creatinine appears to be between 1.5 to 1.7. Creatinine of 1.66 on admission and slightly up to 1.8 and but is stable.  Primary hypertension Prior to arrival medication(s) include lisinopril . -Continue lisinopril   Hyperlipidemia Prior to arrival medication(s) include Crestor . -Continue Crestor     DVT prophylaxis: Eliquis  Code Status:   Code Status: Limited: Do not attempt resuscitation (DNR) -DNR-LIMITED -Do Not Intubate/DNI  Family Communication: None at  bedside. Disposition Plan: Discharge possible to acute inpatient rehab, if he qualifies.   Consultants:  Neurology  Procedures:  LTM EEG  Antimicrobials: None    Subjective: No issues from overnight. Patient still very aphasic.  Objective: BP (!) 109/58 (BP Location: Right Arm)   Pulse 78   Temp 98.3 F (36.8 C) (Oral)   Resp 16   Ht 6' 4 (1.93 m)   Wt 97.5 kg   SpO2 97%   BMI 26.16 kg/m   Examination:  General exam: Appears calm and comfortable. Respiratory system: Respiratory effort normal. Central nervous system: Alert. Aphasia.   Data Reviewed: I have personally reviewed following labs and imaging studies  CBC Lab Results  Component Value Date   WBC 10.1 08/02/2024   RBC 6.11 (H) 08/02/2024   HGB 17.4 (H) 08/02/2024   HCT 52.9 (H) 08/02/2024   MCV 86.6 08/02/2024   MCH 28.5 08/02/2024   PLT 355 08/02/2024   MCHC 32.9 08/02/2024   RDW 13.7 08/02/2024   LYMPHSABS 0.7 07/27/2024   MONOABS 0.6 07/27/2024   EOSABS 0.1 07/27/2024   BASOSABS 0.1 07/27/2024     Last metabolic panel Lab Results  Component Value Date   NA 142 08/02/2024   K 3.9 08/02/2024   CL 105 08/02/2024   CO2 28 08/02/2024   BUN 38 (H) 08/02/2024   CREATININE 1.81 (H) 08/02/2024   GLUCOSE 107 (H) 08/02/2024   GFRNONAA 40 (L) 08/02/2024   CALCIUM  9.4 08/02/2024   PHOS 3.3 09/27/2023   PROT 7.1 07/27/2024   ALBUMIN 4.8 07/27/2024   LABGLOB 2.2 11/23/2020   AGRATIO 2.2 11/23/2020   BILITOT 0.5 07/27/2024   ALKPHOS 96 07/27/2024   AST 24 07/27/2024   ALT 21 07/27/2024   ANIONGAP 9 08/02/2024    GFR: Estimated Creatinine Clearance: 46.6 mL/min (A) (  by C-G formula based on SCr of 1.81 mg/dL (H)).  No results found for this or any previous visit (from the past 240 hours).    Radiology Studies: No results found.     LOS: 8 days    Elgin Lam, MD [facility] 08/04/2024, 12:40 PM   If 7PM-7AM, please contact night-coverage [email or web address]  "

## 2024-08-04 NOTE — TOC Progression Note (Signed)
 Transition of Care [facility]) - Progression Note    Patient Details  Name: Altin Sease MRN: [id number] Date of Birth: March 07, 1956  Transition of [facility]) CM/SW Contact  Andrez JULIANNA George, RN Phone Number: 08/04/2024, 3:51 PM  Clinical Narrative:     Recommendations are for CIR. CIR says he is near baseline. CM spoke to pts son via phone and made him aware rehab has declined. He is in agreement with Outpatient therapy at [facility]. Referral sent and information on the AVS.  IP Care management following.  Expected Discharge Plan: OP Rehab Barriers to Discharge: Continued Medical Work up               Expected Discharge Plan and Services       Living arrangements for the past 2 months: Single Family Home                                       Social Drivers of Health (SDOH) Interventions SDOH Screenings   Food Insecurity: Patient Unable To Answer (07/29/2024)  Housing: Low Risk (08/03/2024)  Transportation Needs: Patient Unable To Answer (07/29/2024)  Utilities: Patient Unable To Answer (07/29/2024)  Depression (PHQ2-9): Low Risk (10/16/2023)  Social Connections: Socially Integrated (08/03/2024)  Tobacco Use: Low Risk (07/27/2024)    Readmission Risk Interventions     No data to display

## 2024-08-04 NOTE — Telephone Encounter (Signed)
 Patient Product/process Development Scientist completed.    The patient is insured through [employer]/ [employer]. Patient has Medicare and is not eligible for a copay card, but may be able to apply for patient assistance or Medicare RX Payment Plan (Patient Must reach out to their plan, if eligible for payment plan), if available.    Ran test claim for Valtoco  20 mg and the current 30 day co-pay is $0.00.   This test claim was processed through [facility]- copay amounts may vary at other pharmacies due to pharmacy/plan contracts, or as the patient moves through the different stages of their insurance plan.     Reyes Sharps, CPHT Pharmacy Technician Patient Advocate Specialist Lead [facility] Patient Advocate Team Direct Number: [phone]  Fax: [phone]

## 2024-08-04 NOTE — Progress Notes (Signed)
" ° °  Inpatient Rehabilitation Admissions Coordinator   Noted by PT and OT that likely close to baseline functional level. I will not pursue Cir admit at this time.  Heron Leavell, RN, MSN Rehab Admissions Coordinator [phone] 08/04/2024 11:39 AM  "

## 2024-08-04 NOTE — Progress Notes (Addendum)
 Subjective: NAEO> Worked with PT. Wife and son at bedside. Appears similar/ improving speech since yesterday  ROS: Unable to obtain due to aphasia  Examination  Vital signs in last 24 hours: Temp:  [97.5 F (36.4 C)-98.4 F (36.9 C)] 98.3 F (36.8 C) (12/23 1147) Pulse Rate:  [56-84] 78 (12/23 1147) Resp:  [15-17] 16 (12/23 1147) BP: (97-123)/(58-76) 109/58 (12/23 1147) SpO2:  [95 %-100 %] 97 % (12/23 1147)  General: sitting in chair, NAD Neuro: MS: Alert, oriented to self, did follow simple commands at times and able to understand some parts of a conversation and answer appropriately at times CN: pupils equal and reactive,  EOMI, face symmetric, tongue midline, normal sensation over face Motor: antigravity strength in all 4 extremities  Basic Metabolic Panel: Recent Labs  Lab 07/30/24 1134 08/02/24 1414  NA 139 142  K 3.8 3.9  CL 105 105  CO2 23 28  GLUCOSE 133* 107*  BUN 33* 38*  CREATININE 1.79* 1.81*  CALCIUM  9.7 9.4    CBC: Recent Labs  Lab 08/02/24 1414  WBC 10.1  HGB 17.4*  HCT 52.9*  MCV 86.6  PLT 355     Coagulation Studies: No results for input(s): LABPROT, INR in the last 72 hours.  Imaging No new brain imaging overnight     ASSESSMENT AND PLAN:68 y.o. 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 Will benefit from continued speech therapy - Continue current dose of Keppra , Vimpat , Perampanel  and clonazepam  -Rescue medication: intra nasal valtoco  20mg  for seizure lasting over 2 minutes - Continue seizure precautions - F/u with Dr Onita at [facility] -Discussed plan with hospitalist via secure chat - Rest per primary team  Seizure precautions: Per [place]  DMV statutes, patients with seizures are not allowed to drive until  they have been seizure-free for six months and cleared by a physician    Use caution when using heavy equipment or power tools. Avoid working on ladders or at heights. Take showers instead of baths. Ensure the water temperature is not too high on the home water heater. Do not go swimming alone. Do not lock yourself in a room alone (i.e. bathroom). When caring for infants or small children, sit down when holding, feeding, or changing them to minimize risk of injury to the child in the event you have a seizure. Maintain good sleep hygiene. Avoid alcohol.    If patient has another seizure, call 911 and bring them back to the ED if: A.  The seizure lasts longer than 5 minutes.      B.  The patient doesn't wake shortly after the seizure or has new problems such as difficulty seeing, speaking or moving following the seizure C.  The patient was injured during the seizure D.  The patient has a temperature over 102 F (39C) E.  The patient vomited during the seizure and now is having trouble breathing    During the Seizure   - First, ensure adequate ventilation and place patients on the floor on their left side  Loosen clothing around the neck and ensure the airway is patent. If the patient is clenching the teeth, do not force the mouth open with any object as this can cause severe damage - Remove all items from the surrounding that can be hazardous. The patient may be  oblivious to what's happening and may not even know what he or she is doing. If the patient is confused and wandering, either gently guide him/her away and block access to outside areas - Reassure the individual and be comforting - Call 911. In most cases, the seizure ends before EMS arrives. However, there are cases when seizures may last over 3 to 5 minutes. Or the individual may have developed breathing difficulties or severe injuries. If a pregnant patient or a person with diabetes develops a seizure, it is prudent to call an  ambulance.   After the Seizure (Postictal Stage)   After a seizure, most patients experience confusion, fatigue, muscle pain and/or a headache. Thus, one should permit the individual to sleep. For the next few days, reassurance is essential. Being calm and helping reorient the person is also of importance.   Most seizures are painless and end spontaneously. Seizures are not harmful to others but can lead to complications such as stress on the lungs, brain and the heart. Individuals with prior lung problems may develop labored breathing and respiratory distress.             I personally spent a total of 36 minutes in the care of the patient today including getting/reviewing separately obtained history, performing a medically appropriate exam/evaluation, counseling and educating, placing orders, referring and communicating with other health care professionals, documenting clinical information in the EHR, independently interpreting results, and coordinating care.      Arlin Krebs Epilepsy [facility] For questions after 5pm please refer to AMION to reach the Neurologist on call

## 2024-08-04 NOTE — Plan of Care (Signed)

## 2024-08-05 ENCOUNTER — Other Ambulatory Visit (HOSPITAL_COMMUNITY): Payer: Self-pay

## 2024-08-05 DIAGNOSIS — N189 Chronic kidney disease, unspecified: Secondary | ICD-10-CM | POA: Diagnosis not present

## 2024-08-05 DIAGNOSIS — N1831 Chronic kidney disease, stage 3a: Secondary | ICD-10-CM | POA: Diagnosis not present

## 2024-08-05 DIAGNOSIS — E1122 Type 2 diabetes mellitus with diabetic chronic kidney disease: Secondary | ICD-10-CM | POA: Diagnosis not present

## 2024-08-05 DIAGNOSIS — R569 Unspecified convulsions: Secondary | ICD-10-CM | POA: Diagnosis not present

## 2024-08-05 DIAGNOSIS — I10 Essential (primary) hypertension: Secondary | ICD-10-CM | POA: Diagnosis not present

## 2024-08-05 DIAGNOSIS — D6859 Other primary thrombophilia: Secondary | ICD-10-CM | POA: Diagnosis not present

## 2024-08-05 DIAGNOSIS — R4701 Aphasia: Secondary | ICD-10-CM | POA: Diagnosis not present

## 2024-08-05 LAB — GLUCOSE, CAPILLARY
Glucose-Capillary: 95 mg/dL (ref 70–99)
Glucose-Capillary: 161 mg/dL — ABNORMAL HIGH (ref 70–99)

## 2024-08-05 MED ORDER — LACOSAMIDE 200 MG PO TABS
200.0000 mg | ORAL_TABLET | Freq: Two times a day (BID) | ORAL | 1 refills | Status: DC
  Filled 2024-08-05: qty 60, 30d supply, fill #0

## 2024-08-05 MED ORDER — LEVETIRACETAM 1000 MG PO TABS
1000.0000 mg | ORAL_TABLET | Freq: Two times a day (BID) | ORAL | 1 refills | Status: DC
  Filled 2024-08-05: qty 60, 30d supply, fill #0

## 2024-08-05 MED ORDER — CLONAZEPAM 1 MG PO TABS
1.0000 mg | ORAL_TABLET | Freq: Every day | ORAL | 1 refills | Status: AC
  Filled 2024-08-05: qty 30, 30d supply, fill #0

## 2024-08-05 MED ORDER — CLONAZEPAM 0.5 MG PO TABS
0.5000 mg | ORAL_TABLET | Freq: Two times a day (BID) | ORAL | 1 refills | Status: DC
  Filled 2024-08-05: qty 60, 30d supply, fill #0

## 2024-08-05 MED ORDER — FYCOMPA 2 MG PO TABS
2.0000 mg | ORAL_TABLET | Freq: Every day | ORAL | 1 refills | Status: DC
  Filled 2024-08-05: qty 30, 30d supply, fill #0

## 2024-08-05 NOTE — Discharge Summary (Signed)
 " Physician Discharge Summary   Patient: Howard Johnson MRN: [id number] DOB: 07-24-1956  Admit date:     07/27/2024  Discharge date: 08/05/2024  Discharge Physician: Elgie Butter   PCP: Shona Norleen PEDLAR, MD   Recommendations at discharge:  Please follow up with PCP in one week.  Please follow up with neurology as recommended and scheduled.   Discharge Diagnoses: Principal Problem:   Aphasia Active Problems:   Chronic kidney disease 3A   Dural venous sinus thrombosis   Intracranial hemorrhage (HCC)   Essential hypertension   DM (diabetes mellitus), type 2 (HCC)   Cerebral venous sinus thrombosis   Antithrombin III  deficiency   Seizure [facility])   Hospital Course: Howard Johnson is a 68 y.o. male with a history of left temporal lobe intracranial bleeding, cerebral venous thrombosis, diabetes mellitus type 2, hypertension, CKD stage IIIa, hyperlipidemia.  Patient presented secondary to aphasia with concern for stroke.  Stroke workup was negative however seizure workup revealed evidence of status epilepticus.  Neurology started patient on antiepileptic drug therapy for management. EEG discontinued.  Assessment and Plan:  Status epilepticus Patient noted to have very frequent seizures while on LTM EEG during workup for aphasia.  Neurology have started him on Keppra , Vimpat  and valproate for management.  -Neurology recommendations: Continue Vimpat , clonazepam  and Keppra , perampanel  and valtoco  nasal spray if persistent seizures.    Aphasia [place] Secondary to uncontrolled seizures. Some improvement with ongoing treatment of seizures.   History of left temporal intracranial bleeding History of cerebral venous thrombosis Noted. Prior to arrival medication(s) include Eliquis . -Continue Eliquis    Diabetes mellitus type 2 Well controlled based on hemoglobin A1C of 5.7%. prior to arrival medication(s) include semaglutide although is possibly transitioned to Lantus . Unlikely that patient needs  insulin  therapy for his current A1C level. -Continue SSI   CKD stage IIIa Baseline creatinine appears to be between 1.5 to 1.7. Creatinine of 1.66 on admission and slightly up to 1.8 and but is stable.   Primary hypertension Prior to arrival medication(s) include lisinopril . -Continue lisinopril    Hyperlipidemia Prior to arrival medication(s) include Crestor . -Continue Crestor      Consultants: neurology.  Procedures performed: LTM EEG  Disposition: Home Diet recommendation:  Cardiac diet DISCHARGE MEDICATION: Allergies as of 08/05/2024       Reactions   Latex Other (See Comments)   Redness Skin irritation Reaction to condom cath   Tape Dermatitis, Other (See Comments)   Skin irritation  Very thin skin, tears easily        Medication List     TAKE these medications    acetaminophen  650 MG CR tablet Commonly known as: TYLENOL  Take 650 mg by mouth 2 (two) times daily.   azelastine 0.1 % nasal spray Commonly known as: ASTELIN Place 2 sprays into both nostrils daily.   clonazePAM  0.5 MG tablet Commonly known as: KLONOPIN  Take 1 tablet (0.5 mg total) by mouth 2 (two) times daily.   clonazePAM  1 MG tablet Commonly known as: KLONOPIN  Take 1 tablet (1 mg total) by mouth at bedtime.   diazePAM  (20 MG Dose) 2 x 10 MG/0.1ML Lqpk Place 20 mg into the nose as needed (seizure lasting over 2 minutes).   Eliquis  5 MG Tabs tablet Generic drug: apixaban  Take 1 tablet (5 mg total) by mouth 2 (two) times daily.   lacosamide  200 MG Tabs tablet Commonly known as: VIMPAT  Take 1 tablet (200 mg total) by mouth 2 (two) times daily.   levETIRAcetam  1000 MG tablet Commonly known  as: KEPPRA  Take 1 tablet (1,000 mg total) by mouth 2 (two) times daily.   lisinopril  5 MG tablet Commonly known as: ZESTRIL  Take 5 mg by mouth every evening.   perampanel  2 MG tablet Commonly known as: FYCOMPA  Take 1 tablet (2 mg total) by mouth at bedtime.   rosuvastatin  10 MG  tablet Commonly known as: CRESTOR  Take 1 tablet (10 mg total) by mouth daily with supper. What changed: when to take this   Rybelsus 7 MG Tabs Generic drug: Semaglutide Take 7 mg by mouth daily before breakfast.        Follow-up Information     [facility]. Schedule an appointment as soon as possible for a visit.   Specialty: Rehabilitation Contact information: [address] [place]  [address] [phone]               Discharge Exam: Howard Johnson   07/27/24 1031  Weight: 97.5 kg   General exam: Appears calm and comfortable  Respiratory system: Clear to auscultation. Respiratory effort normal. Cardiovascular system: S1 & S2 heard, RRR. Gastrointestinal system: Abdomen is soft bs+ Central nervous system: Alert, aphasic.  Extremities: no pedal edema.  Skin: No rashes,    Condition at discharge: fair  The results of significant diagnostics from this hospitalization (including imaging, microbiology, ancillary and laboratory) are listed below for reference.   Imaging Studies: Overnight EEG with video Result Date: 07/28/2024 Shelton Arlin KIDD, MD     07/29/2024 10:29 AM Patient Name: Howard Johnson MRN: [id number] Epilepsy Attending: Arlin KIDD Shelton Referring Physician/Provider: Michaela Aisha SQUIBB, MD Duration: 07/27/2024 1805 to 07/28/2024 1805 Patient history: 68 yo man with hx DVST and SAH in Feb 2025, DM2, HL, HTN who presents with acute onset severe global aphasia. EEG to evaluate for seizure Level of alertness: Awake, asleep AEDs during EEG study: LEV Technical aspects: This EEG study was done with scalp electrodes positioned according to the 10-20 International system of electrode placement. Electrical activity was reviewed with band pass filter of 1-70Hz , sensitivity of 7 uV/mm, display speed of 76mm/sec with a 60Hz  notched filter applied as appropriate. EEG data were recorded continuously and  digitally stored.  Video monitoring was available and reviewed as appropriate. Description: The posterior dominant rhythm consists of 8-9 Hz activity of moderate voltage (25-35 uV) seen predominantly in posterior head regions, asymmetric ( left<right) and reactive to eye opening and eye closing.  Sleep was characterized by vertex waves, sleep spindles (12 to 14 Hz), maximal frontocentral region.  EEG showed continuous 3-5 hz theta- delta slowing in left hemisphere, maximal left temporal region.  Lateralized periodic discharges were noted in left hemisphere, maximal left temporal region at 0.5 to 1 Hz.  Hyperventilation and photic stimulation were not performed.   Seizures without clinical signs were noted during the study arising from the left hemisphere, maximal left temporal region.  During the seizure EEG showed lateralized periodic discharges with overriding fast activity in left hemisphere, maximal left temporal region which evolved in frequency to 2 to 2.5 Hz, and appeared more sharply contoured with overlying rhythmicity.  EEG then evolved into 3 to 5 Hz theta-delta slowing admixed with 13-15hz  beta activity.  At the beginning of the study, 1-2 seizures were noted per hour lasting about a minute each.  Gradually the frequency of seizures worsened and average 9-10 seizures were noted per hour, lasting about 15 to 20 seconds each.  After around midnight, EEG worsened and showed frequent seizures  arising from left hemisphere, maximal left temporal region followed by brief periods of attenuation.  This EEG pattern is consistent with electrographic status epilepticus. Gradually as medications were adjusted, status epilepticus resolved. ABNORMALITY - Lateralized periodic discharge ( LPD ) Left hemisphere, maximal left temporal region - Seizures without clinical signs, left temporal region - Electrographic status epilepticus, left temporal region - Continuous slow, left hemisphere, maximal left temporal region  IMPRESSION: At the beginning of the study, EEG showed evidence of epileptogenicity arising from left hemisphere, maximal left temporal region. Gradually EEG worsened and showed seizures without clinical signs arising from left temporal region, average 9-10 seizures per hour lasting about 15 to 20 seconds each. After around midnight, frequency of seizures worsened and EEG showed electrographic status epilepticus arising from the left temporal region. Gradually as medications were adjusted, status epilepticus resolved. EEG continued to show evidence of cortical dysfunction in left hemisphere, maximal left temporal region, likely secondary to underlying structural abnormality, seizures. Dr. Michaela was notified. Arlin MALVA Krebs   MR BRAIN WO CONTRAST Result Date: 07/27/2024 EXAM: MRI BRAIN WITHOUT CONTRAST 07/27/2024 12:30:49 PM TECHNIQUE: Multiplanar multisequence MRI of the head/brain was performed without the administration of intravenous contrast. COMPARISON: CT head and CTA head and neck 07/27/2024. Prior brain MRI 09/26/2023. CLINICAL HISTORY: 68 year old male with acute neurological deficit, stroke suspected. 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 FINDINGS: BRAIN AND VENTRICLES: No mass. No midline shift. No hydrocephalus. The sella is unremarkable. Major arterial flow voids appear stable, preserved. Chronically abnormal left transverse sinus (series 8 image 8) and left sigmoid sinus (series 8 image 4) vascular flow voids which do not appear significantly changed from the February MRI. Posterior left temporal lobe encephalomalacia corresponding to previous intra axial hematoma site. Chronic hemosiderin there appears regressed on SWI compared to February. No new chronic cerebral blood products. No acute infarct. Stable gray and white matter signal elsewhere including patchy periventricular and other mild scattered nonspecific white matter T2 and FLAIR hyperintensity. Deep gray  nuclei, brainstem, and cerebellum appear negative. Negative visible cervical spine. ORBITS: No acute abnormality. SINUSES AND MASTOIDS: Paranasal sinuses and mastoids are well aerated. BONES AND SOFT TISSUES: Normal marrow signal. No acute soft tissue abnormality. IMPRESSION: 1. No acute intracranial abnormality. 2. Expected evolution left temporal lobe hematoma since February. Stable abnormal left transverse and sigmoid venous sinuses. Electronically signed by: Helayne Hurst MD 07/27/2024 12:50 PM EST RP Workstation: [id number]   CT ANGIO HEAD NECK W WO CM (CODE STROKE) Result Date: 07/27/2024 EXAM: CTA Head and Neck with Intravenous Contrast. CT Head without Contrast. CLINICAL HISTORY: Neuro deficit, acute, stroke suspected. TECHNIQUE: Axial CTA images of the head and neck performed with intravenous contrast. MIP reconstructed images were created and reviewed. Axial computed tomography images of the head/brain performed without intravenous contrast. Note: Per PQRS, the description of internal carotid artery percent stenosis, including 0 percent or normal exam, is based on [place] American Symptomatic Carotid Endarterectomy Trial (NASCET) criteria. Dose reduction technique was used including one or more of the following: automated exposure control, adjustment of mA and kV according to patient size, and/or iterative reconstruction. CONTRAST: Without and with; 75 mL (iohexol  (OMNIPAQUE ) 350 MG/ML injection 75 mL IOHEXOL  350 MG/ML SOLN). COMPARISON: CT head CT venogram and CTA head 09/26/2023. FINDINGS: CTA NECK: COMMON CAROTID ARTERIES: The right carotid artery is patent from the origin to the skull base. There is calcified atherosclerosis at the right carotid bifurcation. The left carotid artery is patent from the origin to  the skull base. Calcified atherosclerosis at the left carotid bifurcation without hemodynamically significant stenosis. No dissection or occlusion. INTERNAL CAROTID ARTERIES: There is  approximately 50% stenosis at the origin of the right cervical ICA. No stenosis by NASCET criteria. No dissection or occlusion. VERTEBRAL ARTERIES: The vertebral arteries are patent from the origins to the vertebrobasilar confluence. There is mild atherosclerosis of the right V1 and V3 segments without significant stenosis. The left vertebral artery origin is slightly obscured due to streak artifact and tortuosity of the vessel at this level. There is likely moderate osseous stenosis of the left vertebral artery. No dissection or occlusion. CTA HEAD: ANTERIOR CEREBRAL ARTERIES: The anterior cerebral arteries are patent bilaterally. No significant stenosis. No occlusion. No aneurysm. MIDDLE CEREBRAL ARTERIES: The middle cerebral arteries are patent bilaterally. No significant stenosis. No occlusion. No aneurysm. POSTERIOR CEREBRAL ARTERIES: The PCAs are patent bilaterally. No significant stenosis. No occlusion. No aneurysm. BASILAR ARTERY: There is mild irregularity of the basilar artery. Mild stenosis of the mid basilar artery. Small AICA noted bilaterally. PICA visualized bilaterally. Superior cerebellar arteries are patent bilaterally. No aneurysm. OTHER: SOFT TISSUES: Subcentimeter bilateral thyroid  nodules. No acute finding. No masses or lymphadenopathy. BONES: Mild degenerative changes in the visualized spine. No acute osseous abnormality. VASCULATURE (Intracranial): The intracranial internal carotid arteries are patent bilaterally. Atherosclerosis of the carotid siphons, right greater than left. No high grade stenosis of the carotid arteries. VENOUS SINUSES: There are filling defects again noted within the mid and distal left transverse sinus and left sigmoid sinus extending to the jugular bulb. This is compatible with thrombus seen on prior studies. The thrombus on the current study appears reorganized and is more chronic appearing, primarily located within the central portions of the venous sinuses. The  thrombus is nonocclusive. There is narrowing and moderate irregularity of the proximal left transverse sinus without significant thrombus in this region. There is 1 possible small focus of nonocclusive thrombus within the proximal left transverse sinus on series 6 image 111. There is moderate narrowing of the left sigmoid sinus. The left jugular bulb appears severely narrowed without occlusion. There are a few small caliber venous collaterals within this region. The internal jugular vein below the skull base is significantly narrowed. There is a focus of severe narrowing of the internal jugular vein as it passes posterior to the styloid process on series 6 image 159. Below this level, there are areas of mixed attenuation likely reflecting mixing of contrast opacified and non opacified blood. AORTIC ARCH/SUBCLAVIAN ARTERIES: There is minimal calcified atherosclerosis along the visualized aortic arch. The left subclavian artery has mixed atherosclerotic plaque along the proximal portion without significant stenosis. IMPRESSION: 1. No acute large vessel occlusion. 2. 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 Moderate narrowing of the left sigmoid sinus. The left jugular bulb is severely narrowed without occlusion, with small venous collaterals noted. 3. The internal jugular vein below the skull base is diminutive in caliber with a focal severe narrowing as it passes posterior to the styloid process. 4. Additional narrowing and moderate irregularity of the proximal left transverse sinus 5. Approximately 50% stenosis at the origin of the right cervical internal carotid artery. 6. Mild irregularity and mild stenosis of the mid basilar artery. 7. Findings discussed with Dr. Matthews at 11:29 AM on 07/27/24. Electronically signed by: Donnice Mania MD 07/27/2024 11:32 AM EST RP Workstation: [id number]   CT HEAD CODE STROKE WO CONTRAST  (  LKW 0-4.5h, LVO 0-24h) Result Date: 07/27/2024 EXAM: CT HEAD WITHOUT CONTRAST 07/27/2024 10:43:37 AM TECHNIQUE: CT of the head was performed without the administration of intravenous contrast. Automated exposure control, iterative reconstruction, and/or weight based adjustment of the mA/kV was utilized to reduce the radiation dose to as low as reasonably achievable. COMPARISON: CT head 09/29/2023, brain MRI 09/26/2023. CLINICAL HISTORY: 68 year old male with acute neurological deficit, stroke suspected. 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 FINDINGS: BRAIN AND VENTRICLES: No acute hemorrhage. No evidence of acute infarct. No hydrocephalus. No extra-axial collection. No mass effect or midline shift. Posterior left temporal lobe encephalomalacia now corresponding to site of intra axial hemorrhage in February. Background brain volume is stable. Stable gray white differentiation. No suspicious intracranial vascular hyperdensity. Calcified atherosclerosis at the skull base. ORBITS: No acute abnormality. SINUSES: Paranasal sinuses, tympanic cavities and mastoids are well aerated. SOFT TISSUES AND SKULL: No acute soft tissue abnormality. No skull fracture. No gaze deviation. alberta stroke program early CT score (aspects) ----- Ganglionic (caudate, ic, lentiform nucleus, insula, M1-m3): 7 Supraganglionic (m4-m6): 3 Total: 10 IMPRESSION: 1. Expected evolution of prior left temporal lobe hemorrhage, now iwth encephalomalacia. No acute intracranial abnormality. ASPECTS 10. 2. These results were communicated to Dr. Matthews at 10:50 on 07/27/2024 by text page via the [facility] messaging system. Electronically signed by: Helayne Hurst MD 07/27/2024 10:52 AM EST RP Workstation: [id number]    Microbiology: Results for orders placed or performed during the hospital encounter of 09/26/23  Resp panel by RT-PCR (RSV, Flu A&B, Covid) Anterior Nasal Swab     Status: None   Collection Time: 09/26/23 11:05 AM    Specimen: Anterior Nasal Swab  Result Value Ref Range Status   SARS Coronavirus 2 by RT PCR NEGATIVE NEGATIVE Final    Comment: (NOTE) SARS-CoV-2 target nucleic acids are NOT DETECTED.  The SARS-CoV-2 RNA is generally detectable in upper respiratory specimens during the acute phase of infection. The lowest concentration of SARS-CoV-2 viral copies this assay can detect is 138 copies/mL. A negative result does not preclude SARS-Cov-2 infection and should not be used as the sole basis for treatment or other patient management decisions. A negative result may occur with  improper specimen collection/handling, submission of specimen other than nasopharyngeal swab, presence of viral mutation(s) within the areas targeted by this assay, and inadequate number of viral copies(<138 copies/mL). A negative result must be combined with clinical observations, patient history, and epidemiological information. The expected result is Negative.  Fact Sheet for Patients:  bloggercourse.com  Fact Sheet for Healthcare Providers:  seriousbroker.it  This test is no t yet approved or cleared by the [place]  FDA and  has been authorized for detection and/or diagnosis of SARS-CoV-2 by FDA under an Emergency Use Authorization (EUA). This EUA will remain  in effect (meaning this test can be used) for the duration of the COVID-19 declaration under Section 564(b)(1) of the Act, 21 U.S.C.section 360bbb-3(b)(1), unless the authorization is terminated  or revoked sooner.       Influenza A by PCR NEGATIVE NEGATIVE Final   Influenza B by PCR NEGATIVE NEGATIVE Final    Comment: (NOTE) The Xpert Xpress SARS-CoV-2/FLU/RSV plus assay is intended as an aid in the diagnosis of influenza from Nasopharyngeal swab specimens and should not be used as a sole basis for treatment. Nasal washings and aspirates are unacceptable for Xpert Xpress  SARS-CoV-2/FLU/RSV testing.  Fact Sheet for Patients: bloggercourse.com  Fact Sheet for Healthcare Providers: seriousbroker.it  This test is  not yet approved or cleared by the [place]  FDA and has been authorized for detection and/or diagnosis of SARS-CoV-2 by FDA under an Emergency Use Authorization (EUA). This EUA will remain in effect (meaning this test can be used) for the duration of the COVID-19 declaration under Section 564(b)(1) of the Act, 21 U.S.C. section 360bbb-3(b)(1), unless the authorization is terminated or revoked.     Resp Syncytial Virus by PCR NEGATIVE NEGATIVE Final    Comment: (NOTE) Fact Sheet for Patients: bloggercourse.com  Fact Sheet for Healthcare Providers: seriousbroker.it  This test is not yet approved or cleared by the [place]  FDA and has been authorized for detection and/or diagnosis of SARS-CoV-2 by FDA under an Emergency Use Authorization (EUA). This EUA will remain in effect (meaning this test can be used) for the duration of the COVID-19 declaration under Section 564(b)(1) of the Act, 21 U.S.C. section 360bbb-3(b)(1), unless the authorization is terminated or revoked.  Performed at [facility], [address], [place], [place] [address]   Blood culture (routine x 2)     Status: None   Collection Time: 09/26/23  1:04 PM   Specimen: BLOOD  Result Value Ref Range Status   Specimen Description BLOOD RIGHT ANTECUBITAL  Final   Special Requests   Final    BOTTLES DRAWN AEROBIC AND ANAEROBIC Blood Culture adequate volume   Culture   Final    NO GROWTH 5 DAYS Performed at [facility], [address] [place], [place] [address]    Report Status 10/01/2023 FINAL  Final  Blood culture (routine x 2)     Status: None   Collection Time: 09/26/23  1:14 PM   Specimen: BLOOD  Result Value Ref Range Status   Specimen Description BLOOD BLOOD  RIGHT ARM  Final   Special Requests   Final    BOTTLES DRAWN AEROBIC AND ANAEROBIC Blood Culture adequate volume   Culture   Final    NO GROWTH 5 DAYS Performed at [facility], [address], [place], [place] [address]    Report Status 10/01/2023 FINAL  Final  MRSA Next Gen by PCR, Nasal     Status: None   Collection Time: 09/26/23  9:25 PM   Specimen: Nasal Mucosa; Nasal Swab  Result Value Ref Range Status   MRSA by PCR Next Gen NOT DETECTED NOT DETECTED Final    Comment: (NOTE) The GeneXpert MRSA Assay (FDA approved for NASAL specimens only), is one component of a comprehensive MRSA colonization surveillance program. It is not intended to diagnose MRSA infection nor to guide or monitor treatment for MRSA infections. Test performance is not FDA approved in patients less than 18 years old. Performed at [facility], [address] [place], [place] [address]     Labs: CBC: Recent Labs  Lab 08/02/24 1414  WBC 10.1  HGB 17.4*  HCT 52.9*  MCV 86.6  PLT 355   Basic Metabolic Panel: Recent Labs  Lab 07/30/24 1134 08/02/24 1414  NA 139 142  K 3.8 3.9  CL 105 105  CO2 23 28  GLUCOSE 133* 107*  BUN 33* 38*  CREATININE 1.79* 1.81*  CALCIUM  9.7 9.4   Liver Function Tests: No results for input(s): AST, ALT, ALKPHOS, BILITOT, PROT, ALBUMIN in the last 168 hours. CBG: Recent Labs  Lab 08/04/24 0608 08/04/24 1113 08/04/24 1652 08/04/24 2157 08/05/24 0634  GLUCAP 113* 79 96 106* 95    Discharge time spent: 35 minutes  Signed: Elgie Butter, MD [facility] 08/05/2024 "

## 2024-08-05 NOTE — TOC Transition Note (Signed)
 Transition of Care [facility]) - Discharge Note   Patient Details  Name: Howard Johnson MRN: [id number] Date of Birth: 08-10-1956  [facility] CM/SW Contact:  Andrez JULIANNA George, RN Phone Number: 08/05/2024, 11:28 AM   Clinical Narrative:     Pt is discharging home with outpatient therapy through [facility]. Information on the AVS.  Family will transport home and provide supervision at home.  Final next level of care: OP Rehab Barriers to Discharge: No Barriers Identified   Patient Goals and CMS Choice   CMS Medicare.gov Compare Post Acute Care list provided to:: Patient Represenative (must comment) Choice offered to / list presented to : Adult Children      Discharge Placement                       Discharge Plan and Services Additional resources added to the After Visit Summary for                                       Social Drivers of Health (SDOH) Interventions SDOH Screenings   Food Insecurity: Patient Unable To Answer (07/29/2024)  Housing: Low Risk (08/03/2024)  Transportation Needs: Patient Unable To Answer (07/29/2024)  Utilities: Patient Unable To Answer (07/29/2024)  Depression (PHQ2-9): Low Risk (10/16/2023)  Social Connections: Socially Integrated (08/03/2024)  Tobacco Use: Low Risk (07/27/2024)     Readmission Risk Interventions     No data to display

## 2024-08-05 NOTE — Plan of Care (Signed)
  Problem: Clinical Measurements: Goal: Ability to maintain clinical measurements within normal limits will improve Outcome: Progressing Goal: Will remain free from infection Outcome: Progressing Goal: Diagnostic test results will improve Outcome: Progressing Goal: Respiratory complications will improve Outcome: Progressing Goal: Cardiovascular complication will be avoided Outcome: Progressing   Problem: Health Behavior/Discharge Planning: Goal: Ability to manage health-related needs will improve Outcome: Progressing   Problem: Activity: Goal: Risk for activity intolerance will decrease Outcome: Progressing   Problem: Nutrition: Goal: Adequate nutrition will be maintained Outcome: Progressing   Problem: Elimination: Goal: Will not experience complications related to bowel motility Outcome: Progressing Goal: Will not experience complications related to urinary retention Outcome: Progressing   Problem: Pain Managment: Goal: General experience of comfort will improve and/or be controlled Outcome: Progressing   Problem: Safety: Goal: Ability to remain free from injury will improve Outcome: Progressing   Problem: Skin Integrity: Goal: Risk for impaired skin integrity will decrease Outcome: Progressing

## 2024-08-07 ENCOUNTER — Telehealth: Payer: Self-pay | Admitting: Neurology

## 2024-08-07 ENCOUNTER — Telehealth: Payer: Self-pay

## 2024-08-07 DIAGNOSIS — I629 Nontraumatic intracranial hemorrhage, unspecified: Secondary | ICD-10-CM

## 2024-08-07 DIAGNOSIS — G08 Intracranial and intraspinal phlebitis and thrombophlebitis: Secondary | ICD-10-CM

## 2024-08-07 DIAGNOSIS — R569 Unspecified convulsions: Secondary | ICD-10-CM

## 2024-08-07 DIAGNOSIS — I619 Nontraumatic intracerebral hemorrhage, unspecified: Secondary | ICD-10-CM

## 2024-08-07 NOTE — Transitions of Care (Post Inpatient/ED Visit) (Signed)
" ° °  08/07/2024  Name: Ajit Errico MRN: [id number] DOB: 1956-05-06  Today's TOC FU Call Status: Today's TOC FU Call Status:: Unsuccessful Call (1st Attempt) Unsuccessful Call (1st Attempt) Date: 08/07/24  Attempted to reach the patient regarding the most recent Inpatient/ED visit. Spoke with son [name] he states that patient is very lethargic with medications he is taking and questions the medication and dosing.   He states he has called the neurology office but they are closed.  Advised that there should be a neurology on-call provider that can advise on dosing.  He will call office back to speak with the on-call provider.   Follow Up Plan: Additional outreach attempts will be made to reach the patient to complete the Transitions of Care (Post Inpatient/ED visit) call.   Kyerra Vargo J. Punam Broussard RN, MSN [facility], [facility] RN Care Manager Direct Dial: [phone]  Fax: [phone] Website: [email or web address]   "

## 2024-08-07 NOTE — Telephone Encounter (Signed)
 Son called the answering service complaining on somnolence since discharges from the hospital. He is on Keppra , Vimpat , Fycompa  and Klonopin . I have advised them to reduce the Klonopin  to only 1 mg in the evening. Discontinue morning dose. They will call to schedule follow up with Dr. Onita   Dr. Gregg

## 2024-08-10 ENCOUNTER — Telehealth: Payer: Self-pay | Admitting: *Deleted

## 2024-08-10 NOTE — Transitions of Care (Post Inpatient/ED Visit) (Signed)
" ° °  08/10/2024  Name: Howard Johnson MRN: [id number] DOB: 05/21/1956  Today's TOC FU Call Status: Today's TOC FU Call Status:: Unsuccessful Call (2nd Attempt) Unsuccessful Call (2nd Attempt) Date: 08/10/24  Attempted to reach the patient regarding the most recent Inpatient/ED visit.  Follow Up Plan: Additional outreach attempts will be made to reach the patient to complete the Transitions of Care (Post Inpatient/ED visit) call.   Andrea Dimes RN, BSN [facility] RN Care Manager [phone]  "

## 2024-08-10 NOTE — Telephone Encounter (Signed)
 Pt son [name] called stating that he is still concern  with Pt medication due  to  Pt is  sleeping all day and is feeling dizzy when he stands the patient son is requesting to speak to Nurse  about Concerns to see what he can do  or different treatment

## 2024-08-11 ENCOUNTER — Encounter (INDEPENDENT_AMBULATORY_CARE_PROVIDER_SITE_OTHER): Admitting: Neurology

## 2024-08-11 ENCOUNTER — Telehealth: Payer: Self-pay

## 2024-08-11 DIAGNOSIS — R569 Unspecified convulsions: Secondary | ICD-10-CM | POA: Diagnosis not present

## 2024-08-11 DIAGNOSIS — I629 Nontraumatic intracranial hemorrhage, unspecified: Secondary | ICD-10-CM

## 2024-08-11 DIAGNOSIS — G08 Intracranial and intraspinal phlebitis and thrombophlebitis: Secondary | ICD-10-CM

## 2024-08-11 NOTE — Telephone Encounter (Signed)
 I called and failed to reach his son, he was admitted to the hospital from December 15 was discharged on August 05, 2024 for status epilepticus  Was discharged with polypharmacy, including Keppra  1000 mg twice a day, Fycompa  2 mg at bedtime, lacosamide  200 mg twice a day, also clonazepam  1 mg twice a day, the dosage of clonazepam  was decreased to 1 every night by Dr. Gregg August 07, 2024  He likely will experience side effect from polypharmacy treatment, including dizziness, sleepiness, fatigue  Please give him a follow-up appointment on August 22, 2023, advised patients to bring all the medication with him.

## 2024-08-11 NOTE — Transitions of Care (Post Inpatient/ED Visit) (Addendum)
 08/11/2024  Patient ID: Howard Johnson, male   DOB: 1956-01-05, 67 y.o.   MRN: [id number]  Spoke with son [name], No Designated Party Release for patient.  [name] voiced concern about patient seizure medications.  Especially scheduled Klonpin.  He feels patient is getting too much medication and he has outreached neurologist but he still feels that he is not being heard about medication concerns.  Advised that CM will message neurology.  He verbalized understanding.    High Priority Message sent to Neurologist Dr. Onita with son's concerns.  Will wait response.    Birdell Frasier J. Alegandra Sommers RN, MSN [facility], [facility] RN Care Manager Direct Dial: [phone]  Fax: [phone] Website: [email or web address]

## 2024-08-11 NOTE — Telephone Encounter (Signed)
 I talked with his son, who is his main caregiver  Patient has dark urine, sleepiness, unsteady gait, has polypharmacy, no recurrent seizure noted  Advised him to decrease Keppra  from 1000 twice a day to 500 mg twice a day, keep the rest of the medications same  Also ordered EEG, hope to be done prior to next follow-up visit on August 22, 2023

## 2024-08-11 NOTE — Telephone Encounter (Signed)
 Spoke to son.  Pt continues with am wakening of dizziness, balance issues.  Using walker.  Klonopin  am dose discontinued per Dr. Gregg on 08-07-2024, taking on klonopin  1mg  po at bedtime. Pt has had no seizures.  On Vimpat  200mg  bid, keppra  1000mg  bid, fycompa  2mg  at bedtime.  Son, [name] questioning if this will get better over time.  Just asking for guidance.  Is this too much medication for him.  I relayed will forward to Dr. Onita for her recommendations.  He has no follow up, at this time.

## 2024-08-11 NOTE — Addendum Note (Signed)
 Addended by: Guillermo Difrancesco on: 08/11/2024 05:21 PM   Modules accepted: Orders

## 2024-08-11 NOTE — Telephone Encounter (Signed)
 I called pts son.  He is real concerned about the pt somnolence. Sleeping a lot. Worried about his medication (too much)?  Sent email.  I will forward to Dr. Onita.  I made appt for him on the 08-21-2024 at 1130. (I said that I did not have any other availability in the afternoon).  He took it but felt like pt would be hard to get to appt due to his somnolence.

## 2024-08-11 NOTE — Telephone Encounter (Signed)
 See phone note dated Aug 11 2024.  Please see the MyChart message reply(ies) for my assessment and plan.    This patient gave consent for this Medical Advice Message and is aware that it may result in a bill to [employer], as well as the possibility of receiving a bill for a co-payment or deductible. They are an established patient, but are not seeking medical advice exclusively about a problem treated during an in person or video visit in the last seven days. I did not recommend an in person or video visit within seven days of my reply.    I spent a total of 15 minutes cumulative time within 7 days through [employer].  Modena Callander, MD

## 2024-08-11 NOTE — Telephone Encounter (Signed)
 Pt Son called informed Pt of MD message . Pt son  stated that day will not work and would like to speak to Nurse

## 2024-08-17 ENCOUNTER — Other Ambulatory Visit (HOSPITAL_COMMUNITY): Payer: Self-pay

## 2024-08-18 ENCOUNTER — Other Ambulatory Visit (HOSPITAL_COMMUNITY): Payer: Self-pay

## 2024-08-21 ENCOUNTER — Telehealth: Payer: Self-pay | Admitting: Neurology

## 2024-08-21 ENCOUNTER — Ambulatory Visit: Admitting: Neurology

## 2024-08-21 VITALS — BP 150/95 | HR 81 | Ht 74.0 in | Wt 206.6 lb

## 2024-08-21 DIAGNOSIS — R569 Unspecified convulsions: Secondary | ICD-10-CM

## 2024-08-21 DIAGNOSIS — I629 Nontraumatic intracranial hemorrhage, unspecified: Secondary | ICD-10-CM | POA: Diagnosis not present

## 2024-08-21 DIAGNOSIS — G08 Intracranial and intraspinal phlebitis and thrombophlebitis: Secondary | ICD-10-CM

## 2024-08-21 MED ORDER — LACOSAMIDE 200 MG PO TABS: 200.0000 mg | ORAL_TABLET | Freq: Two times a day (BID) | ORAL | 11 refills | Status: AC

## 2024-08-21 MED ORDER — FYCOMPA 2 MG PO TABS: 2.0000 mg | ORAL_TABLET | Freq: Every day | ORAL | 5 refills | Status: AC

## 2024-08-21 MED ORDER — CLONAZEPAM 0.5 MG PO TABS: 1.0000 mg | ORAL_TABLET | Freq: Every day | ORAL | 5 refills | Status: DC

## 2024-08-21 MED ORDER — VALTOCO 10 MG DOSE 10 MG/0.1ML NA LIQD: NASAL | 5 refills | Status: DC

## 2024-08-21 NOTE — Telephone Encounter (Signed)
 Please call patient at the end of January 2026, if he is doing well with lower dose of Keppra , no recurrent seizure, will stop Keppra 

## 2024-08-21 NOTE — Progress Notes (Signed)
 "  Chief Complaint  Patient presents with   Follow-up    Pt in room 14. Wife and son in room. Here for hospital follow up on 07/27/24.      ASSESSMENT AND PLAN  Howard Johnson is a 69 y.o. 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, 2024  Presented with sudden onset comprehensive expressive aphasia  Marked abnormal EEG, slow recovering of aphasia Multiple anti-epileptic medications treatment, with significant side effect, he has been doing very well, continue has mild gait abnormality likely due to polypharmacy side effect Repeat EEG,will further taper down Keppra  to 500 mg at nighttime only, if there was no recurrent seizure in 2 weeks, will stop Keppra  Continue Vimpat  200 mg twice a day, clonazepam  1 mg at night, Fycompa  2 mg at night,  Return To Clinic in 6 months  DIAGNOSTIC DATA (LABS, IMAGING, TESTING) - I reviewed patient records, labs, notes, testing and imaging myself where available. Lab in October 2025 A1c 6.1, LDL 58, HDL 39, creatinine 1.71, eGFR 43, hemoglobin of 16.6  MEDICAL HISTORY:  Criss Pallone, is a 69 year old male seen in request by his primary care doctor Shona Rush for evaluation of stroke, he is accompanied by his wife and son at today's visit Dec 12, 2023  History is obtained from the patient and review of electronic medical records. I personally reviewed pertinent available imaging films in PACS.   PMHx of   HTN HLD DMx 12 year  He was admitted to hospital on September 26, 2023 after several days of abnormal speech, confusion, this follow-up few weeks history of COVID, whole family suffered respiratory illness for few weeks  MRI of the brain with contrast showed asymmetric decreased creased the flow void in the left transverse and sigmoid sinus with filling defect in the left sigmoid sinus and proximal internal jugular vein, as well as right sigmoid/transverse sinus junction, also subarachnoid hemorrhage at the left temporal lobe,  CT venogram also confirmed dural venous thrombosis occlusive to near occlusive through the left transverse and sigmoid sinus, extending into the left upper internal jugular vein  Repeat CT head later showed stable left temporal hematoma with some surrounding edema  EEG February 16 showed no epileptiform discharge  Echocardiogram ejection fraction 65 to 70%  Laboratory LDL 54 A1c of 12.8, hypercoagulable panel was negative, creatinine was 1.74 with GFR of 42, normal CBC hemoglobin of 14.5  He was not antithrombotic treatment prior to admission, was discharged with Eliquis  5 mg twice a day  He has made remarkable recovery since discharge, but still with mild mental slowing, 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, 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,  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: He is accompanied by his son at today's visit, overall doing much better, 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, 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, 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, has no problem handling his job, no headache, no seizure-like activity, driving without problem, only occasionally word finding difficulties when he is not sleeping well,   We again reviewed previous MRI of  the brain, in February 2025, evidence of left temporal subarachnoid hemorrhage, left transverse sinus thrombosis  Initial laboratory evaluation showed decreased anti-thrombin 3, but repeat lab test in June showed mild elevation of Antithrombin III , 138, the blood test was  taking 1 he is already taking Eliquis , which can elevate Antithrombin activity, possibly masking deficiency  Literature reviewed, patient with intracranial deep venous thrombosis, needs to be on anticoagulation for 6 to 12 months, both his primary care and I agreed him to take Eliquis  until February 2026,  Will repeat MRI of the brain with without contrast and potentially repeat Antithrombin III  while he is off Eliquis ,  UPDATE Aug 22 2023: He is accompanied by his wife and son at today's visit,  Acute onset onset of aphasia on Dec 15th at work, comprehension and expressive aphasia, seizure activity,  stroke workup was negative, MRI of the brain without contrast showed no acute intracranial abnormality, expected evaluation of left temporal lobe hematoma since February 2025, stable abnormal left transverse and sigmoid venous sinus  Abnormal EEG, lateralized periodic discharge, left hemisphere, maximal in left temporal region, consistent with partial status epilepticus  He was managed by epileptologist Dr. Shelton during his hospital stay, had a persistent postictal aphasia, slowly improving, had prolonged abnormal video EEG monitoring, was treated with titrating dose of antiepileptic medications due to continued abnormal EEG findings, discharged with clonazepam  1.5 mg twice a day, Vimpat  200 mg twice a day, Keppra  1000 mg twice a day,Fycompa  10 mg at bedtime,   Family called office reporting excessive sleepiness, clonazepam  was decreased to 1 mg at bedtime, Keppra  was decreased to 500 mg twice a day He is now taking  Clonazepam  1 mg at bedtime Keppra  500mg  bid since Dec 30th-since then his mood is much better, Vimpat  200mg  bid.  He had a drastic improvement on August 18, 2023, can talk comprehend almost back to his baseline, less sleepiness, moody since Keppra  dose was decreased, mild unsteady gait  PHYSICAL EXAM:   Vitals:   08/21/24 1120  BP: (!) 150/95  Pulse: 81  SpO2: 97%  Weight:  206 lb 9.6 oz (93.7 kg)  Height: 6' 2 (1.88 m)   Orthostatic VS for the past 24 hrs (Last 3 readings):  BP- Lying Pulse- Lying BP- Standing at 0 minutes Pulse- Standing at 0 minutes BP- Standing at 3 minutes Pulse- Standing at 3 minutes  08/21/24 1130 (!) 164/93 76 (!) 165/99 89 (!) 144/95 101    Body mass index is 26.53 kg/m.  PHYSICAL EXAMNIATION:  Gen: NAD, conversant, well nourised, well groomed                     Cardiovascular: Regular rate rhythm, no peripheral edema, warm, nontender. Eyes: Conjunctivae clear without exudates or hemorrhage Neck: Supple, no carotid bruits. Pulmonary: Clear to auscultation bilaterally   NEUROLOGICAL EXAM:  MENTAL STATUS: Speech/cognition: Awake, alert, oriented to history taking and casual conversation  CN II: Visual fields are full to confrontation. Pupils are round equal and briskly reactive to light. CN III, IV, VI: extraocular movement are normal. No ptosis. CN V: Facial sensation is intact to light touch CN VII: Face is symmetric with normal eye closure  CN VIII: Hearing is normal to causal conversation. CN IX, X: Phonation is normal. CN XI: Head turning and shoulder shrug are intact  MOTOR: There is no pronator drift of out-stretched arms. Muscle bulk and tone are normal. Muscle strength is normal.  REFLEXES: Reflexes are 1 and symmetric  at the biceps, triceps, knees, and ankles. Plantar responses are flexor.  SENSORY: Intact to light touch, pinprick and vibratory sensation are intact in fingers and toes.  COORDINATION: There is no trunk or limb dysmetria noted.  GAIT/STANCE: Push-up, cautious,   REVIEW OF SYSTEMS:  Full 14 system review of systems performed and notable only for as above All other review of systems were negative.   ALLERGIES: Allergies  Allergen Reactions   Latex Other (See Comments)    Redness Skin irritation  Reaction to condom cath   Tape Dermatitis and Other (See Comments)    Skin  irritation  Very thin skin, tears easily    HOME MEDICATIONS: Current Outpatient Medications  Medication Sig Dispense Refill   acetaminophen  (TYLENOL ) 650 MG CR tablet Take 650 mg by mouth 2 (two) times daily.     apixaban  (ELIQUIS ) 5 MG TABS tablet Take 1 tablet (5 mg total) by mouth 2 (two) times daily. 60 tablet 0   azelastine (ASTELIN) 0.1 % nasal spray Place 2 sprays into both nostrils daily.     clonazePAM  (KLONOPIN ) 1 MG tablet Take 1 tablet (1 mg total) by mouth at bedtime. 30 tablet 1   diazePAM , 20 MG Dose, 2 x 10 MG/0.1ML LQPK Place 20 mg into the nose as needed (seizure lasting over 2 minutes). 5 each 3   FYCOMPA  2 MG tablet Take 1 tablet (2 mg total) by mouth at bedtime. 30 tablet 1   lacosamide  (VIMPAT ) 200 MG TABS tablet Take 1 tablet (200 mg total) by mouth 2 (two) times daily. 60 tablet 1   levETIRAcetam  (KEPPRA ) 1000 MG tablet Take 1 tablet (1,000 mg total) by mouth 2 (two) times daily. (Patient taking differently: Take 500 mg by mouth 2 (two) times daily.) 60 tablet 1   lisinopril  (ZESTRIL ) 5 MG tablet Take 5 mg by mouth every evening.     rosuvastatin  (CRESTOR ) 10 MG tablet Take 1 tablet (10 mg total) by mouth daily with supper. (Patient taking differently: Take 10 mg by mouth every evening.) 30 tablet 0   Semaglutide (RYBELSUS) 7 MG TABS Take 7 mg by mouth daily before breakfast.     clonazePAM  (KLONOPIN ) 0.5 MG tablet Take 1 tablet (0.5 mg total) by mouth 2 (two) times daily. (Patient not taking: Reported on 08/21/2024) 60 tablet 1   No current facility-administered medications for this visit.    PAST MEDICAL HISTORY: Past Medical History:  Diagnosis Date   Achilles tendinitis    Left   Diabetes mellitus without complication (HCC)    Elevated PSA    Hyperlipidemia    Hypertension    Right knee pain    Shoulder pain, bilateral    Stroke (HCC)     PAST SURGICAL HISTORY: Past Surgical History:  Procedure Laterality Date   TONSILLECTOMY      FAMILY  HISTORY: Family History  Problem Relation Age of Onset   Dementia Mother    Lung cancer Father    Diabetes Sister     SOCIAL HISTORY: Social History   Socioeconomic History   Marital status: Married    Spouse name: margaret   Number of children: 1   Years of education: Not on file   Highest education level: Bachelor's degree (e.g., BA, AB, BS)  Occupational History   Not on file  Tobacco Use   Smoking status: Never    Passive exposure: Never   Smokeless tobacco: Never  Vaping Use   Vaping status: Never Used  Substance and Sexual  Activity   Alcohol use: Never   Drug use: Never   Sexual activity: Yes    Birth control/protection: None  Other Topics Concern   Not on file  Social History Narrative   Son [name] Genera    Social Drivers of Health   Tobacco Use: Low Risk (07/27/2024)   Patient History    Smoking Tobacco Use: Never    Smokeless Tobacco Use: Never    Passive Exposure: Never  Financial Resource Strain: Not on file  Food Insecurity: Patient Unable To Answer (07/29/2024)   Epic    Worried About Programme Researcher, Broadcasting/film/video in the Last Year: Patient unable to answer    Ran Out of Food in the Last Year: Patient unable to answer  Transportation Needs: Patient Unable To Answer (07/29/2024)   Epic    Lack of Transportation (Medical): Patient unable to answer    Lack of Transportation (Non-Medical): Patient unable to answer  Physical Activity: Not on file  Stress: Not on file  Social Connections: Socially Integrated (08/03/2024)   Social Connection and Isolation Panel    Frequency of Communication with Friends and Family: More than three times a week    Frequency of Social Gatherings with Friends and Family: Once a week    Attends Religious Services: More than 4 times per year    Active Member of [employer] or Organizations: Yes    Attends Banker Meetings: More than 4 times per year    Marital Status: Married  Catering Manager Violence: Patient Unable To  Answer (07/29/2024)   Epic    Fear of Current or Ex-Partner: Patient unable to answer    Emotionally Abused: Patient unable to answer    Physically Abused: Patient unable to answer    Sexually Abused: Patient unable to answer  Depression (PHQ2-9): Low Risk (10/16/2023)   Depression (PHQ2-9)    PHQ-2 Score: 0  Alcohol Screen: Not on file  Housing: Low Risk (08/03/2024)   Epic    Unable to Pay for Housing in the Last Year: No    Number of Times Moved in the Last Year: 0    Homeless in the Last Year: No  Utilities: Patient Unable To Answer (07/29/2024)   Epic    Threatened with loss of utilities: Patient unable to answer  Health Literacy: Not on file      Modena Callander, M.D. Ph.D.  [facility] [address] [place], [place] [address] Ph: [phone] Fax: [phone]  CC:  Shona Norleen PEDLAR, MD [address] [place],  [place] [address]  Shona Norleen PEDLAR, MD   "

## 2024-08-24 ENCOUNTER — Emergency Department (HOSPITAL_COMMUNITY)

## 2024-08-24 ENCOUNTER — Inpatient Hospital Stay (HOSPITAL_COMMUNITY)
Admission: EM | Admit: 2024-08-24 | Discharge: 2024-08-28 | DRG: 100 | Disposition: A | Discharge status: Home / Self Care | Attending: Internal Medicine | Admitting: Internal Medicine

## 2024-08-24 DIAGNOSIS — I130 Hypertensive heart and chronic kidney disease with heart failure and stage 1 through stage 4 chronic kidney disease, or unspecified chronic kidney disease: Secondary | ICD-10-CM | POA: Diagnosis present

## 2024-08-24 DIAGNOSIS — R441 Visual hallucinations: Secondary | ICD-10-CM | POA: Diagnosis not present

## 2024-08-24 DIAGNOSIS — H5340 Unspecified visual field defects: Secondary | ICD-10-CM | POA: Diagnosis present

## 2024-08-24 DIAGNOSIS — G40901 Epilepsy, unspecified, not intractable, with status epilepticus: Principal | ICD-10-CM | POA: Diagnosis present

## 2024-08-24 DIAGNOSIS — R410 Disorientation, unspecified: Secondary | ICD-10-CM | POA: Diagnosis not present

## 2024-08-24 DIAGNOSIS — Z743 Need for continuous supervision: Secondary | ICD-10-CM | POA: Diagnosis not present

## 2024-08-24 DIAGNOSIS — E1122 Type 2 diabetes mellitus with diabetic chronic kidney disease: Secondary | ICD-10-CM | POA: Diagnosis present

## 2024-08-24 DIAGNOSIS — E785 Hyperlipidemia, unspecified: Secondary | ICD-10-CM | POA: Diagnosis present

## 2024-08-24 DIAGNOSIS — I480 Paroxysmal atrial fibrillation: Secondary | ICD-10-CM | POA: Diagnosis present

## 2024-08-24 DIAGNOSIS — N1832 Chronic kidney disease, stage 3b: Secondary | ICD-10-CM | POA: Diagnosis present

## 2024-08-24 DIAGNOSIS — R4701 Aphasia: Secondary | ICD-10-CM | POA: Diagnosis present

## 2024-08-24 DIAGNOSIS — Z7901 Long term (current) use of anticoagulants: Secondary | ICD-10-CM | POA: Diagnosis not present

## 2024-08-24 DIAGNOSIS — Z91040 Latex allergy status: Secondary | ICD-10-CM | POA: Diagnosis not present

## 2024-08-24 DIAGNOSIS — G08 Intracranial and intraspinal phlebitis and thrombophlebitis: Secondary | ICD-10-CM | POA: Diagnosis present

## 2024-08-24 DIAGNOSIS — F919 Conduct disorder, unspecified: Secondary | ICD-10-CM | POA: Diagnosis present

## 2024-08-24 DIAGNOSIS — R972 Elevated prostate specific antigen [PSA]: Secondary | ICD-10-CM | POA: Diagnosis present

## 2024-08-24 DIAGNOSIS — R262 Difficulty in walking, not elsewhere classified: Secondary | ICD-10-CM | POA: Diagnosis present

## 2024-08-24 DIAGNOSIS — Z833 Family history of diabetes mellitus: Secondary | ICD-10-CM

## 2024-08-24 DIAGNOSIS — Z8673 Personal history of transient ischemic attack (TIA), and cerebral infarction without residual deficits: Secondary | ICD-10-CM | POA: Diagnosis not present

## 2024-08-24 DIAGNOSIS — Z801 Family history of malignant neoplasm of trachea, bronchus and lung: Secondary | ICD-10-CM | POA: Diagnosis not present

## 2024-08-24 DIAGNOSIS — I629 Nontraumatic intracranial hemorrhage, unspecified: Secondary | ICD-10-CM

## 2024-08-24 DIAGNOSIS — I5032 Chronic diastolic (congestive) heart failure: Secondary | ICD-10-CM | POA: Diagnosis present

## 2024-08-24 DIAGNOSIS — I10 Essential (primary) hypertension: Secondary | ICD-10-CM | POA: Diagnosis present

## 2024-08-24 DIAGNOSIS — Z79899 Other long term (current) drug therapy: Secondary | ICD-10-CM | POA: Diagnosis not present

## 2024-08-24 DIAGNOSIS — E119 Type 2 diabetes mellitus without complications: Secondary | ICD-10-CM

## 2024-08-24 DIAGNOSIS — Z66 Do not resuscitate: Secondary | ICD-10-CM | POA: Diagnosis present

## 2024-08-24 DIAGNOSIS — G40909 Epilepsy, unspecified, not intractable, without status epilepticus: Secondary | ICD-10-CM | POA: Diagnosis not present

## 2024-08-24 DIAGNOSIS — Z91048 Other nonmedicinal substance allergy status: Secondary | ICD-10-CM | POA: Diagnosis not present

## 2024-08-24 DIAGNOSIS — R569 Unspecified convulsions: Secondary | ICD-10-CM

## 2024-08-24 DIAGNOSIS — G9389 Other specified disorders of brain: Secondary | ICD-10-CM | POA: Diagnosis present

## 2024-08-24 LAB — BASIC METABOLIC PANEL WITH GFR
Anion gap: 14 (ref 5–15)
BUN: 29 mg/dL — ABNORMAL HIGH (ref 8–23)
CO2: 23 mmol/L (ref 22–32)
Calcium: 9.7 mg/dL (ref 8.9–10.3)
Chloride: 105 mmol/L (ref 98–111)
Creatinine, Ser: 1.78 mg/dL — ABNORMAL HIGH (ref 0.61–1.24)
GFR, Estimated: 41 mL/min — ABNORMAL LOW
Glucose, Bld: 119 mg/dL — ABNORMAL HIGH (ref 70–99)
Potassium: 4.1 mmol/L (ref 3.5–5.1)
Sodium: 143 mmol/L (ref 135–145)

## 2024-08-24 LAB — CBC
HCT: 50.7 % (ref 39.0–52.0)
Hemoglobin: 16.7 g/dL (ref 13.0–17.0)
MCH: 28.4 pg (ref 26.0–34.0)
MCHC: 32.9 g/dL (ref 30.0–36.0)
MCV: 86.2 fL (ref 80.0–100.0)
Platelets: 316 K/uL (ref 150–400)
RBC: 5.88 MIL/uL — ABNORMAL HIGH (ref 4.22–5.81)
RDW: 13.8 % (ref 11.5–15.5)
WBC: 9.8 K/uL (ref 4.0–10.5)
nRBC: 0 % (ref 0.0–0.2)

## 2024-08-24 MED ORDER — PERAMPANEL 8 MG PO TABS
8.0000 mg | ORAL_TABLET | Freq: Once | ORAL | Status: AC
  Administered 2024-08-24: 8 mg via ORAL
  Filled 2024-08-24: qty 1

## 2024-08-24 MED ORDER — LACOSAMIDE 50 MG PO TABS
200.0000 mg | ORAL_TABLET | Freq: Two times a day (BID) | ORAL | Status: DC
  Administered 2024-08-25 – 2024-08-28 (×7): 200 mg via ORAL
  Filled 2024-08-24 (×3): qty 1
  Filled 2024-08-24: qty 4
  Filled 2024-08-24 (×3): qty 1

## 2024-08-24 MED ORDER — LORAZEPAM 2 MG/ML IJ SOLN
1.0000 mg | Freq: Once | INTRAMUSCULAR | Status: AC
  Administered 2024-08-24: 1 mg via INTRAVENOUS
  Filled 2024-08-24: qty 1

## 2024-08-24 MED ORDER — SODIUM CHLORIDE 0.9 % IV SOLN
200.0000 mg | Freq: Once | INTRAVENOUS | Status: AC
  Administered 2024-08-24: 200 mg via INTRAVENOUS
  Filled 2024-08-24 (×2): qty 20

## 2024-08-24 MED ORDER — LEVETIRACETAM (KEPPRA) 500 MG/5 ML ADULT IV PUSH: 1000.0000 mg | Freq: Once | INTRAVENOUS | Status: DC

## 2024-08-24 MED ORDER — LEVETIRACETAM (KEPPRA) 500 MG/5 ML ADULT IV PUSH
4500.0000 mg | Freq: Once | INTRAVENOUS | Status: AC
  Administered 2024-08-24: 4500 mg via INTRAVENOUS
  Filled 2024-08-24: qty 45

## 2024-08-24 MED ORDER — SODIUM CHLORIDE 0.9 % IV SOLN
100.0000 mg | Freq: Two times a day (BID) | INTRAVENOUS | Status: DC
  Administered 2024-08-25 (×2): 100 mg via INTRAVENOUS
  Filled 2024-08-24 (×4): qty 10

## 2024-08-24 MED ORDER — SODIUM CHLORIDE 0.9 % IV SOLN: 100.0000 mg | Freq: Two times a day (BID) | INTRAVENOUS | Status: DC

## 2024-08-24 NOTE — ED Notes (Signed)
 EEG staff member setting up test bedside

## 2024-08-24 NOTE — ED Provider Notes (Signed)
 Patient transferred from [facility] for concern for status.  Patient had initial blood work CT head showed encephalomalacia.  Patient had Keppra  weaned and was loaded in the ER due to concern for status epilepticus.  On exam patient has expressive aphasia, no generalized seizure activity.  Discussed with neurology plan for stat EEG.  Patient received IV Keppra  at outside hospital 4500 mg.  .Critical Care  Performed by: Tonia Chew, MD Authorized by: Tonia Chew, MD   Critical care provider statement:    Critical care time (minutes):  30   Critical care start time:  08/24/2024 8:00 PM   Critical care end time:  08/24/2024 8:30 PM   Critical care time was exclusive of:  Separately billable procedures and treating other patients and teaching time   Critical care was necessary to treat or prevent imminent or life-threatening deterioration of the following conditions:  CNS failure or compromise  Non-convulsive status epilepticus [facility])  Discussed with hospitalist for admission.   Tonia Chew, MD 08/24/24 2215

## 2024-08-24 NOTE — ED Notes (Addendum)
 Assumed care of pt, found him alert and oriented.  He is struggling to find words for his thoughts, sometimes he is understandable and other times it is a word salad.  Pt is very pleasant and cooperative.  Again at times he appears to struggle to interperate my requests however he does figure it out.    Pt was able to stand bedside and use a urinal w/ stand by assistance.  Changed into a hospital gown but declined taking his pants off.  Pt placed on vital monitoring equipment.  Pt passed swallow screen and was able to take medications.

## 2024-08-24 NOTE — ED Provider Notes (Signed)
 " [place] EMERGENCY DEPARTMENT AT [facility] Provider Note   CSN: [id number] Arrival date & time: 08/24/24  1734     Patient presents with: No chief complaint on file.   Howard Johnson is a 69 y.o. male.   HPI   this patient is a 69 year old male with a history of recently diagnosed seizures t currently on levetiracetam   The patient presents to the hospital today with a complaint of having a possible seizure.  Evidently this patient had been admitted to the hospital with a history of aphasia weakness and chronic kidney disease on December 15 approximately 1 month ago.  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  They had reported that he had aphasia on December 15 while he was at work, there was both comprehensive and expressive aphasia thought to be seizure activity.  He had a negative stroke workup and an MRI of the brain showed no significant abnormalities, EEG was abnormal and showed lateralized periodic discharge in the left hemisphere in the left temporal region consistent with partial status epilepticus.  He had been managed by an epileptologist Dr. Shelton,, discharged on clonazepam  twice a day, Vimpat  twice a day Keppra  twice a day and Fycompa  at bedtime the patient had excessive sleepiness so clonazepam  was decreased to 1 mg at bedtime and Keppra  was decreased to 500 mg twice a day on January 5 the patient was able to comprehend and was almost back to his baseline with less sleepiness  Evidently the patient had recurrent seizure activity today according to paramedics to bring the patient in he had dense difficulty with speaking both with comprehensive and expressive aphasia.  He  was given 20 mg of diazepam  and the paramedics transported him to the hospital with a blood sugar in the normal range.  He continues to have the same symptoms, there is no epileptic convulsive activity seen.  Prior to Admission medications  Medication Sig Start Date End Date Taking? Authorizing Provider  acetaminophen  (TYLENOL ) 650 MG CR tablet Take 650 mg by mouth 2 (two) times daily.    [provider]  apixaban  (ELIQUIS ) 5 MG TABS tablet Take 1 tablet (5 mg total) by mouth 2 (two) times daily. 10/07/23   Love, Sharlet RAMAN, PA-C  azelastine (ASTELIN) 0.1 % nasal spray Place 2 sprays into both nostrils daily.    [provider]  clonazePAM  (KLONOPIN ) 0.5 MG tablet Take 2 tablets (1 mg total) by mouth at bedtime. 08/21/24   Onita Duos, MD  clonazePAM  (KLONOPIN ) 1 MG tablet Take 1 tablet (1 mg total) by mouth at bedtime. 08/05/24   Cherlyn Labella, MD  diazePAM  (VALTOCO  10 MG DOSE) 10 MG/0.1ML LIQD 20mg  nasal spray as needed for seizure 08/21/24   Onita Duos, MD  diazePAM , 20 MG Dose, 2 x 10 MG/0.1ML LQPK Place 20 mg into the nose as needed (seizure lasting over 2 minutes). 08/04/24   Yadav, Priyanka O, MD  FYCOMPA  2 MG tablet Take 1 tablet (2 mg total) by mouth at bedtime. 08/21/24   Onita Duos, MD  lacosamide  (VIMPAT ) 200 MG TABS tablet Take 1 tablet (200 mg total) by mouth 2 (two) times daily. 08/21/24   Onita Duos, MD  levETIRAcetam  (KEPPRA ) 1000 MG tablet Take 1 tablet (1,000 mg total) by mouth 2 (two) times daily. Patient taking differently: Take 500 mg by mouth 2 (two) times daily. 08/05/24   Akula, Vijaya, MD  lisinopril  (ZESTRIL ) 5 MG tablet Take 5 mg by mouth every evening. 03/23/24   [provider]  rosuvastatin  (CRESTOR ) 10 MG tablet Take 1 tablet (10 mg total) by mouth daily with supper. Patient taking differently: Take 10 mg by mouth every evening. 10/07/23   Love, Pamela S, PA-C  Semaglutide (RYBELSUS) 7 MG TABS Take 7 mg by mouth daily before breakfast.    [provider]    Allergies: Latex and Tape    Review of Systems  All other systems reviewed and are negative.   Updated Vital Signs There were no vitals taken for this visit.  Physical Exam Vitals and nursing note reviewed.  Constitutional:      General: He is not in acute distress.    Appearance: He is well-developed.  HENT:     Head: Normocephalic and atraumatic.     Mouth/Throat:     Pharynx: No oropharyngeal exudate.  Eyes:     General: No scleral icterus.       Right eye: No discharge.        Left eye: No discharge.     Conjunctiva/sclera: Conjunctivae normal.     Pupils: Pupils are equal, round, and reactive to light.  Neck:     Thyroid : No thyromegaly.     Vascular: No JVD.  Cardiovascular:     Rate and Rhythm: Normal rate and regular rhythm.     Heart sounds: Normal heart sounds. No murmur heard.    No friction rub. No gallop.  Pulmonary:     Effort: Pulmonary effort is normal. No respiratory distress.     Breath sounds: Normal breath sounds. No wheezing or rales.  Abdominal:     General: Bowel sounds are normal. There is no distension.     Palpations: Abdomen is soft. There is no mass.     Tenderness: There is no abdominal tenderness.  Musculoskeletal:        General: No tenderness. Normal range of motion.     Cervical back: Normal range of motion and neck supple.     Right lower leg: No edema.     Left lower leg: No edema.  Lymphadenopathy:     Cervical: No cervical adenopathy.  Skin:    General: Skin is warm and dry.     Findings: No erythema or rash.  Neurological:     Mental Status: He is alert.     Coordination: Coordination normal.     Comments: The patient moves all 4 extremities with normal coordination, he does not comprehend my request and when I ask him who the president and is he tells me his birthday, when I ask him how many fingers I am holding up he says to but when I ask him the color of my glove he says hand.  The patient will speak  and have 4 or 5 words which appear normal then he will mix it up with several words which do not make any sense, he does not seem to be bothered by this  Psychiatric:        Behavior: Behavior normal.     (all labs ordered are listed, but only abnormal results are displayed) Labs Reviewed  CBC  BASIC METABOLIC PANEL WITH GFR    EKG: None  Radiology:  No results found.   .Critical Care  Performed by: Cleotilde Rogue, MD Authorized by: Cleotilde Rogue, MD   Critical care provider statement:    Critical care time (minutes):  45   Critical care time was exclusive of:  Separately billable procedures and treating other patients and teaching time   Critical care was necessary to treat or prevent imminent or life-threatening deterioration of the following conditions:  CNS failure or compromise   Critical care was time spent personally by me on the following activities:  Development of treatment plan with patient or surrogate, discussions with consultants, evaluation of patient's response to treatment, examination of patient, obtaining history from patient or surrogate, review of old charts, re-evaluation of patient's condition, pulse oximetry, ordering and review of radiographic studies, ordering and review of laboratory studies and ordering and performing treatments and interventions   I assumed direction of critical care for this patient from another provider in my specialty: no     Care discussed with: admitting provider   Comments:          Medications Ordered in the ED  LORazepam  (ATIVAN ) injection 1 mg (has no administration in time range)  levETIRAcetam  (KEPPRA ) undiluted injection 4,500 mg (has no administration in time range)                                    Medical Decision Making Amount and/or Complexity of Data Reviewed Labs: ordered.  Risk Prescription drug management. Decision regarding hospitalization.   Will discuss with neurology, I suspect this is patient  having ongoing seizures related to the left temporal lobe at the site of the prior hemorrhage, Keppra  load  I discussed the case with Dr. Matthews with neurology, she request the patient be transferred to the neurologic center at [facility] for constant EEG monitoring, treat this like status epilepticus albeit nonconvulsive, add Ativan , 60 mg/kg of Keppra   Labs:  I  personally viewed and interpreted the labs which show CBC without leukocytosis or anemia, metabolic panel with a creatinine that is baseline at 1.78 BUN of 29 and a glucose of 119   Radiology Imaging: I personally viewed the images of the ordered radiographic studies and find CT scan of the brain shows no signs of bleeding just some encephalomalacia in the left temporal lobe consistent with prior findings I agree with the radiologist interpretation as well   Patient is critically ill, will transfer ED to ED, will need to be admitted possibly to ICU.  Dr. Darra has accepted ED to ED to go to [facility].     Final diagnoses:  Non-convulsive status epilepticus [facility])    ED Discharge Orders     None          Cleotilde Rogue, MD 08/24/24 2246  "

## 2024-08-24 NOTE — Progress Notes (Signed)
 LTM EEG hooked up and recording with CT compatible leads. [facility] not monitoring yet due to patient in the ED.

## 2024-08-24 NOTE — Plan of Care (Addendum)
 Neurology plan of care  I was contacted by Zelda Salmon EDP Redell Pinal regarding this patient who presented to ED with acute onset persistent aphasia c/f complex partial status epilepticus.  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 He had aphasia at time of that presentation but he significantly improved over subsequent months.  Then 07/27/24 he presented to ED with acute onset global aphasia and was found to be in focal status with impaired awareness. He had persistent symptomatic EEG abnormalities ultimately requiring combination tx with keppra /vimpat /fycompa , scheduled clonazepam . He was managed during this admission by our [facility] inpatient epileptologist Dr. Shelton.  He was discharged from this recent hospitalization on: - Keppra  1000mg  bid - Vimpat  200mg  bid - Fycompa  2mg  at bedtime - Clonazepam  0.5mg  qAM and 1.5mg  at bedtime  He was also continued on his home eliquis .  After hospital discharge family called Dr. Georgianne office to report excessive sleepiness. The following medication changes were made: - clonazepam  decreased to 1mg  at bedtime (changed 12/26) - keppra  decreased to 500mg  bid (changed 12/30, family noticed big improvement in his mood) - vimpat  continued at 200mg  bid  He was doing well when Dr. Onita last saw him 3 days ago. Her plan was to continue keppra  500mg  bid for 2 more weeks then stop it if no recurrent seizures.  He presents to [facility] ED today with acute onset global aphasia (same presentation last month 2/2 partial complex status). Family reports compliance with current medications including taper to 500mg  bid on the keppra . Family administered his home rescue medication (20mg  intranasal midazolam) when aphasia began today but it did not improve his symptoms.   Recommendations: - STAT head CT r/o ICH - Keppra  60mg /kg load now - Continue vimpat  200mg  bid, IV for now. Will order 200mg  IV x1 now to be given prior  to [facility] transfer - Continue fycompa  2mg  at bedtime for now if able to safely take po - If patient alert enough may consider 2mg  IV ativan  - ASAP ED to ED transfer to [facility] for spot EEG to evaluate for recurrent temporal lobe status. Dr. Michaela is on overnight at [facility] and will provide further guidance on mgmt and dispo (ICU vs stepdown) at that time - Page myself (if before 8pm) or overnight [facility] neurohospitalist (if after 8pm) immediately if patient has decline in exam prior to transfer to [facility] ED  D/w Drs. Redell Pinal [facility] EDP and Sal Khaliqdina by phone  Elida Ross, MD [facility] [phone]  If 7pm- 7am, please page neurology on call as listed in AMION.

## 2024-08-25 ENCOUNTER — Other Ambulatory Visit: Admitting: *Deleted

## 2024-08-25 ENCOUNTER — Inpatient Hospital Stay (HOSPITAL_COMMUNITY)

## 2024-08-25 ENCOUNTER — Telehealth: Payer: Self-pay | Admitting: Neurology

## 2024-08-25 DIAGNOSIS — G40909 Epilepsy, unspecified, not intractable, without status epilepticus: Secondary | ICD-10-CM | POA: Diagnosis not present

## 2024-08-25 DIAGNOSIS — I480 Paroxysmal atrial fibrillation: Secondary | ICD-10-CM | POA: Insufficient documentation

## 2024-08-25 DIAGNOSIS — G40901 Epilepsy, unspecified, not intractable, with status epilepticus: Secondary | ICD-10-CM | POA: Diagnosis not present

## 2024-08-25 MED ORDER — ROSUVASTATIN CALCIUM 5 MG PO TABS
10.0000 mg | ORAL_TABLET | Freq: Every day | ORAL | Status: DC
  Administered 2024-08-25 – 2024-08-27 (×3): 10 mg via ORAL
  Filled 2024-08-25 (×3): qty 2

## 2024-08-25 MED ORDER — MELATONIN 5 MG PO TABS
5.0000 mg | ORAL_TABLET | Freq: Every evening | ORAL | Status: DC | PRN
  Administered 2024-08-26 – 2024-08-27 (×2): 5 mg via ORAL
  Filled 2024-08-25 (×2): qty 1

## 2024-08-25 MED ORDER — PROCHLORPERAZINE EDISYLATE 10 MG/2ML IJ SOLN: 5.0000 mg | Freq: Four times a day (QID) | INTRAMUSCULAR | Status: DC | PRN

## 2024-08-25 MED ORDER — PERAMPANEL 2 MG PO TABS
2.0000 mg | ORAL_TABLET | Freq: Every day | ORAL | Status: DC
  Administered 2024-08-25: 2 mg via ORAL
  Filled 2024-08-25: qty 1

## 2024-08-25 MED ORDER — LORAZEPAM 2 MG/ML IJ SOLN: 2.0000 mg | Freq: Four times a day (QID) | INTRAMUSCULAR | Status: DC | PRN

## 2024-08-25 MED ORDER — LACTATED RINGERS IV SOLN: INTRAVENOUS | Status: DC

## 2024-08-25 MED ORDER — ACETAMINOPHEN 500 MG PO TABS: 500.0000 mg | ORAL_TABLET | Freq: Four times a day (QID) | ORAL | Status: DC | PRN

## 2024-08-25 MED ORDER — POLYETHYLENE GLYCOL 3350 17 G PO PACK
17.0000 g | PACK | Freq: Every day | ORAL | Status: DC | PRN
  Administered 2024-08-26: 17 g via ORAL
  Filled 2024-08-25: qty 1

## 2024-08-25 MED ORDER — CLONAZEPAM 1 MG PO TABS
1.0000 mg | ORAL_TABLET | Freq: Every day | ORAL | Status: DC
  Administered 2024-08-25 – 2024-08-27 (×3): 1 mg via ORAL
  Filled 2024-08-25 (×3): qty 1

## 2024-08-25 MED ORDER — APIXABAN 5 MG PO TABS
5.0000 mg | ORAL_TABLET | Freq: Two times a day (BID) | ORAL | Status: DC
  Administered 2024-08-25 – 2024-08-28 (×7): 5 mg via ORAL
  Filled 2024-08-25 (×8): qty 1

## 2024-08-25 NOTE — ED Notes (Signed)
Got patient back on the monitor patient has call bell in reach

## 2024-08-25 NOTE — Telephone Encounter (Signed)
 Have a rough weekend; reaction from the Keppra , confusion, anger issues, Sunday lowered dose of Keppra . Monday; went walking came patient took shower, after speech was slurred, ask patient a question and could respond with correct answer, administered the emergency nasal spray, symptoms got worse, called EMS,. Paramedic ask a question who was the president and patient could understand. Transported to [facility] after examination they decided to transfer patient to [facility]. Neurologist advised to switch patient off of Keppra .

## 2024-08-25 NOTE — Procedures (Addendum)
 Patient Name: Howard Johnson  MRN: [id number]  Epilepsy Attending: Arlin MALVA Krebs  Referring Physician/Provider: Michaela Aisha SQUIBB, MD  Duration: 08/24/2024 2231 to 08/25/2024 1708, 08/26/2024 0751 to 1005  Patient history: 69 y.o. 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 EEG to evaluate for seizure  Level of alertness: Awake, asleep  AEDs during EEG study: Fycompa , LCM, Clonazepam , BRV  Technical aspects: This EEG study was done with scalp electrodes positioned according to the 10-20 International system of electrode placement. Electrical activity was reviewed with band pass filter of 1-70Hz , sensitivity of 7 uV/mm, display speed of 58mm/sec with a 60Hz  notched filter applied as appropriate. EEG data were recorded continuously and digitally stored.  Video monitoring was available and reviewed as appropriate.  Description: The posterior dominant rhythm consists of 9 Hz activity of moderate voltage (25-35 uV) seen predominantly in posterior head regions, symmetric and reactive to eye opening and eye closing. Sleep was characterized by vertex waves, sleep spindles (12 to 14 Hz), maximal frontocentral region. EEG showed continuous 3-5 hz theta- delta slowing in left hemisphere, maximal left temporal region.  Lateralized periodic discharges were noted in left hemisphere, maximal left temporal region at 0.5-1 Hz. Hyperventilation and photic stimulation were not performed.     Patient was moved form ED to floor and EEG was not reconnected. Therefore eeg file is missing between 08/25/2024 1708 to 08/26/2024 0751  ABNORMALITY - Lateralized periodic discharges ( LPD), left hemisphere  - Continuous slow,  left hemisphere   IMPRESSION: This study showed evidence of epileptogenicity and cortical dysfunction in left hemisphere, maximal left temporal region, likely  secondary to underlying structural abnormality, post-ictal state. No seizures were seen throughout the recording.  Ala Kratz O Anara Cowman

## 2024-08-25 NOTE — Telephone Encounter (Signed)
 Pt son called requesting to speak to Nurse . Pt son stated  he really need to speak to nurse about what going Pt is not handling  things right at this time  Pt son stated to call his phone

## 2024-08-25 NOTE — Consult Note (Signed)
 NEUROLOGY CONSULT NOTE   Patient Name: Howard Johnson MRN:  [id number] DOB:  20-Feb-1956 Chief Complaint: aphasia Requesting Provider: Shona Terry SAILOR, DO  History of Present Illness  Howard Johnson is a 69 y.o. 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 He was discharged from this recent hospitalization on - Keppra  1000mg  bid - Vimpat  200mg  bid - Fycompa  2mg  at bedtime - Clonazepam  0.5mg  qAM and 1.5mg  at bedtime  His wife reports that his speech had continued to improve to the point that he was very nearly back to baseline but having significant trouble with his mood.  This had become severely problematic, and therefore his Keppra  was being weaned down and in that setting was normal until he went to shower and subsequently was seen to be severely aphasic.  His wife immediately gave him intranasal Versed and he was taken to [facility] where he was given a load of Keppra  as well as his evening dose of Vimpat .  He continued to be aphasic and therefore has been transferred to [facility] for continuous EEG.   Past History   Past Medical History:  Diagnosis Date   Achilles tendinitis    Left   Diabetes mellitus without complication (HCC)    Elevated PSA    Hyperlipidemia    Hypertension    Right knee pain    Shoulder pain, bilateral    Stroke [facility])     Past Surgical History:  Procedure Laterality Date   TONSILLECTOMY      Family History: Family History  Problem Relation Age of Onset   Dementia Mother    Lung cancer Father    Diabetes Sister     Social History  reports that he has never smoked. He has never been exposed to tobacco smoke. He has never used smokeless tobacco. He reports that he does not drink alcohol and does not use drugs.  Allergies[1]  Medications  Current Medications[2]  Vitals   Vitals:   2024/09/01 1948  2024/09/01 2132 September 01, 2024 2145 08/25/24 0004  BP: (!) 156/96  135/84   Pulse: 86 78 74   Resp: 18 16 16    Temp: 98.1 F (36.7 C)   98.5 F (36.9 C)  TempSrc: Oral   Oral  SpO2: 96% 99% 97%     There is no height or weight on file to calculate BMI.   Physical Exam   Constitutional: Appears well-developed and well-nourished.   Neurologic Examination    Neuro: Mental Status: He is very aphasic, he has difficulty following some commands as well as difficulty with expressing even moderately complex ideas.   Cranial Nerves: II: Visual Fields are full. Pupils are equal, round, and reactive to light.   III,IV, VI: EOMI without ptosis or diploplia.  V: Facial sensation is symmetric to temperature VII: Facial movement with subtle right facial weakness VIII: hearing is intact to voice X: Uvula elevates symmetrically XII: tongue is midline without atrophy or fasciculations.  Motor: He has subtle right arm drift, symmetric in lower extremities Sensory: Sensation is symmetric to light touch  Cerebellar: No clear ataxia on finger-nose-finger        Labs/Imaging/Neurodiagnostic studies   CBC:  Recent Labs  Lab Sep 01, 2024 1819  WBC 9.8  HGB 16.7  HCT 50.7  MCV 86.2  PLT 316   Basic Metabolic Panel:  Lab Results  Component Value Date  NA 143 08/24/2024   K 4.1 08/24/2024   CO2 23 08/24/2024   GLUCOSE 119 (H) 08/24/2024   BUN 29 (H) 08/24/2024   CREATININE 1.78 (H) 08/24/2024   CALCIUM  9.7 08/24/2024   GFRNONAA 41 (L) 08/24/2024   Lipid Panel:  Lab Results  Component Value Date   LDLCALC 54 09/27/2023   HgbA1c:  Lab Results  Component Value Date   HGBA1C 5.7 (H) 07/27/2024   Urine Drug Screen:     Component Value Date/Time   LABOPIA NEGATIVE 07/27/2024 1133   COCAINSCRNUR NEGATIVE 07/27/2024 1133   LABBENZ NEGATIVE 07/27/2024 1133   AMPHETMU NEGATIVE 07/27/2024 1133   THCU NEGATIVE 07/27/2024 1133   LABBARB NEGATIVE 07/27/2024 1133    Alcohol Level      Component Value Date/Time   ETH <15 07/27/2024 1030   INR  Lab Results  Component Value Date   INR 1.2 07/27/2024   APTT  Lab Results  Component Value Date   APTT 30 07/27/2024    CT Head without contrast(Personally reviewed): CT head is negative, other than known left temporal encephalomalacia    ASSESSMENT   [name] is a 69 y.o. 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  His previous admission in December with an identical clinical scenario was due to status epilepticus, and I suspect that this is the same.  He has had behavioral issues with levetiracetam , but especially with him needing direct oral anticoagulants, there is some limitation in our choices of antiepileptics due to interactions.  Brivaracetam  has a lower incidence of behavioral side effects, and it may be worth trying to see if he is able to tolerate this.  I would continue lacosamide  at 200 mg twice daily and clonazepam  at his home dose as well.  RECOMMENDATIONS  Perampanel  8 mg x 1 followed by 2 mg nightly(home dose) Clonazepam  1 mg nightly (home dose) Lacosamide  200 mg twice daily (home dose) Brivaracetam  100 mg twice daily(loaded with keppra ) LTM-EEG __________________________________________________________________    Bonney Aisha Seals, MD [facility]     [1]  Allergies Allergen Reactions   Latex Other (See Comments)    Redness Skin irritation  Reaction to condom cath   Tape Dermatitis and Other (See Comments)    Skin irritation  Very thin skin, tears easily  [2]  Current Facility-Administered Medications:    acetaminophen  (TYLENOL ) tablet 500 mg, 500 mg, Oral, Q6H PRN, Hall, Carole N, DO   apixaban  (ELIQUIS ) tablet 5 mg, 5 mg, Oral, BID, Hall, Carole N, DO   brivaracetam  (BRIVIACT ) 100 mg in sodium chloride  0.9 % 50 mL,  100 mg, Intravenous, Q12H, Tyus Kallam, Aisha SQUIBB, MD   lacosamide  (VIMPAT ) tablet 200 mg, 200 mg, Oral, BID, Seals Aisha SQUIBB, MD   LORazepam  (ATIVAN ) injection 2 mg, 2 mg, Intravenous, Q6H PRN, Hall, Carole N, DO   melatonin tablet 5 mg, 5 mg, Oral, QHS PRN, Hall, Carole N, DO   polyethylene glycol (MIRALAX  / GLYCOLAX ) packet 17 g, 17 g, Oral, Daily PRN, Hall, Carole N, DO   prochlorperazine  (COMPAZINE ) injection 5 mg, 5 mg, Intravenous, Q6H PRN, Shona Terry SAILOR, DO  Current Outpatient Medications:    acetaminophen  (TYLENOL ) 650 MG CR tablet, Take 650 mg by mouth 2 (two) times daily., Disp: , Rfl:    apixaban  (ELIQUIS ) 5 MG TABS tablet, Take 1 tablet (5 mg total) by mouth 2 (two) times  daily., Disp: 60 tablet, Rfl: 0   azelastine (ASTELIN) 0.1 % nasal spray, Place 2 sprays into both nostrils daily., Disp: , Rfl:    clonazePAM  (KLONOPIN ) 1 MG tablet, Take 1 tablet (1 mg total) by mouth at bedtime., Disp: 30 tablet, Rfl: 1   diazePAM , 20 MG Dose, 2 x 10 MG/0.1ML LQPK, Place 20 mg into the nose as needed (seizure lasting over 2 minutes)., Disp: 5 each, Rfl: 3   FYCOMPA  2 MG tablet, Take 1 tablet (2 mg total) by mouth at bedtime., Disp: 30 tablet, Rfl: 5   lacosamide  (VIMPAT ) 200 MG TABS tablet, Take 1 tablet (200 mg total) by mouth 2 (two) times daily., Disp: 60 tablet, Rfl: 11   levETIRAcetam  (KEPPRA ) 1000 MG tablet, Take 1 tablet (1,000 mg total) by mouth 2 (two) times daily. (Patient taking differently: Take 500 mg by mouth at bedtime.), Disp: 60 tablet, Rfl: 1   lisinopril  (ZESTRIL ) 5 MG tablet, Take 5 mg by mouth every evening., Disp: , Rfl:    rosuvastatin  (CRESTOR ) 10 MG tablet, Take 1 tablet (10 mg total) by mouth daily with supper., Disp: 30 tablet, Rfl: 0   Semaglutide (RYBELSUS) 7 MG TABS, Take 7 mg by mouth daily before breakfast. Take one tablet (7mg ) by mouth daily 20 minutes prior to eating breakfast every morning., Disp: , Rfl:    simethicone  (MYLICON) 125 MG chewable tablet,  Chew 125 mg by mouth every 6 (six) hours as needed for flatulence., Disp: , Rfl:

## 2024-08-25 NOTE — Progress Notes (Signed)
 Subjective: No acute events overnight.  Is able to answer some questions but appears to have some expressive aphasia  ROS: Unable to obtain due to aphasia  Examination  Vital signs in last 24 hours: Temp:  [98.1 F (36.7 C)-98.8 F (37.1 C)] 98.1 F (36.7 C) (01/13 1306) Pulse Rate:  [59-88] 80 (01/13 1117) Resp:  [5-21] 18 (01/13 1117) BP: (89-159)/(43-96) 89/43 (01/13 1117) SpO2:  [79 %-100 %] 100 % (01/13 1308)  General: lying in bed, NAD Neuro: MS: Alert, oriented, able to tell me his name, initially told me his address but was eventually able to tell me that he is at a hospital, could not tell me the month, has trouble following commands CN: pupils equal and reactive,  EOMI, face symmetric, tongue midline, normal sensation over face, Motor: Moving all extremities with antigravity strength   Basic Metabolic Panel: Recent Labs  Lab 08/24/24 1819  NA 143  K 4.1  CL 105  CO2 23  GLUCOSE 119*  BUN 29*  CREATININE 1.78*  CALCIUM  9.7    CBC: Recent Labs  Lab 08/24/24 1819  WBC 9.8  HGB 16.7  HCT 50.7  MCV 86.2  PLT 316     Coagulation Studies: No results for input(s): LABPROT, INR in the last 72 hours.  Imaging personally reviewed  CT head without contrast 08/24/2024:No CT evidence for acute intracranial abnormality.  Encephalomalacia within the left temporal lobe at the site of prior remote hemorrhage.    ASSESSMENT AND PLAN:69 y.o. 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   Epilepsy with breakthrough seizure - In the setting of weaning off of Keppra  - Switch Keppra  to Briviact , continue 100 mg twice daily for now - Continue Vimpat  200 mg twice daily, Klonopin  1 mg nightly, perampanel  2 mg nightly - Continue LTM EEG overnight.  Will likely DC tomorrow if no seizures - Speech therapy - Rest of the management per  primary team - Discussed plan with hospitalist via secure chat    I personally spent a total of 36 minutes in the care of the patient today including getting/reviewing separately obtained history, performing a medically appropriate exam/evaluation, counseling and educating, placing orders, referring and communicating with other health care professionals, documenting clinical information in the EHR, independently interpreting results, and coordinating care.           Arlin Krebs Epilepsy [facility] For questions after 5pm please refer to AMION to reach the Neurologist on call

## 2024-08-25 NOTE — H&P (Addendum)
 " History and Physical  Howard Johnson FMW:[id number] DOB: 04-21-1956 DOA: 08/24/2024  Referring physician: Dr. Tonia, EDP  PCP: Shona Norleen PEDLAR, MD  Outpatient Specialists: Neurology. Patient coming from: Home.  Chief Complaint: Transfer ED to ED from [facility] due to concern for status epilepticus.  HPI: Howard Johnson is a 69 y.o. male with medical history significant for hypertension, hyperlipidemia, type 2 diabetes, CKD 3B, paroxysmal A-fib on Eliquis , history of left temporal lobe subarachnoid hemorrhage with surrounding edema, left transverse sinus thrombosis, history of postictal aphasia, mild unsteady gait, history of behavioral issues with Keppra , who presents to the ER due to aphasia with concern for breakthrough seizures.  Associated with dense difficulty with speaking both with comprehensive and expressive aphasia.  EMS was activated and the patient was given 20 mg of diazepam  en route.  He was brought in to [facility] ER for further evaluation.  In the ER, no epileptic convulsive activity seen, however he was severely aphasic, with concern for subclinical status epilepticus.  He received a load of IV Keppra  and his evening dose of Vimpat .  [facility]red from [facility], ED to [facility] ED for neurology evaluation and LTM EEG.  Seen by neurology, started on brivaracetam , since it has a lower incidence of behavioral side effects.  Additionally, home Vimpat  and Klonopin  were continued.  TRH, hospitalist service, was asked to admit.  ED Course: Temperature 98.5.  BP 117/78, pulse 74, respiration rate 16, O2 saturation 99% on room air.  Review of Systems: Review of systems as noted in the HPI. All other systems reviewed and are negative.   Past Medical History:  Diagnosis Date   Achilles tendinitis    Left   Diabetes mellitus without complication (HCC)    Elevated PSA    Hyperlipidemia    Hypertension    Right knee pain    Shoulder pain, bilateral    Stroke [facility])     Past Surgical History:  Procedure Laterality Date   TONSILLECTOMY      Social History:  reports that he has never smoked. He has never been exposed to tobacco smoke. He has never used smokeless tobacco. He reports that he does not drink alcohol and does not use drugs.   Allergies[1]  Family History  Problem Relation Age of Onset   Dementia Mother    Lung cancer Father    Diabetes Sister       Prior to Admission medications  Medication Sig Start Date End Date Taking? Authorizing Provider  acetaminophen  (TYLENOL ) 650 MG CR tablet Take 650 mg by mouth 2 (two) times daily.   Yes [provider]  apixaban  (ELIQUIS ) 5 MG TABS tablet Take 1 tablet (5 mg total) by mouth 2 (two) times daily. 10/07/23  Yes Love, Sharlet RAMAN, PA-C  azelastine (ASTELIN) 0.1 % nasal spray Place 2 sprays into both nostrils daily.   Yes [provider]  clonazePAM  (KLONOPIN ) 1 MG tablet Take 1 tablet (1 mg total) by mouth at bedtime. 08/05/24  Yes Akula, Vijaya, MD  diazePAM , 20 MG Dose, 2 x 10 MG/0.1ML LQPK Place 20 mg into the nose as needed (seizure lasting over 2 minutes). 08/04/24  Yes Yadav, Priyanka O, MD  FYCOMPA  2 MG tablet Take 1 tablet (2 mg total) by mouth at bedtime. 08/21/24  Yes Onita Duos, MD  lacosamide  (VIMPAT ) 200 MG TABS tablet Take 1 tablet (200 mg total) by mouth 2 (two) times daily. 08/21/24  Yes Onita Duos, MD  levETIRAcetam  (  KEPPRA ) 1000 MG tablet Take 1 tablet (1,000 mg total) by mouth 2 (two) times daily. Patient taking differently: Take 500 mg by mouth at bedtime. 08/05/24  Yes Akula, Vijaya, MD  lisinopril  (ZESTRIL ) 5 MG tablet Take 5 mg by mouth every evening. 03/23/24  Yes [provider]  rosuvastatin  (CRESTOR ) 10 MG tablet Take 1 tablet (10 mg total) by mouth daily with supper. 10/07/23  Yes Love, Sharlet RAMAN, PA-C  Semaglutide (RYBELSUS) 7 MG TABS Take 7 mg by mouth daily before breakfast. Take one tablet (7mg ) by mouth daily 20 minutes prior to eating breakfast  every morning.   Yes [provider]  simethicone  (MYLICON) 125 MG chewable tablet Chew 125 mg by mouth every 6 (six) hours as needed for flatulence.   Yes [provider]    Physical Exam: BP 106/65   Pulse 73   Temp 98.5 F (36.9 C) (Oral)   Resp (!) 21   SpO2 91%   General: 69 y.o. year-old male well developed well nourished in no acute distress.  Alert with abnormal speech, aphasic, both receptive and expressive. Cardiovascular: Regular rate and rhythm with no rubs or gallops.  No thyromegaly or JVD noted.  No lower extremity edema. 2/4 pulses in all 4 extremities. Respiratory: Clear to auscultation with no wheezes or rales. Good inspiratory effort. Abdomen: Soft nontender nondistended with normal bowel sounds x4 quadrants. Muskuloskeletal: No cyanosis, clubbing or edema noted bilaterally Neuro: CN II-XII intact, strength, sensation, reflexes Skin: No ulcerative lesions noted or rashes Psychiatry: Judgement and insight appear altered. Mood is appropriate for condition and setting          Labs on Admission:  Basic Metabolic Panel: Recent Labs  Lab 08/24/24 1819  NA 143  K 4.1  CL 105  CO2 23  GLUCOSE 119*  BUN 29*  CREATININE 1.78*  CALCIUM  9.7   Liver Function Tests: No results for input(s): AST, ALT, ALKPHOS, BILITOT, PROT, ALBUMIN in the last 168 hours. No results for input(s): LIPASE, AMYLASE in the last 168 hours. No results for input(s): AMMONIA in the last 168 hours. CBC: Recent Labs  Lab 08/24/24 1819  WBC 9.8  HGB 16.7  HCT 50.7  MCV 86.2  PLT 316   Cardiac Enzymes: No results for input(s): CKTOTAL, CKMB, CKMBINDEX, TROPONINI in the last 168 hours.  BNP (last 3 results) No results for input(s): BNP in the last 8760 hours.  ProBNP (last 3 results) No results for input(s): PROBNP in the last 8760 hours.  CBG: No results for input(s): GLUCAP in the last 168 hours.  Radiological Exams on  Admission: CT HEAD WO CONTRAST ( ) Result Date: 08/24/2024 CLINICAL DATA:  Acute onset global aphasia EXAM: CT HEAD WITHOUT CONTRAST TECHNIQUE: Contiguous axial images were obtained from the base of the skull through the vertex without intravenous contrast. RADIATION DOSE REDUCTION: This exam was performed according to the departmental dose-optimization program which includes automated exposure control, adjustment of the mA and/or kV according to patient size and/or use of iterative reconstruction technique. COMPARISON:  MRI and head CT 07/27/2024, head CT 09/29/2023 FINDINGS: Brain: No acute territorial infarction, hemorrhage or intracranial mass. Encephalomalacia within the left temporal lobe corresponding to previously noted hemorrhage. No ventricular enlargement. Vascular: No hyperdense vessels.  Carotid vascular calcification Skull: Normal. Negative for fracture or focal lesion. Sinuses/Orbits: No acute finding. Other: None IMPRESSION: 1. No CT evidence for acute intracranial abnormality. 2. Encephalomalacia within the left temporal lobe at the site of prior remote hemorrhage. Electronically  Signed   By: Luke Bun M.D.   On: 08/24/2024 19:51    EKG: I independently viewed the EKG done and my findings are as followed: Sinus rhythm rate of 92.  QTc 432.  Assessment/Plan Present on Admission:  Non-convulsive status epilepticus (HCC)  Principal Problem:   Non-convulsive status epilepticus (HCC)  Nonconvulsive status epilepticus, POA History of postictal aphasia Neurology following LTM EEG Brivaracetam , Klonopin , Vimpat , Fycompa  as recommended by neurology Seizure precautions Fall precautions IV Ativan  PRN for breakthrough seizures Close monitoring  Paroxysmal A-fib on Eliquis  History of left transverse sinus thrombosis Resume home Eliquis  Continue fall precautions  Behavioral issues with Keppra  Home Keppra  held. Consider one-to-one sitter if the patient develops agitation and  confusion Currently cooperative, per nursing staff.  Hypertension BPs are currently soft Hold off home lisinopril  Continue to monitor vital signs  Hyperlipidemia Resume home Crestor   Prediabetes Hemoglobin A1c 5.7 on 07/27/2024 Diet controlled  CKD 3B Renal function appears to be at baseline Creatinine 1.78 with GFR 41. Avoid nephrotoxic agents, dehydration, hypertension LR at 50 cc/h x 1 day. Monitor urine output  Chronic HFpEF Euvolemic on exam Last 2D echo done on 09/27/2023 revealed LVEF 65 to 70% with grade 1 diastolic dysfunction Monitor volume status while on gentle IV fluid hydration LR at 50 cc/h x 1 day Strict I's and O's and daily weight.  Ambulatory dysfunction Mild unsteady gait at baseline PT OT evaluation Fall precautions   Critical care time: 55-minutes   DVT prophylaxis: Home Eliquis  twice daily.  Code Status: DNR/DNI.  Family Communication: None at bedside.  Disposition Plan: Admitted to progressive care unit.  Consults called: Neurology.  Admission status: Inpatient status.   Status is: Inpatient The patient requires at least 2 midnights for further evaluation and treatment of present condition.   Terry LOISE Hurst MD [facility] Pager [phone]  If 7PM-7AM, please contact night-coverage [email or web address] Password [email or web address]  08/25/2024, 3:02 AM      [1]  Allergies Allergen Reactions   Latex Other (See Comments)    Redness Skin irritation  Reaction to condom cath   Tape Dermatitis and Other (See Comments)    Skin irritation  Very thin skin, tears easily   "

## 2024-08-25 NOTE — Hospital Course (Signed)
 Howard Johnson is a 69 y.o. male with PMH left temporal lobe SAH with surrounding edema, left transverse sinus thrombosis, history of seizure disorder with postictal aphasia, unsteady gait, HTN, HLD, DM II, CKD 3B, PAF on Eliquis . He presented with aphasia with concern for breakthrough seizure.  He was brought to the hospital via EMS and given Valium  in route.  He remained severely aphasic in the ER with concern for subclinical status.  He was loaded with Keppra  and home regimen otherwise continued.  Neurology consulted and LTM EEG placed.   Assessment/Plan  Nonconvulsive status epilepticus, POA History of postictal aphasia Neurology following LTM EEG -Started on brivaracetam  in place of Keppra  per neurology to help with history of agitation/behavioral issues with Keppra . However, prior to discharge, pharmacy assisted with PA and med returned at ~$430/mth. Discussed with neurology further and we will instead d/c briviact  and increase perampanel . He received dose on 1/14 @ 2012  of increased perampanel  and then nursing began reporting hallucinations around 8:40pm (unclear if they started after dose given the time frame, e.g. not long enough for med effect) - discussed further with neurology on 1/15; we will reduce perampanel  back to 2 mg and add on depakote  at this time.  I discussed with son and wife bedside also this morning and they are aware of the plan and agreeable - continue Klonopin  and Vimpat  - again monitor overnight and will anticipate d/c tomorrow if doing okay then    PAF History of left transverse sinus thrombosis - continue Eliquis  Continue fall precautions   Behavioral issues with Keppra  Home Keppra  held - see above changes    Hypertension - BP uptrending - resume lisinopril     Hyperlipidemia Resume home Crestor    Prediabetes Hemoglobin A1c 5.7 on 07/27/2024 Diet controlled   CKD 3B Renal function appears to be at baseline Creatinine 1.78 with GFR 41 - s/p  IVF on admission in setting of seizure  - continue diet    Chronic HFpEF Euvolemic on exam Last 2D echo done on 09/27/2023 revealed LVEF 65 to 70% with grade 1 diastolic dysfunction Monitor volume status while on gentle IV fluid hydration LR at 50 cc/h x 1 day Strict I's and O's and daily weight.   Ambulatory dysfunction Mild unsteady gait at baseline PT OT evaluation Fall precautions

## 2024-08-25 NOTE — ED Notes (Signed)
 When pt sleeping, pt 's O2 dropped into the 70s.  Placed on 2L [place] and O2 returned to WNL.

## 2024-08-25 NOTE — Progress Notes (Signed)
 LTM maint complete - no skin breakdown under: F3,P3,A2. Pt still in ED not monitored at this time.

## 2024-08-25 NOTE — Telephone Encounter (Signed)
 Called and spoke to pt son who stated that he is in [place] for sz

## 2024-08-25 NOTE — Progress Notes (Signed)
 " Progress Note    Howard Johnson   FMW:[id number]  DOB: [birth year]  DOA: 08/24/2024     1 PCP: Shona Norleen PEDLAR, MD  Initial CC: Seizure breakthrough  Hospital Course: Howard Johnson is a 69 y.o. male with PMH left temporal lobe SAH with surrounding edema, left transverse sinus thrombosis, history of seizure disorder with postictal aphasia, unsteady gait, HTN, HLD, DM II, CKD 3B, PAF on Eliquis . He presented with aphasia with concern for breakthrough seizure.  He was brought to the hospital via EMS and given Valium  in route.  He remained severely aphasic in the ER with concern for subclinical status.  He was loaded with Keppra  and home regimen otherwise continued.  Neurology consulted and LTM EEG placed.   Assessment/Plan  Nonconvulsive status epilepticus, POA History of postictal aphasia Neurology following LTM EEG -Started on brivaracetam  in place of Keppra  per neurology to help with history of agitation/behavioral issues with Keppra  -Continue Klonopin , Vimpat , Fycompa    PAF History of left transverse sinus thrombosis - continue Eliquis  Continue fall precautions   Behavioral issues with Keppra  Home Keppra  held. Consider one-to-one sitter if the patient develops agitation and confusion - see above for keppra  change to brivaracetam     Hypertension BPs are currently soft Hold off home lisinopril  Continue to monitor vital signs   Hyperlipidemia Resume home Crestor    Prediabetes Hemoglobin A1c 5.7 on 07/27/2024 Diet controlled   CKD 3B Renal function appears to be at baseline Creatinine 1.78 with GFR 41 - s/p IVF on admission in setting of seizure  - continue diet    Chronic HFpEF Euvolemic on exam Last 2D echo done on 09/27/2023 revealed LVEF 65 to 70% with grade 1 diastolic dysfunction Monitor volume status while on gentle IV fluid hydration LR at 50 cc/h x 1 day Strict I's and O's and daily weight.   Ambulatory dysfunction Mild unsteady gait at  baseline PT OT evaluation Fall precautions  Interval History:  No events overnight.  Continues to have receptive aphasia this morning.  Speech is fluent and clear but he is unable to answer questions asked or follow commands when asked.  He can mimic cues however.   Antimicrobials: N/a  Consultants:  Neurology  Procedures:    DVT prophylaxis:   apixaban  (ELIQUIS ) tablet 5 mg   Code Status:   Code Status: Limited: Do not attempt resuscitation (DNR) -DNR-LIMITED -Do Not Intubate/DNI   Barriers to discharge: none Therapy evaluation: PT Orders: Active   PT Follow up Rec:   Disposition Plan:  TBD Status is: Inpt  Mobility Assessment (Last 72 Hours)     Mobility Assessment   No documentation.     Diet: Diet Orders (From admission, onward)     Start     Ordered   08/25/24 0333  Diet heart healthy/carb modified Room service appropriate? Yes; Fluid consistency: Thin  Diet effective now       Question Answer Comment  Diet-HS Snack? Nothing   Room service appropriate? Yes   Fluid consistency: Thin      08/25/24 0332            Objective: Blood pressure (!) 89/43, pulse 80, temperature 98.8 F (37.1 C), temperature source Oral, resp. rate 18, SpO2 98%.  Examination:  Physical Exam Constitutional:      Comments: Resting in bed in no distress with obvious receptive aphasia  HENT:     Head: Normocephalic and atraumatic.     Mouth/Throat:     Mouth: Mucous  membranes are moist.  Eyes:     Extraocular Movements: Extraocular movements intact.  Cardiovascular:     Rate and Rhythm: Normal rate and regular rhythm.  Pulmonary:     Effort: Pulmonary effort is normal.     Breath sounds: Normal breath sounds.  Abdominal:     General: Bowel sounds are normal. There is no distension.     Palpations: Abdomen is soft.     Tenderness: There is no abdominal tenderness.  Musculoskeletal:        General: Normal range of motion.     Cervical back: Normal range of motion  and neck supple.  Skin:    General: Skin is warm and dry.  Neurological:     Comments: Receptive aphasia but moving all 4 extremities and able to squeeze fingers and move legs by mimicking commands      Data Reviewed: Results for orders placed or performed during the hospital encounter of 08/24/24 (from the past 24 hours)  CBC     Status: Abnormal   Collection Time: 08/24/24  6:19 PM  Result Value Ref Range   WBC 9.8 4.0 - 10.5 K/uL   RBC 5.88 (H) 4.22 - 5.81 MIL/uL   Hemoglobin 16.7 13.0 - 17.0 g/dL   HCT 49.2 60.9 - 47.9 %   MCV 86.2 80.0 - 100.0 fL   MCH 28.4 26.0 - 34.0 pg   MCHC 32.9 30.0 - 36.0 g/dL   RDW 86.1 88.4 - 84.4 %   Platelets 316 150 - 400 K/uL   nRBC 0.0 0.0 - 0.2 %  Basic metabolic panel     Status: Abnormal   Collection Time: 08/24/24  6:19 PM  Result Value Ref Range   Sodium 143 135 - 145 mmol/L   Potassium 4.1 3.5 - 5.1 mmol/L   Chloride 105 98 - 111 mmol/L   CO2 23 22 - 32 mmol/L   Glucose, Bld 119 (H) 70 - 99 mg/dL   BUN 29 (H) 8 - 23 mg/dL   Creatinine, Ser 8.21 (H) 0.61 - 1.24 mg/dL   Calcium  9.7 8.9 - 10.3 mg/dL   GFR, Estimated 41 (L) >60 mL/min   Anion gap 14 5 - 15    I have reviewed pertinent nursing notes, vitals, labs, and images as necessary. I have ordered labwork to follow up on as indicated.  I have reviewed the last notes from staff over past 24 hours. I have discussed patient's care plan and test results with nursing staff, CM/SW, and other staff as appropriate.  Old records reviewed in assessment of this patient  Time spent: Greater than 50% of the 55 minute visit was spent in counseling/coordination of care for the patient as laid out in the A&P.   LOS: 1 day   Alm Apo, MD [facility] 08/25/2024, 12:57 PM "

## 2024-08-26 ENCOUNTER — Inpatient Hospital Stay (HOSPITAL_COMMUNITY)

## 2024-08-26 ENCOUNTER — Other Ambulatory Visit (HOSPITAL_COMMUNITY): Payer: Self-pay

## 2024-08-26 DIAGNOSIS — G40901 Epilepsy, unspecified, not intractable, with status epilepticus: Secondary | ICD-10-CM | POA: Diagnosis not present

## 2024-08-26 DIAGNOSIS — G40909 Epilepsy, unspecified, not intractable, without status epilepticus: Secondary | ICD-10-CM | POA: Diagnosis not present

## 2024-08-26 MED ORDER — PERAMPANEL 2 MG PO TABS
6.0000 mg | ORAL_TABLET | Freq: Every day | ORAL | Status: DC
  Administered 2024-08-26: 6 mg via ORAL
  Filled 2024-08-26: qty 3

## 2024-08-26 MED ORDER — PERAMPANEL 6 MG PO TABS
6.0000 mg | ORAL_TABLET | Freq: Every day | ORAL | 0 refills | Status: DC
  Filled 2024-08-26: qty 30, 30d supply, fill #0

## 2024-08-26 MED ORDER — BRIVARACETAM 100 MG PO TABS
100.0000 mg | ORAL_TABLET | Freq: Two times a day (BID) | ORAL | 3 refills | Status: DC
  Filled 2024-08-26: qty 60, 30d supply, fill #0

## 2024-08-26 MED ORDER — PERAMPANEL 6 MG PO TABS: 6.0000 mg | ORAL_TABLET | Freq: Every day | ORAL | 5 refills | Status: DC

## 2024-08-26 MED ORDER — BRIVARACETAM 100 MG PO TABS
100.0000 mg | ORAL_TABLET | Freq: Two times a day (BID) | ORAL | Status: DC
  Administered 2024-08-26: 100 mg via ORAL
  Filled 2024-08-26: qty 1

## 2024-08-26 NOTE — Plan of Care (Signed)
  Problem: Health Behavior/Discharge Planning: Goal: Ability to manage health-related needs will improve Outcome: Progressing   Problem: Clinical Measurements: Goal: Will remain free from infection Outcome: Progressing   Problem: Coping: Goal: Level of anxiety will decrease Outcome: Progressing   Problem: Safety: Goal: Ability to remain free from injury will improve Outcome: Progressing   
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
## 2024-08-26 NOTE — Progress Notes (Addendum)
 Subjective: No acute events overnight.  No new concerns.  Aphasia improving.  ROS: negative except above Examination  Vital signs in last 24 hours: Temp:  [98.1 F (36.7 C)-98.9 F (37.2 C)] 98.3 F (36.8 C) (01/14 0801) Pulse Rate:  [77-96] 84 (01/14 0801) Resp:  [11-18] 18 (01/14 0801) BP: (89-164)/(43-100) 147/85 (01/14 0801) SpO2:  [96 %-100 %] 98 % (01/14 0801) Weight:  [93.9 kg] 93.9 kg (01/14 0625)  General: Sitting in bed, not in apparent distress Neuro: MS: Alert, oriented, follows commands but at times requires repetition CN: pupils equal and reactive,  EOMI, face symmetric, tongue midline, normal sensation over face, some right visual field defect but difficult to assess due to aphasia Motor: 5/5 strength in all 4 extremities Coordination: normal Gait: not tested  Basic Metabolic Panel: Recent Labs  Lab 08/24/24 1819  NA 143  K 4.1  CL 105  CO2 23  GLUCOSE 119*  BUN 29*  CREATININE 1.78*  CALCIUM  9.7    CBC: Recent Labs  Lab 08/24/24 1819  WBC 9.8  HGB 16.7  HCT 50.7  MCV 86.2  PLT 316     Coagulation Studies: No results for input(s): LABPROT, INR in the last 72 hours.  Imaging No new imaging overnight    ASSESSMENT AND PLAN:69 y.o. 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    Epilepsy with breakthrough seizure - In the setting of weaning off of Keppra  - Continue Briviact  100 mg twice daily, Vimpat  200 mg twice daily, Klonopin  1 mg nightly, perampanel  2 mg nightly - DC LTM EEG - Speech therapy - Rest of the management per primary team - Discussed plan with hospitalist via secure chat -Continue to follow-up with [facility]  ADDENDUM - Briviact  copay 430/month. Family unable to afford.Therefore will try to increase Perampanel  to 6mg  at bedtime instead - Please come to ER if any  more sz or aphasia  Seizure precautions: Per [place]  DMV statutes, patients with seizures are not allowed to drive until they have been seizure-free for six months and cleared by a physician    Use caution when using heavy equipment or power tools. Avoid working on ladders or at heights. Take showers instead of baths. Ensure the water temperature is not too high on the home water heater. Do not go swimming alone. Do not lock yourself in a room alone (i.e. bathroom). When caring for infants or small children, sit down when holding, feeding, or changing them to minimize risk of injury to the child in the event you have a seizure. Maintain good sleep hygiene. Avoid alcohol.    If patient has another seizure, call 911 and bring them back to the ED if: A.  The seizure lasts longer than 5 minutes.      B.  The patient doesn't wake shortly after the seizure or has new problems such as difficulty seeing, speaking or moving following the seizure C.  The patient was injured during the seizure D.  The patient has a temperature over 102 F (39C) E.  The patient vomited during the seizure and now is having trouble breathing    During the Seizure   - First, ensure adequate ventilation and place patients on the floor on their left side  Loosen clothing around the neck and ensure the airway is patent. If the patient is clenching the teeth, do not force  the mouth open with any object as this can cause severe damage - Remove all items from the surrounding that can be hazardous. The patient may be oblivious to what's happening and may not even know what he or she is doing. If the patient is confused and wandering, either gently guide him/her away and block access to outside areas - Reassure the individual and be comforting - Call 911. In most cases, the seizure ends before EMS arrives. However, there are cases when seizures may last over 3 to 5 minutes. Or the individual may have developed breathing  difficulties or severe injuries. If a pregnant patient or a person with diabetes develops a seizure, it is prudent to call an ambulance.     After the Seizure (Postictal Stage)   After a seizure, most patients experience confusion, fatigue, muscle pain and/or a headache. Thus, one should permit the individual to sleep. For the next few days, reassurance is essential. Being calm and helping reorient the person is also of importance.   Most seizures are painless and end spontaneously. Seizures are not harmful to others but can lead to complications such as stress on the lungs, brain and the heart. Individuals with prior lung problems may develop labored breathing and respiratory distress.           I personally spent a total of 37 minutes in the care of the patient today including getting/reviewing separately obtained history, performing a medically appropriate exam/evaluation, counseling and educating, placing orders, referring and communicating with other health care professionals, documenting clinical information in the EHR, independently interpreting results, and coordinating care.        Arlin Krebs Epilepsy [facility] For questions after 5pm please refer to AMION to reach the Neurologist on call

## 2024-08-26 NOTE — Telephone Encounter (Signed)
 Called son, [name]. LMVM for him returning call from yesterday.  [phone].  Pt admitted to hospital.

## 2024-08-26 NOTE — Progress Notes (Signed)
 Patient's son called back requesting to start anti-depressant him starting tomorrow morning

## 2024-08-26 NOTE — Progress Notes (Addendum)
 " Progress Note    Howard Johnson   FMW:[id number]  DOB: 1955-10-19  DOA: 08/24/2024     2 PCP: Shona Norleen PEDLAR, MD  Initial CC: Seizure breakthrough  Hospital Course: Howard Johnson is a 69 y.o. male with PMH left temporal lobe SAH with surrounding edema, left transverse sinus thrombosis, history of seizure disorder with postictal aphasia, unsteady gait, HTN, HLD, DM II, CKD 3B, PAF on Eliquis . He presented with aphasia with concern for breakthrough seizure.  He was brought to the hospital via EMS and given Valium  in route.  He remained severely aphasic in the ER with concern for subclinical status.  He was loaded with Keppra  and home regimen otherwise continued.  Neurology consulted and LTM EEG placed.   Assessment/Plan  Nonconvulsive status epilepticus, POA History of postictal aphasia Neurology following LTM EEG -Started on brivaracetam  in place of Keppra  per neurology to help with history of agitation/behavioral issues with Keppra . However, prior to discharge, pharmacy assisted with PA and med returned at ~$430/mth. Discussed with neurology further and we will instead d/c briviact  and increase perampanel  - continue Klonopin  and Vimpat  - Monitor in hospital 1 more night and have sent prescription to their retail pharmacy which also has to be ordered   PAF History of left transverse sinus thrombosis - continue Eliquis  Continue fall precautions   Behavioral issues with Keppra  Home Keppra  held - see above changes    Hypertension BPs are currently soft Hold off home lisinopril  Continue to monitor vital signs   Hyperlipidemia Resume home Crestor    Prediabetes Hemoglobin A1c 5.7 on 07/27/2024 Diet controlled   CKD 3B Renal function appears to be at baseline Creatinine 1.78 with GFR 41 - s/p IVF on admission in setting of seizure  - continue diet    Chronic HFpEF Euvolemic on exam Last 2D echo done on 09/27/2023 revealed LVEF 65 to 70% with grade 1 diastolic  dysfunction Monitor volume status while on gentle IV fluid hydration LR at 50 cc/h x 1 day Strict I's and O's and daily weight.   Ambulatory dysfunction Mild unsteady gait at baseline PT OT evaluation Fall precautions  Interval History:  No events overnight.   Aphasia much better today. Wife and son present bedside this afternoon. Some prescription issues especially with cost.  Regimen further modified today and we will monitor 1 more night while outpatient pharmacy has to order prescription anyways. Aiming for discharge tomorrow.   Antimicrobials: N/a  Consultants:  Neurology  Procedures:    DVT prophylaxis:   apixaban  (ELIQUIS ) tablet 5 mg   Code Status:   Code Status: Limited: Do not attempt resuscitation (DNR) -DNR-LIMITED -Do Not Intubate/DNI   Barriers to discharge: none Therapy evaluation: PT Orders: Active   PT Follow up Rec: Home Health Pt1/14/2026 1500  Disposition Plan:  TBD Status is: Inpt  Mobility Assessment (Last 72 Hours)     Mobility Assessment     Row Name 08/26/24 1500 08/26/24 1400 08/26/24 1000 08/26/24 0739 08/25/24 2000   Does the patient have exclusion criteria? -- No- Perform mobility assessment -- No- Perform mobility assessment No- Perform mobility assessment   What is the highest level of mobility based on the mobility assessment? Level 4 (Ambulates with assistance) - Balance while stepping forward/back - Complete Level 4 (Ambulates with assistance) - Balance while stepping forward/back - Complete Level 4 (Ambulates with assistance) - Balance while stepping forward/back - Complete Level 4 (Ambulates with assistance) - Balance while stepping forward/back - Complete Level 4 (  Ambulates with assistance) - Balance while stepping forward/back - Complete   Is the above level different from baseline mobility prior to current illness? -- Yes - Recommend PT order -- -- Yes - Recommend PT order    Row Name 08/25/24 1735           Does the  patient have exclusion criteria? No- Perform mobility assessment       What is the highest level of mobility based on the mobility assessment? Level 3 (Stands with assistance) - Balance while standing  and cannot march in place       Is the above level different from baseline mobility prior to current illness? Yes - Recommend PT order          Diet: Diet Orders (From admission, onward)     Start     Ordered   08/26/24 0000  Diet - low sodium heart healthy        08/26/24 1258   08/25/24 0333  Diet heart healthy/carb modified Room service appropriate? Yes; Fluid consistency: Thin  Diet effective now       Question Answer Comment  Diet-HS Snack? Nothing   Room service appropriate? Yes   Fluid consistency: Thin      08/25/24 0332            Objective: Blood pressure 136/84, pulse 94, temperature 98.2 F (36.8 C), temperature source Oral, resp. rate 16, weight 93.9 kg, SpO2 97%.  Examination:  Physical Exam Constitutional:      Comments: Resting in bed in no distress with much improved aphasia  HENT:     Head: Normocephalic and atraumatic.     Mouth/Throat:     Mouth: Mucous membranes are moist.  Eyes:     Extraocular Movements: Extraocular movements intact.  Cardiovascular:     Rate and Rhythm: Normal rate and regular rhythm.  Pulmonary:     Effort: Pulmonary effort is normal.     Breath sounds: Normal breath sounds.  Abdominal:     General: Bowel sounds are normal. There is no distension.     Palpations: Abdomen is soft.     Tenderness: There is no abdominal tenderness.  Musculoskeletal:        General: Normal range of motion.     Cervical back: Normal range of motion and neck supple.  Skin:    General: Skin is warm and dry.  Neurological:     Comments: Follows commands easily this morning and aphasia has improved      Data Reviewed: No results found for this or any previous visit (from the past 24 hours).   I have reviewed pertinent nursing notes, vitals,  labs, and images as necessary. I have ordered labwork to follow up on as indicated.  I have reviewed the last notes from staff over past 24 hours. I have discussed patient's care plan and test results with nursing staff, CM/SW, and other staff as appropriate.  Old records reviewed in assessment of this patient  Time spent: Greater than 50% of the 55 minute visit was spent in counseling/coordination of care for the patient as laid out in the A&P.   LOS: 2 days   Alm Apo, MD [facility] 08/26/2024, 4:22 PM "

## 2024-08-26 NOTE — Progress Notes (Signed)
 I called patient son Ojas Coone ) and informed him about patient's hallucinations . According to his son, patient experienced the same thing when he was admitted to the hospital a month ago.

## 2024-08-26 NOTE — Progress Notes (Signed)
 LTM EEG discontinued - no skin breakdown at [facility].

## 2024-08-26 NOTE — Evaluation (Signed)
 Physical Therapy Evaluation Patient Details Name: Howard Johnson MRN: [id number] DOB: [birth year] Today's Date: 08/26/2024  History of Present Illness  Pt is a 69 y.o male presented to [facility] ED 1/12 for possible seizure, and global aphasia. Transferred to [facility] for EEG. EEG showed epileptogenicity and cortical dysfunction in L temporal region. Recent admission 12/17 for evolution left temporal lobe hematoma since February. PMHx: L temporal ICH, cerebral venous thrombosis, T2DM, HTN, CKD III, HLD, seizures  Clinical Impression  Pt presents with admitting diagnosis above. Pt today was able to ambulate in hallway with no AD CGA/Min A. Able to perform DGI scoring 13/24 indicating that he remains a very high fall risk. PTA pt family reports that he has been very lethargic at home due to medication however was walking with son around the house and recently able to take a shower on his own. Pt was supposed to start OPPT however due to medication related lethargy has been unable to make appointments per family. Family requesting HHPT upon DC until pt medication regiment can be adjusted in order for him to participate in OPPT. Recommend HHPT upon DC. PT will continue to follow.         If plan is discharge home, recommend the following: A little help with walking and/or transfers;A little help with bathing/dressing/bathroom;Assistance with cooking/housework;Assist for transportation;Help with stairs or ramp for entrance;Supervision due to cognitive status;Direct supervision/assist for medications management   Can travel by private vehicle        Equipment Recommendations None recommended by PT  Recommendations for Other Services       Functional Status Assessment Patient has had a recent decline in their functional status and demonstrates the ability to make significant improvements in function in a reasonable and predictable amount of time.     Precautions / Restrictions Precautions Precautions:  Fall Recall of Precautions/Restrictions: Impaired Precaution/Restrictions Comments: aphasia Restrictions Weight Bearing Restrictions Per Provider Order: No      Mobility  Bed Mobility Overal bed mobility: Modified Independent             General bed mobility comments: Increased time, no assist. Attempting to get out of bed with bed rail still up.    Transfers Overall transfer level: Needs assistance Equipment used: None Transfers: Sit to/from Stand Sit to Stand: Supervision           General transfer comment: Supervision for safety.    Ambulation/Gait Ambulation/Gait assistance: Contact guard assist, Min assist Gait Distance (Feet): 350 Feet Assistive device: None Gait Pattern/deviations: Step-through pattern, Decreased stride length, Decreased step length - right, Decreased step length - left, Trunk flexed, Narrow base of support, Drifts right/left Gait velocity: reduced Gait velocity interpretation: 1.31 - 2.62 ft/sec, indicative of limited community ambulator   General Gait Details: no LOB noted however noted to drift L/R with head turns and required increased cues to avoid obstacles. Able to perform DGI.  Stairs Stairs: Yes Stairs assistance: Contact guard assist Stair Management: Two rails, Alternating pattern, Forwards Number of Stairs: 2 General stair comments: Part of DGI  Wheelchair Mobility     Tilt Bed    Modified Rankin (Stroke Patients Only)       Balance Overall balance assessment: Needs assistance Sitting-balance support: No upper extremity supported, Feet supported Sitting balance-Leahy Scale: Good Sitting balance - Comments: Pt sat EOB with close supervision   Standing balance support: No upper extremity supported, During functional activity Standing balance-Leahy Scale: Fair Standing balance comment: CGA  Standardized Balance Assessment Standardized Balance Assessment : Dynamic Gait Index   Dynamic Gait  Index Level Surface: Mild Impairment Change in Gait Speed: Mild Impairment Gait with Horizontal Head Turns: Moderate Impairment Gait with Vertical Head Turns: Moderate Impairment Gait and Pivot Turn: Mild Impairment Step Over Obstacle: Mild Impairment Step Around Obstacles: Moderate Impairment Steps: Mild Impairment Total Score: 13       Pertinent Vitals/Pain Pain Assessment Pain Assessment: No/denies pain    Home Living Family/patient expects to be discharged to:: Private residence Living Arrangements: Children;Spouse/significant other Available Help at Discharge: Family Type of Home: House Home Access: Stairs to enter Entrance Stairs-Rails: Doctor, General Practice of Steps: 2   Home Layout: One level Home Equipment: Agricultural Consultant (2 wheels);Shower seat;BSC/3in1;Transport chair      Prior Function Prior Level of Function : Needs assist             Mobility Comments: Pt reports since CVA has been walking with son, no AD, active with OP PT ADLs Comments: Pt mod I with ADLs. Recently was able to shower by himself.     Extremity/Trunk Assessment   Upper Extremity Assessment Upper Extremity Assessment: Defer to OT evaluation    Lower Extremity Assessment Lower Extremity Assessment: Overall WFL for tasks assessed    Cervical / Trunk Assessment Cervical / Trunk Assessment: Normal  Communication   Communication Communication: Impaired Factors Affecting Communication: Difficulty expressing self    Cognition Arousal: Alert Behavior During Therapy: Impulsive, WFL for tasks assessed/performed   PT - Cognitive impairments: Difficult to assess, No family/caregiver present to determine baseline, Initiation, Sequencing, Problem solving, Safety/Judgement Difficult to assess due to: Impaired communication                     PT - Cognition Comments: Pt initially received impulsively getting out of bed with bed rail still up. Still slightly  aphasic. Following commands: Impaired Following commands impaired: Follows one step commands with increased time, Follows one step commands inconsistently     Cueing Cueing Techniques: Verbal cues, Tactile cues     General Comments General comments (skin integrity, edema, etc.): VSS on RA    Exercises     Assessment/Plan    PT Assessment Patient needs continued PT services  PT Problem List Decreased balance;Decreased mobility;Decreased safety awareness       PT Treatment Interventions DME instruction;Gait training;Stair training;Functional mobility training;Therapeutic activities;Therapeutic exercise;Balance training;Cognitive remediation;Patient/family education    PT Goals (Current goals can be found in the Care Plan section)  Acute Rehab PT Goals Patient Stated Goal: Return Home PT Goal Formulation: With patient Time For Goal Achievement: 09/09/24 Potential to Achieve Goals: Good    Frequency Min 2X/week     Co-evaluation               AM-PAC PT 6 Clicks Mobility  Outcome Measure Help needed turning from your back to your side while in a flat bed without using bedrails?: A Little Help needed moving from lying on your back to sitting on the side of a flat bed without using bedrails?: A Little Help needed moving to and from a bed to a chair (including a wheelchair)?: A Little Help needed standing up from a chair using your arms (e.g., wheelchair or bedside chair)?: A Little Help needed to walk in hospital room?: A Little Help needed climbing 3-5 steps with a railing? : A Lot 6 Click Score: 17    End of Session Equipment Utilized During Treatment: Gait belt  Activity Tolerance: Patient tolerated treatment well Patient left: with call bell/phone within reach;in chair;with chair alarm set Nurse Communication: Mobility status PT Visit Diagnosis: Difficulty in walking, not elsewhere classified (R26.2);Other abnormalities of gait and mobility (R26.89);Unsteadiness  on feet (R26.81);Muscle weakness (generalized) (M62.81)    Time: 8654-8592 PT Time Calculation (min) (ACUTE ONLY): 22 min   Charges:   PT Evaluation $PT Eval Moderate Complexity: 1 Mod   PT General Charges $$ ACUTE PT VISIT: 1 Visit         Beauregard Jarrells B, PT, DPT Acute Rehab Services [phone]   Anne Sebring 08/26/2024, 3:56 PM

## 2024-08-26 NOTE — TOC Initial Note (Signed)
 Transition of Care [facility]) - Initial/Assessment Note    Patient Details  Name: Howard Johnson MRN: [id number] Date of Birth: [birth year]  [facility] CM/SW Contact:    Andrez JULIANNA George, RN Phone Number: 08/26/2024, 2:15 PM  Clinical Narrative:                 Howard Johnson is a 69 y.o. male with medical history significant for hypertension, hyperlipidemia, type 2 diabetes, CKD 3B, paroxysmal A-fib on Eliquis , history of left temporal lobe subarachnoid hemorrhage with surrounding edema, left transverse sinus thrombosis, history of postictal aphasia, mild unsteady gait, history of behavioral issues with Keppra , who presents to the ER due to aphasia with concern for breakthrough seizures.  Pt is from home with his spouse and son. Someone is with him all the time.  Pt has needed DME at home.  Family provides needed transportation.   Current recommendations are for outpatient rehab. Pt and family feel he doesn't need this currently and if needed after dc they will contact pts Neurology office.  IP Care management following.  Expected Discharge Plan: Home/Self Care Barriers to Discharge: Continued Medical Work up   Patient Goals and CMS Choice   CMS Medicare.gov Compare Post Acute Care list provided to:: Patient Represenative (must comment) Choice offered to / list presented to : Adult Children, Spouse      Expected Discharge Plan and Services   Discharge Planning Services: CM Consult   Living arrangements for the past 2 months: Single Family Home Expected Discharge Date: 08/26/24                                    Prior Living Arrangements/Services Living arrangements for the past 2 months: Single Family Home Lives with:: Adult Children, Spouse Patient language and need for interpreter reviewed:: Yes Do you feel safe going back to the place where you live?: Yes        Care giver support system in place?: Yes (comment) Current home services: DME  (wheelchair/ walker/ cane) Criminal Activity/Legal Involvement Pertinent to Current Situation/Hospitalization: No - Comment as needed  Activities of Daily Living      Permission Sought/Granted                  Emotional Assessment Appearance:: Appears stated age Attitude/Demeanor/Rapport: Engaged Affect (typically observed): Accepting Orientation: : Oriented to Self, Oriented to Place, Oriented to  Time, Oriented to Situation   Psych Involvement: No (comment)  Admission diagnosis:  Non-convulsive status epilepticus (HCC) [G40.901] Patient Active Problem List   Diagnosis Date Noted   PAF (paroxysmal atrial fibrillation) (HCC) 08/25/2024   Non-convulsive status epilepticus (HCC) 08/24/2024   Seizure (HCC) 07/31/2024   Aphasia 07/27/2024   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 [place] [address] Phone: [phone] Fax: [phone]     Social Drivers of Health (SDOH) Social History:  SDOH Screenings   Food Insecurity: Patient Unable To Answer (07/29/2024)  Housing: Low Risk (08/03/2024)  Transportation Needs: Patient Unable To Answer (07/29/2024)  Utilities: Patient Unable To Answer (07/29/2024)  Depression (PHQ2-9): Low Risk (10/16/2023)  Social Connections: Socially Integrated (08/03/2024)  Tobacco Use: Low Risk (07/27/2024)   SDOH Interventions:     Readmission Risk Interventions     No  data to display

## 2024-08-26 NOTE — Progress Notes (Signed)
 vLTM maintenance  All impedance below 10k  No skin breakdown noted at all skin sites

## 2024-08-26 NOTE — Evaluation (Signed)
 Occupational Therapy Evaluation Patient Details Name: Howard Johnson MRN: [id number] DOB: 03-28-1956 Today's Date: 08/26/2024   History of Present Illness   Pt is a 69 y.o male presented to [facility] ED 1/12 for possible seizure, and global aphasia. Transferred to [facility] for EEG. EEG showed epileptogenicity and cortical dysfunction in L temporal region. Recent admission 12/17 for evolution left temporal lobe hematoma since February. PMHx: L temporal ICH, cerebral venous thrombosis, T2DM, HTN, CKD III, HLD, seizures     Clinical Impressions Pt admitted based on above, and was seen based on problem list below. Pt reports since recent admission has been mod I for ADLs and supervision for mobility with no AD. Today pt is requiring set up  to CGA for ADLs. Functional transfers are  CGA for balance with no AD. Limited to in room activity d/t EEG leads. Noted decrease bil temporal visual fields, educated pt on importance of visual scanning with mobility to increase safety. Recommending pt continue OP OT services to address above listed deficits. OT will continue to follow acutely to maximize functional independence.     If plan is discharge home, recommend the following:   Direct supervision/assist for medications management;Direct supervision/assist for financial management;Assist for transportation;Supervision due to cognitive status     Functional Status Assessment   Patient has had a recent decline in their functional status and demonstrates the ability to make significant improvements in function in a reasonable and predictable amount of time.     Equipment Recommendations   None recommended by OT      Precautions/Restrictions   Precautions Precautions: Fall Recall of Precautions/Restrictions: Impaired Precaution/Restrictions Comments: aphasia Restrictions Weight Bearing Restrictions Per Provider Order: No     Mobility Bed Mobility Overal bed mobility: Modified Independent      General bed mobility comments: Increased time, no assist    Transfers Overall transfer level: Needs assistance Equipment used: None Transfers: Sit to/from Stand Sit to Stand: Contact guard assist           General transfer comment: CGA for balance. Cues to avoid obstacles d/t decreased temporal visual fields      Balance Overall balance assessment: Needs assistance Sitting-balance support: No upper extremity supported, Feet supported Sitting balance-Leahy Scale: Good     Standing balance support: No upper extremity supported, During functional activity Standing balance-Leahy Scale: Fair Standing balance comment: CGA       ADL either performed or assessed with clinical judgement   ADL Overall ADL's : Needs assistance/impaired Eating/Feeding: Set up;Sitting   Grooming: Set up;Sitting   Upper Body Bathing: Set up;Sitting   Lower Body Bathing: Contact guard assist;Sit to/from stand   Upper Body Dressing : Set up;Sitting   Lower Body Dressing: Contact guard assist;Sit to/from stand   Toilet Transfer: Contact guard assist;Ambulation   Toileting- Clothing Manipulation and Hygiene: Contact guard assist;Sit to/from stand       Functional mobility during ADLs: Contact guard assist General ADL Comments: CGA for balance and assistance for navigating environments     Vision Baseline Vision/History: 1 Wears glasses Ability to See in Adequate Light: 0 Adequate Patient Visual Report: Peripheral vision impairment Vision Assessment?: Yes Eye Alignment: Within Functional Limits Ocular Range of Motion: Within Functional Limits Alignment/Gaze Preference: Within Defined Limits Tracking/Visual Pursuits: Able to track stimulus in all quads without difficulty Saccades: Within functional limits Convergence: Within functional limits Visual Fields: Right visual field deficit;Left visual field deficit Additional Comments: Pt with decreased bil temporal visual fields, educated pt  on scanning  techniques for safety with mobility            Pertinent Vitals/Pain Pain Assessment Pain Assessment: No/denies pain     Extremity/Trunk Assessment Upper Extremity Assessment Upper Extremity Assessment: Right hand dominant;Overall [facility] for tasks assessed   Lower Extremity Assessment Lower Extremity Assessment: Defer to PT evaluation   Cervical / Trunk Assessment Cervical / Trunk Assessment: Normal   Communication Communication Communication: Impaired Factors Affecting Communication: Difficulty expressing self   Cognition Arousal: Alert Behavior During Therapy: WFL for tasks assessed/performed, Impulsive Cognition: Difficult to assess Difficult to assess due to: Impaired communication     OT - Cognition Comments: Pt with expressive and receptive aphasia, appears WFL. Decreased insight into deficits.       Following commands: Impaired Following commands impaired: Follows one step commands with increased time, Follows one step commands inconsistently     Cueing  General Comments   Cueing Techniques: Verbal cues;Tactile cues  VSS on RA           Home Living Family/patient expects to be discharged to:: Private residence Living Arrangements: Children;Spouse/significant other Available Help at Discharge: Family Type of Home: House Home Access: Stairs to enter Secretary/administrator of Steps: 2   Home Layout: One level     Bathroom Shower/Tub: Producer, Television/film/video: Standard Bathroom Accessibility: Yes How Accessible: Accessible via walker Home Equipment: None          Prior Functioning/Environment Prior Level of Function : Patient poor historian/Family not available;Independent/Modified Independent             Mobility Comments: Pt reports since CVA has been walking with son, no AD, active with OP PT ADLs Comments: Pt mod I with ADLs    OT Problem List: Impaired vision/perception;Impaired balance (sitting and/or  standing);Decreased cognition;Decreased safety awareness;Decreased knowledge of use of DME or AE   OT Treatment/Interventions: Self-care/ADL training;Therapeutic exercise;Energy conservation;DME and/or AE instruction;Therapeutic activities;Patient/family education;Balance training      OT Goals(Current goals can be found in the care plan section)   Acute Rehab OT Goals Patient Stated Goal: To get OOB OT Goal Formulation: With patient Time For Goal Achievement: 09/09/24 Potential to Achieve Goals: Good   OT Frequency:  Min 2X/week       AM-PAC OT 6 Clicks Daily Activity     Outcome Measure Help from another person eating meals?: None Help from another person taking care of personal grooming?: A Little Help from another person toileting, which includes using toliet, bedpan, or urinal?: A Little Help from another person bathing (including washing, rinsing, drying)?: A Little Help from another person to put on and taking off regular upper body clothing?: A Little Help from another person to put on and taking off regular lower body clothing?: A Little 6 Click Score: 19   End of Session Equipment Utilized During Treatment: Gait belt Nurse Communication: Mobility status  Activity Tolerance: Patient tolerated treatment well Patient left: in bed;with call bell/phone within reach;with bed alarm set  OT Visit Diagnosis: Unsteadiness on feet (R26.81);Other abnormalities of gait and mobility (R26.89)                Time: 9066-9045 OT Time Calculation (min): 21 min Charges:  OT General Charges $OT Visit: 1 Visit OT Evaluation $OT Eval Moderate Complexity: 1 Mod  Adrianne BROCKS, OT  Acute Rehabilitation Services Office [phone] Secure chat preferred   Adrianne GORMAN Savers 08/26/2024, 10:20 AM

## 2024-08-27 ENCOUNTER — Inpatient Hospital Stay (HOSPITAL_COMMUNITY)

## 2024-08-27 DIAGNOSIS — G40909 Epilepsy, unspecified, not intractable, without status epilepticus: Secondary | ICD-10-CM | POA: Diagnosis not present

## 2024-08-27 DIAGNOSIS — R441 Visual hallucinations: Secondary | ICD-10-CM | POA: Diagnosis not present

## 2024-08-27 DIAGNOSIS — G40901 Epilepsy, unspecified, not intractable, with status epilepticus: Secondary | ICD-10-CM | POA: Diagnosis not present

## 2024-08-27 MED ORDER — PERAMPANEL 2 MG PO TABS
2.0000 mg | ORAL_TABLET | Freq: Every day | ORAL | Status: DC
  Administered 2024-08-27: 2 mg via ORAL
  Filled 2024-08-27: qty 1

## 2024-08-27 MED ORDER — DIVALPROEX SODIUM 250 MG PO DR TAB
500.0000 mg | DELAYED_RELEASE_TABLET | Freq: Two times a day (BID) | ORAL | Status: DC
  Administered 2024-08-27 – 2024-08-28 (×3): 500 mg via ORAL
  Filled 2024-08-27 (×3): qty 2

## 2024-08-27 MED ORDER — OXCARBAZEPINE 150 MG PO TABS: 150.0000 mg | ORAL_TABLET | Freq: Two times a day (BID) | ORAL | Status: DC

## 2024-08-27 MED ORDER — LISINOPRIL 2.5 MG PO TABS
5.0000 mg | ORAL_TABLET | Freq: Every day | ORAL | Status: DC
  Administered 2024-08-27 – 2024-08-28 (×2): 5 mg via ORAL
  Filled 2024-08-27 (×2): qty 2

## 2024-08-27 MED ORDER — MAGIC MOUTHWASH
5.0000 mL | Freq: Four times a day (QID) | ORAL | Status: DC
  Administered 2024-08-27 – 2024-08-28 (×3): 5 mL via ORAL
  Filled 2024-08-27 (×7): qty 5

## 2024-08-27 NOTE — Telephone Encounter (Signed)
 Called and LMVM for son, [name] that calling back and to return call.

## 2024-08-27 NOTE — Care Management Important Message (Signed)
 Important Message  Patient Details  Name: Howard Johnson MRN: [id number] Date of Birth: [birth year]   Important Message Given:  Yes - Medicare IM     Claretta Deed 08/27/2024, 3:18 PM

## 2024-08-27 NOTE — Procedures (Addendum)
 Patient Name: Howard Johnson  MRN: [id number]  Epilepsy Attending: Arlin MALVA Krebs  Referring Physician/Provider: Krebs Arlin MALVA, MD  Duration: 08/27/2024 0949 to 08/27/2024 1132   Patient history: 69 y.o. 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 EEG to evaluate for seizure   Level of alertness: Awake, asleep   AEDs during EEG study: Fycompa , LCM, Clonazepam , VPA   Technical aspects: This EEG study was done with scalp electrodes positioned according to the 10-20 International system of electrode placement. Electrical activity was reviewed with band pass filter of 1-70Hz , sensitivity of 7 uV/mm, display speed of 61mm/sec with a 60Hz  notched filter applied as appropriate. EEG data were recorded continuously and digitally stored.  Video monitoring was available and reviewed as appropriate.   Description: The posterior dominant rhythm consists of 9 Hz activity of moderate voltage (25-35 uV) seen predominantly in posterior head regions, symmetric and reactive to eye opening and eye closing.    IMPRESSION: This study is within normal limit. No seizures or epileptiform discharges were seen throughout the recording.   Mayu Ronk O [place] Nied

## 2024-08-27 NOTE — Progress Notes (Signed)
 EEG complete - results pending

## 2024-08-27 NOTE — Plan of Care (Signed)
   Problem: Clinical Measurements: Goal: Will remain free from infection Outcome: Progressing Goal: Diagnostic test results will improve Outcome: Progressing   Problem: Activity: Goal: Risk for activity intolerance will decrease Outcome: Progressing

## 2024-08-27 NOTE — Progress Notes (Signed)
 Subjective: Reportedly patient had some visual hallucination overnight where he was told that someone was being sexually abused.  This morning he does not member this.  ROS: negative except above   Examination  Vital signs in last 24 hours: Temp:  [98 F (36.7 C)-98.5 F (36.9 C)] 98.4 F (36.9 C) (01/15 0826) Pulse Rate:  [92-106] 106 (01/15 0826) Resp:  [14-18] 18 (01/15 0826) BP: (132-169)/(69-105) 169/98 (01/15 0826) SpO2:  [94 %-98 %] 98 % (01/15 0826)  General: Sitting in bed, not in apparent distress Neuro: MS: Alert, oriented, follows commands  CN: pupils equal and reactive,  EOMI, face symmetric, tongue midline, normal sensation over face, some right visual field defect but difficult to assess due to aphasia Motor: 5/5 strength in all 4 extremities Coordination: normal Gait: not tested  Basic Metabolic Panel: Recent Labs  Lab 08/24/24 1819  NA 143  K 4.1  CL 105  CO2 23  GLUCOSE 119*  BUN 29*  CREATININE 1.78*  CALCIUM  9.7    CBC: Recent Labs  Lab 08/24/24 1819  WBC 9.8  HGB 16.7  HCT 50.7  MCV 86.2  PLT 316     Coagulation Studies: No results for input(s): LABPROT, INR in the last 72 hours.  Imaging No new imaging overnight       ASSESSMENT AND PLAN:69 y.o. 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    Epilepsy with breakthrough seizure - In the setting of weaning off of Keppra  -Patient had visual hallucination if.  Before he got the increased dose of perampanel .  However perampanel  can worsen irritability.  Therefore we will reduce back to 2 mg nightly and start Depakote  500 mg twice daily instead because of its mood stabilization properties in addition to antiepileptic properties - Continue Vimpat  200 mg twice daily, Klonopin  1 mg nightly -I will also get an EEG this morning to make sure seizures have  not recurred - Speech therapy - Rest of the management per primary team - Discussed plan with hospitalist via secure chat -Continue to follow-up with [facility]    I personally spent a total of 50 minutes in the care of the patient today including getting/reviewing separately obtained history, performing a medically appropriate exam/evaluation, counseling and educating, placing orders, referring and communicating with other health care professionals, documenting clinical information in the EHR, independently interpreting results, and coordinating care.          Arlin Krebs Epilepsy [facility] For questions after 5pm please refer to AMION to reach the Neurologist on call

## 2024-08-27 NOTE — Plan of Care (Signed)
°  Problem: Education: Goal: Knowledge of General Education information will improve Description: Including pain rating scale, medication(s)/side effects and non-pharmacologic comfort measures Outcome: Progressing   Problem: Clinical Measurements: Goal: Ability to maintain clinical measurements within normal limits will improve Outcome: Progressing Goal: Diagnostic test results will improve Outcome: Progressing   Problem: Activity: Goal: Risk for activity intolerance will decrease Outcome: Progressing   Problem: Nutrition: Goal: Adequate nutrition will be maintained Outcome: Progressing   Problem: Coping: Goal: Level of anxiety will decrease Outcome: Progressing   Problem: Elimination: Goal: Will not experience complications related to bowel motility Outcome: Progressing

## 2024-08-27 NOTE — Progress Notes (Signed)
 " Progress Note    Howard Johnson   FMW:[id number]  DOB: 1955/10/16  DOA: 08/24/2024     3 PCP: Shona Norleen PEDLAR, MD  Initial CC: Seizure breakthrough  Hospital Course: Howard Johnson is a 69 y.o. male with PMH left temporal lobe SAH with surrounding edema, left transverse sinus thrombosis, history of seizure disorder with postictal aphasia, unsteady gait, HTN, HLD, DM II, CKD 3B, PAF on Eliquis . He presented with aphasia with concern for breakthrough seizure.  He was brought to the hospital via EMS and given Valium  in route.  He remained severely aphasic in the ER with concern for subclinical status.  He was loaded with Keppra  and home regimen otherwise continued.  Neurology consulted and LTM EEG placed.   Assessment/Plan  Nonconvulsive status epilepticus, POA History of postictal aphasia Neurology following LTM EEG -Started on brivaracetam  in place of Keppra  per neurology to help with history of agitation/behavioral issues with Keppra . However, prior to discharge, pharmacy assisted with PA and med returned at ~$430/mth. Discussed with neurology further and we will instead d/c briviact  and increase perampanel . He received dose on 1/14 @ 2012  of increased perampanel  and then nursing began reporting hallucinations around 8:40pm (unclear if they started after dose given the time frame, e.g. not long enough for med effect) - discussed further with neurology on 1/15; we will reduce perampanel  back to 2 mg and add on depakote  at this time.  I discussed with son and wife bedside also this morning and they are aware of the plan and agreeable - continue Klonopin  and Vimpat  - again monitor overnight and will anticipate d/c tomorrow if doing okay then    PAF History of left transverse sinus thrombosis - continue Eliquis  Continue fall precautions   Behavioral issues with Keppra  Home Keppra  held - see above changes    Hypertension - BP uptrending - resume lisinopril      Hyperlipidemia Resume home Crestor    Prediabetes Hemoglobin A1c 5.7 on 07/27/2024 Diet controlled   CKD 3B Renal function appears to be at baseline Creatinine 1.78 with GFR 41 - s/p IVF on admission in setting of seizure  - continue diet    Chronic HFpEF Euvolemic on exam Last 2D echo done on 09/27/2023 revealed LVEF 65 to 70% with grade 1 diastolic dysfunction Monitor volume status while on gentle IV fluid hydration LR at 50 cc/h x 1 day Strict I's and O's and daily weight.   Ambulatory dysfunction Mild unsteady gait at baseline PT OT evaluation Fall precautions  Interval History:   Developed some hallucinations last night.  Seems to have been delirium as dose of Fycompa  does not fully correlate with onset of hallucinations. Discussed with neurology.  Repeat EEG performed this morning also. Medications again adjusted today.  Fycompa  reduced and Depakote  added.  Discussed with son and wife bedside as well. Monitoring again overnight and we will target discharge tomorrow if doing okay still.  Antimicrobials: N/a  Consultants:  Neurology  Procedures:    DVT prophylaxis:   apixaban  (ELIQUIS ) tablet 5 mg   Code Status:   Code Status: Limited: Do not attempt resuscitation (DNR) -DNR-LIMITED -Do Not Intubate/DNI   Barriers to discharge: none Therapy evaluation: PT Orders: Active   PT Follow up Rec: Home Health Pt1/15/2026 0900  Disposition Plan:  TBD Status is: Inpt  Mobility Assessment (Last 72 Hours)     Mobility Assessment     Row Name 08/27/24 0900 08/27/24 0820 08/26/24 2000 08/26/24 1500 08/26/24 1400  Does the patient have exclusion criteria? -- No- Perform mobility assessment No- Perform mobility assessment -- No- Perform mobility assessment   What is the highest level of mobility based on the mobility assessment? Level 4 (Ambulates with assistance) - Balance while stepping forward/back - Complete Level 4 (Ambulates with assistance) - Balance while  stepping forward/back - Complete Level 4 (Ambulates with assistance) - Balance while stepping forward/back - Complete Level 4 (Ambulates with assistance) - Balance while stepping forward/back - Complete Level 4 (Ambulates with assistance) - Balance while stepping forward/back - Complete   Is the above level different from baseline mobility prior to current illness? -- -- Yes - Recommend PT order -- Yes - Recommend PT order    Row Name 08/26/24 1000 08/26/24 0739 08/25/24 2000 08/25/24 1735     Does the patient have exclusion criteria? -- No- Perform mobility assessment No- Perform mobility assessment No- Perform mobility assessment    What is the highest level of mobility based on the mobility assessment? Level 4 (Ambulates with assistance) - Balance while stepping forward/back - Complete Level 4 (Ambulates with assistance) - Balance while stepping forward/back - Complete Level 4 (Ambulates with assistance) - Balance while stepping forward/back - Complete Level 3 (Stands with assistance) - Balance while standing  and cannot march in place    Is the above level different from baseline mobility prior to current illness? -- -- Yes - Recommend PT order Yes - Recommend PT order       Diet: Diet Orders (From admission, onward)     Start     Ordered   08/26/24 1732  Diet regular Fluid consistency: Thin  Diet effective now       Question:  Fluid consistency:  Answer:  Thin   08/26/24 1731   08/26/24 0000  Diet - low sodium heart healthy        08/26/24 1258            Objective: Blood pressure (!) 162/119, pulse (!) 119, temperature 98.7 F (37.1 C), temperature source Oral, resp. rate 18, weight 93.4 kg, SpO2 95%.  Examination:  Physical Exam Constitutional:      Comments: Resting in bed in no distress with much improved aphasia  HENT:     Head: Normocephalic and atraumatic.     Mouth/Throat:     Mouth: Mucous membranes are moist.  Eyes:     Extraocular Movements: Extraocular  movements intact.  Cardiovascular:     Rate and Rhythm: Normal rate and regular rhythm.  Pulmonary:     Effort: Pulmonary effort is normal.     Breath sounds: Normal breath sounds.  Abdominal:     General: Bowel sounds are normal. There is no distension.     Palpations: Abdomen is soft.     Tenderness: There is no abdominal tenderness.  Musculoskeletal:        General: Normal range of motion.     Cervical back: Normal range of motion and neck supple.  Skin:    General: Skin is warm and dry.  Neurological:     Comments: Follows commands easily this morning and aphasia has improved      Data Reviewed: No results found for this or any previous visit (from the past 24 hours).   I have reviewed pertinent nursing notes, vitals, labs, and images as necessary. I have ordered labwork to follow up on as indicated.  I have reviewed the last notes from staff over past 24 hours. I have discussed  patient's care plan and test results with nursing staff, CM/SW, and other staff as appropriate.  Old records reviewed in assessment of this patient  Time spent: Greater than 50% of the 55 minute visit was spent in counseling/coordination of care for the patient as laid out in the A&P.   LOS: 3 days   Alm Apo, MD [facility] 08/27/2024, 2:42 PM "

## 2024-08-27 NOTE — Progress Notes (Signed)
 Physical Therapy Treatment Patient Details Name: Howard Johnson MRN: [id number] DOB: 09/02/1955 Today's Date: 08/27/2024   History of Present Illness Pt is a 69 y.o male presented to [facility] 1/12 for possible seizure, and global aphasia. Transferred to [facility] for EEG. EEG showed epileptogenicity and cortical dysfunction in L temporal region. Recent admission 12/17 for evolution left temporal lobe hematoma since February. PMHx: L temporal ICH, cerebral venous thrombosis, T2DM, HTN, CKD III, HLD, seizures    PT Comments  Pt tolerated treatment well today. Pt today was able to ambulate in hallway with no AD CGA and perform DGI. Pt scored 17/24 on DGI indicating that he still is a fall risk however demonstrated improvement from previous session where he scored a 13/24. Pt also able to perform cognitive dual tasking activities in hallway with pt demonstrating good path finding skills. No change in DC/DME recs at this time. PT will continue to follow.     If plan is discharge home, recommend the following: A little help with walking and/or transfers;A little help with bathing/dressing/bathroom;Assistance with cooking/housework;Assist for transportation;Help with stairs or ramp for entrance;Supervision due to cognitive status;Direct supervision/assist for medications management   Can travel by private vehicle        Equipment Recommendations  None recommended by PT    Recommendations for Other Services       Precautions / Restrictions Precautions Precautions: Fall Recall of Precautions/Restrictions: Impaired Precaution/Restrictions Comments: aphasia Restrictions Weight Bearing Restrictions Per Provider Order: No     Mobility  Bed Mobility Overal bed mobility: Modified Independent                  Transfers Overall transfer level: Needs assistance Equipment used: None Transfers: Sit to/from Stand Sit to Stand: Supervision           General transfer comment: Supervision for  safety.    Ambulation/Gait Ambulation/Gait assistance: Contact guard assist Gait Distance (Feet): 600 Feet Assistive device: None Gait Pattern/deviations: Step-through pattern, Decreased stride length, Decreased step length - right, Decreased step length - left, Trunk flexed, Narrow base of support, Drifts right/left Gait velocity: reduced Gait velocity interpretation: >2.62 ft/sec, indicative of community ambulatory   General Gait Details: no LOB noted. Noted to drift L/R with head turns however was able to avoid obstacles better this sesion. Able to perform DGI.   Stairs Stairs: Yes Stairs assistance: Contact guard assist Stair Management: Two rails, Alternating pattern, Forwards Number of Stairs: 2 General stair comments: Part of DGI   Wheelchair Mobility     Tilt Bed    Modified Rankin (Stroke Patients Only)       Balance Overall balance assessment: Needs assistance Sitting-balance support: No upper extremity supported, Feet supported Sitting balance-Leahy Scale: Good Sitting balance - Comments: Pt sat EOB with close supervision   Standing balance support: No upper extremity supported, During functional activity Standing balance-Leahy Scale: Fair Standing balance comment: CGA                 Standardized Balance Assessment Standardized Balance Assessment : Dynamic Gait Index   Dynamic Gait Index Level Surface: Normal Change in Gait Speed: Normal Gait with Horizontal Head Turns: Moderate Impairment Gait with Vertical Head Turns: Mild Impairment Gait and Pivot Turn: Mild Impairment Step Over Obstacle: Mild Impairment Step Around Obstacles: Mild Impairment Steps: Mild Impairment Total Score: 17      Communication Communication Communication: Impaired Factors Affecting Communication: Difficulty expressing self  Cognition Arousal: Alert Behavior During Therapy: [facility] for tasks  assessed/performed   PT - Cognitive impairments: Awareness, Attention,  Problem solving                       PT - Cognition Comments: Pt much less impulsive today compared to yesterday. Followed commands appropriate Following commands: Impaired Following commands impaired: Follows one step commands with increased time, Follows one step commands inconsistently    Cueing Cueing Techniques: Verbal cues, Tactile cues  Exercises Other Exercises Other Exercises: Assessed peripheral vision with pt noted to have some inconsistencies.    General Comments General comments (skin integrity, edema, etc.): VSS on RA      Pertinent Vitals/Pain Pain Assessment Pain Assessment: No/denies pain    Home Living                          Prior Function            PT Goals (current goals can now be found in the care plan section) Progress towards PT goals: Progressing toward goals    Frequency    Min 2X/week      PT Plan      Co-evaluation              AM-PAC PT 6 Clicks Mobility   Outcome Measure  Help needed turning from your back to your side while in a flat bed without using bedrails?: A Little Help needed moving from lying on your back to sitting on the side of a flat bed without using bedrails?: A Little Help needed moving to and from a bed to a chair (including a wheelchair)?: A Little Help needed standing up from a chair using your arms (e.g., wheelchair or bedside chair)?: A Little Help needed to walk in hospital room?: A Little Help needed climbing 3-5 steps with a railing? : A Little 6 Click Score: 18    End of Session Equipment Utilized During Treatment: Gait belt Activity Tolerance: Patient tolerated treatment well Patient left: with call bell/phone within reach;in chair;with chair alarm set Nurse Communication: Mobility status PT Visit Diagnosis: Difficulty in walking, not elsewhere classified (R26.2);Other abnormalities of gait and mobility (R26.89);Unsteadiness on feet (R26.81);Muscle weakness (generalized)  (M62.81)     Time: 9150-9099 PT Time Calculation (min) (ACUTE ONLY): 11 min  Charges:    $Gait Training: 8-22 mins PT General Charges $$ ACUTE PT VISIT: 1 Visit                     Sueellen NOVAK, PT, DPT Acute Rehab Services [phone]    Christino Mcglinchey 08/27/2024, 9:21 AM

## 2024-08-28 ENCOUNTER — Other Ambulatory Visit (HOSPITAL_COMMUNITY): Payer: Self-pay

## 2024-08-28 DIAGNOSIS — G40901 Epilepsy, unspecified, not intractable, with status epilepticus: Secondary | ICD-10-CM | POA: Diagnosis not present

## 2024-08-28 MED ORDER — DIVALPROEX SODIUM 500 MG PO DR TAB
500.0000 mg | DELAYED_RELEASE_TABLET | Freq: Two times a day (BID) | ORAL | 5 refills | Status: AC
  Filled 2024-08-28: qty 60, 30d supply, fill #0

## 2024-08-28 NOTE — TOC Transition Note (Signed)
 Transition of Care [facility]) - Discharge Note   Patient Details  Name: Howard Johnson MRN: [id number] Date of Birth: April 21, 1956  [facility] CM/SW Contact:  Andrez JULIANNA George, RN Phone Number: 08/28/2024, 11:50 AM   Clinical Narrative:     Pt is discharging home with home health services through [facility]. Information on the AVS. Hedda will contact them for the first home visit.  Pt has family supervision at home.  Family providing transportation.  Final next level of care: Home w Home Health Services Barriers to Discharge: No Barriers Identified   Patient Goals and CMS Choice   CMS Medicare.gov Compare Post Acute Care list provided to:: Patient Represenative (must comment) Choice offered to / list presented to : Adult Children, Spouse      Discharge Placement                       Discharge Plan and Services Additional resources added to the After Visit Summary for     Discharge Planning Services: CM Consult                      HH Arranged: PT, OT, Speech Therapy HH Agency: [facility] Date [facility] Agency Contacted: 08/28/24   Representative spoke with at [facility] Agency: Darleene  Social Drivers of Health (SDOH) Interventions SDOH Screenings   Food Insecurity: Patient Unable To Answer (07/29/2024)  Housing: Low Risk (08/03/2024)  Transportation Needs: Patient Unable To Answer (07/29/2024)  Utilities: Patient Unable To Answer (07/29/2024)  Depression (PHQ2-9): Low Risk (10/16/2023)  Social Connections: Socially Integrated (08/03/2024)  Tobacco Use: Low Risk (07/27/2024)     Readmission Risk Interventions     No data to display

## 2024-08-28 NOTE — Discharge Summary (Signed)
 " Physician Discharge Summary   Howard Johnson FMW:[id number] DOB: 04-Oct-1955 DOA: 08/24/2024  PCP: Shona Norleen PEDLAR, MD  Admit date: 08/24/2024 Discharge date: 08/28/2024  Admitted From: Home Disposition:  Home Discharging physician: Alm Apo, MD Barriers to discharge: none  Recommendations at discharge: Continue follow-up with neurology as planned   Discharge Condition: stable CODE STATUS: DNR Diet recommendation:  Diet Orders (From admission, onward)     Start     Ordered   08/28/24 0000  Diet general        08/28/24 1031   08/26/24 1732  Diet regular Fluid consistency: Thin  Diet effective now       Question:  Fluid consistency:  Answer:  Thin   08/26/24 1731   08/26/24 0000  Diet - low sodium heart healthy        08/26/24 1258            Hospital Course: Howard Johnson is a 69 y.o. male with PMH left temporal lobe SAH with surrounding edema, left transverse sinus thrombosis, history of seizure disorder with postictal aphasia, unsteady gait, HTN, HLD, DM II, CKD 3B, PAF on Eliquis . He presented with aphasia with concern for breakthrough seizure.  He was brought to the hospital via EMS and given Valium  in route.  He remained severely aphasic in the ER with concern for subclinical status.  He was loaded with Keppra  and home regimen otherwise continued.  Neurology consulted and LTM EEG placed.   Assessment/Plan  Nonconvulsive status epilepticus, POA History of postictal aphasia Neurology following LTM EEG -Started on brivaracetam  in place of Keppra  per neurology to help with history of agitation/behavioral issues with Keppra . However, prior to discharge, pharmacy assisted with PA and med returned at ~$430/mth. Discussed with neurology further and we will instead d/c briviact  and increase perampanel . He received dose on 1/14 @ 2012  of increased perampanel  and then nursing began reporting hallucinations around 8:40pm (unclear if they started after dose given the  time frame, e.g. not long enough for med effect) - discussed further with neurology on 1/15; we will reduce perampanel  back to 2 mg and add on depakote  at this time.  I discussed with son and wife bedside also this morning and they are aware of the plan and agreeable - continue Klonopin  and Vimpat  - Tolerated new regimen with monitoring.  No agitation nor hallucinations and rested well. -Final regimen at discharge includes: Depakote , perampanel , Klonopin , Vimpat    PAF History of left transverse sinus thrombosis - continue Eliquis  Continue fall precautions   Behavioral issues with Keppra  Home Keppra  continued - see above changes    Hypertension - BP uptrending - resume lisinopril     Hyperlipidemia Resume home Crestor    Prediabetes Hemoglobin A1c 5.7 on 07/27/2024 Diet controlled   CKD 3B Renal function appears to be at baseline Creatinine 1.78 with GFR 41 - s/p IVF on admission in setting of seizure  - continue diet    Chronic HFpEF Euvolemic on exam Last 2D echo done on 09/27/2023 revealed LVEF 65 to 70% with grade 1 diastolic dysfunction Monitor volume status while on gentle IV fluid hydration LR at 50 cc/h x 1 day Strict I's and O's and daily weight.   Ambulatory dysfunction Mild unsteady gait at baseline PT OT evaluation Fall precautions   The patient's acute and chronic medical conditions were treated accordingly. On day of discharge, patient was felt deemed stable for discharge. Patient/family member advised to call PCP or come back to ER if  needed.   Principal Diagnosis: Non-convulsive status epilepticus [facility])  Discharge Diagnoses: Active Hospital Problems   Diagnosis Date Noted   Non-convulsive status epilepticus (HCC) 08/24/2024    Priority: 1.   PAF (paroxysmal atrial fibrillation) (HCC) 08/25/2024   DM (diabetes mellitus), type 2 (HCC) 10/01/2023   Essential hypertension 10/01/2023   Hyperlipidemia 10/01/2023    Resolved Hospital Problems  No  resolved problems to display.     Discharge Instructions     Diet - low sodium heart healthy   Complete by: As directed    Diet general   Complete by: As directed    Increase activity slowly   Complete by: As directed    Increase activity slowly   Complete by: As directed       Allergies as of 08/28/2024       Reactions   Latex Other (See Comments)   Redness Skin irritation Reaction to condom cath   Tape Dermatitis, Other (See Comments)   Skin irritation  Very thin skin, tears easily        Medication List     STOP taking these medications    levETIRAcetam  1000 MG tablet Commonly known as: KEPPRA        TAKE these medications    acetaminophen  650 MG CR tablet Commonly known as: TYLENOL  Take 650 mg by mouth 2 (two) times daily.   azelastine 0.1 % nasal spray Commonly known as: ASTELIN Place 2 sprays into both nostrils daily.   clonazePAM  1 MG tablet Commonly known as: KLONOPIN  Take 1 tablet (1 mg total) by mouth at bedtime.   diazePAM  (20 MG Dose) 2 x 10 MG/0.1ML Lqpk Place 20 mg into the nose as needed (seizure lasting over 2 minutes).   divalproex  500 MG DR tablet Commonly known as: DEPAKOTE  Take 1 tablet (500 mg total) by mouth 2 (two) times daily.   Eliquis  5 MG Tabs tablet Generic drug: apixaban  Take 1 tablet (5 mg total) by mouth 2 (two) times daily.   Fycompa  2 MG tablet Generic drug: perampanel  Take 1 tablet (2 mg total) by mouth at bedtime.   lacosamide  200 MG Tabs tablet Commonly known as: VIMPAT  Take 1 tablet (200 mg total) by mouth 2 (two) times daily.   lisinopril  5 MG tablet Commonly known as: ZESTRIL  Take 5 mg by mouth every evening.   rosuvastatin  10 MG tablet Commonly known as: CRESTOR  Take 1 tablet (10 mg total) by mouth daily with supper.   Rybelsus 7 MG Tabs Generic drug: Semaglutide Take 7 mg by mouth daily before breakfast. Take one tablet (7mg ) by mouth daily 20 minutes prior to eating breakfast every morning.    simethicone  125 MG chewable tablet Commonly known as: MYLICON Chew 125 mg by mouth every 6 (six) hours as needed for flatulence.        Contact information for after-discharge care     Home Medical Care     [facility]) .   Service: Home Health Services Contact information: [address] [place]  [address] [phone]                    Allergies[1]  Consultations: Neurology  Procedures:   Discharge Exam: BP (!) 151/78 (BP Location: Left Arm)   Pulse 98   Temp 98.9 F (37.2 C) (Oral)   Resp 15   Wt 92.7 kg   SpO2 98%   BMI 26.24 kg/m  Physical Exam Constitutional:  Comments: Resting in bed in no distress with much improved aphasia  HENT:     Head: Normocephalic and atraumatic.     Mouth/Throat:     Mouth: Mucous membranes are moist.  Eyes:     Extraocular Movements: Extraocular movements intact.  Cardiovascular:     Rate and Rhythm: Normal rate and regular rhythm.  Pulmonary:     Effort: Pulmonary effort is normal.     Breath sounds: Normal breath sounds.  Abdominal:     General: Bowel sounds are normal. There is no distension.     Palpations: Abdomen is soft.     Tenderness: There is no abdominal tenderness.  Musculoskeletal:        General: Normal range of motion.     Cervical back: Normal range of motion and neck supple.  Skin:    General: Skin is warm and dry.  Neurological:     Comments: Follows commands easily this morning and aphasia has improved      The results of significant diagnostics from this hospitalization (including imaging, microbiology, ancillary and laboratory) are listed below for reference.   Microbiology: No results found for this or any previous visit (from the past 240 hours).   Labs: BNP (last 3 results) No results for input(s): BNP in the last 8760 hours. Basic Metabolic Panel: Recent Labs  Lab 08/24/24 1819  NA 143  K 4.1  CL 105  CO2 23   GLUCOSE 119*  BUN 29*  CREATININE 1.78*  CALCIUM  9.7   Liver Function Tests: No results for input(s): AST, ALT, ALKPHOS, BILITOT, PROT, ALBUMIN in the last 168 hours. No results for input(s): LIPASE, AMYLASE in the last 168 hours. No results for input(s): AMMONIA in the last 168 hours. CBC: Recent Labs  Lab 08/24/24 1819  WBC 9.8  HGB 16.7  HCT 50.7  MCV 86.2  PLT 316   Cardiac Enzymes: No results for input(s): CKTOTAL, CKMB, CKMBINDEX, TROPONINI in the last 168 hours. BNP: Invalid input(s): POCBNP CBG: No results for input(s): GLUCAP in the last 168 hours. D-Dimer No results for input(s): DDIMER in the last 72 hours. Hgb A1c No results for input(s): HGBA1C in the last 72 hours. Lipid Profile No results for input(s): CHOL, HDL, LDLCALC, TRIG, CHOLHDL, LDLDIRECT in the last 72 hours. Thyroid  function studies No results for input(s): TSH, T4TOTAL, T3FREE, THYROIDAB in the last 72 hours.  Invalid input(s): FREET3 Anemia work up No results for input(s): VITAMINB12, FOLATE, FERRITIN, TIBC, IRON, RETICCTPCT in the last 72 hours. Urinalysis    Component Value Date/Time   COLORURINE STRAW (A) 09/26/2023 1148   APPEARANCEUR CLEAR 09/26/2023 1148   LABSPEC 1.030 09/26/2023 1148   PHURINE 5.0 09/26/2023 1148   GLUCOSEU >=500 (A) 09/26/2023 1148   HGBUR SMALL (A) 09/26/2023 1148   BILIRUBINUR NEGATIVE 09/26/2023 1148   KETONESUR 20 (A) 09/26/2023 1148   PROTEINUR NEGATIVE 09/26/2023 1148   NITRITE NEGATIVE 09/26/2023 1148   LEUKOCYTESUR NEGATIVE 09/26/2023 1148   Sepsis Labs Recent Labs  Lab 08/24/24 1819  WBC 9.8   Microbiology No results found for this or any previous visit (from the past 240 hours).  Procedures/Studies: Adult EEG prolonged 61-120 minutes Result Date: 08/27/2024 Shelton Arlin KIDD, MD     08/28/2024  8:50 AM Patient Name: Howard Johnson MRN: [id number] Epilepsy Attending:  Arlin KIDD Shelton Referring Physician/Provider: Shelton Arlin KIDD, MD Duration: 08/27/2024 0949 to 08/27/2024 1132  Patient history: 69 y.o. male with a history of left temporal intraparenchymal hematoma  secondary to dural venous sinus thrombosis who presents with acute aphasia in setting of titrating down on Keppra . EEG to evaluate for seizure  Level of alertness: Awake, asleep  AEDs during EEG study: Fycompa , LCM, Clonazepam , VPA  Technical aspects: This EEG study was done with scalp electrodes positioned according to the 10-20 International system of electrode placement. Electrical activity was reviewed with band pass filter of 1-70Hz , sensitivity of 7 uV/mm, display speed of 57mm/sec with a 60Hz  notched filter applied as appropriate. EEG data were recorded continuously and digitally stored.  Video monitoring was available and reviewed as appropriate.  Description: The posterior dominant rhythm consists of 9 Hz activity of moderate voltage (25-35 uV) seen predominantly in posterior head regions, symmetric and reactive to eye opening and eye closing.  IMPRESSION: This study is within normal limit. No seizures or epileptiform discharges were seen throughout the recording.  Priyanka MALVA Krebs   Overnight EEG with video Result Date: 08/25/2024 Krebs Arlin MALVA, MD     08/26/2024 11:25 AM Patient Name: Howard Johnson MRN: [id number] Epilepsy Attending: Arlin MALVA Krebs Referring Physician/Provider: Michaela Aisha SQUIBB, MD Duration: 08/24/2024 2231 to 08/25/2024 1708, 08/26/2024 0751 to 1005 Patient history: 69 y.o. 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 EEG to evaluate for seizure Level of alertness: Awake, asleep AEDs during EEG study: Fycompa , LCM, Clonazepam , BRV Technical aspects: This EEG study was done with scalp electrodes positioned according to the 10-20 International  system of electrode placement. Electrical activity was reviewed with band pass filter of 1-70Hz , sensitivity of 7 uV/mm, display speed of 72mm/sec with a 60Hz  notched filter applied as appropriate. EEG data were recorded continuously and digitally stored.  Video monitoring was available and reviewed as appropriate. Description: The posterior dominant rhythm consists of 9 Hz activity of moderate voltage (25-35 uV) seen predominantly in posterior head regions, symmetric and reactive to eye opening and eye closing. Sleep was characterized by vertex waves, sleep spindles (12 to 14 Hz), maximal frontocentral region. EEG showed continuous 3-5 hz theta- delta slowing in left hemisphere, maximal left temporal region.  Lateralized periodic discharges were noted in left hemisphere, maximal left temporal region at 0.5-1 Hz. Hyperventilation and photic stimulation were not performed.   Patient was moved form ED to floor and EEG was not reconnected. Therefore eeg file is missing between 08/25/2024 1708 to 08/26/2024 0751 ABNORMALITY - Lateralized periodic discharges ( LPD), left hemisphere - Continuous slow,  left hemisphere IMPRESSION: This study showed evidence of epileptogenicity and cortical dysfunction in left hemisphere, maximal left temporal region, likely secondary to underlying structural abnormality, post-ictal state. No seizures were seen throughout the recording. Priyanka MALVA Krebs   CT HEAD WO CONTRAST ( ) Result Date: 08/24/2024 CLINICAL DATA:  Acute onset global aphasia EXAM: CT HEAD WITHOUT CONTRAST TECHNIQUE: Contiguous axial images were obtained from the base of the skull through the vertex without intravenous contrast. RADIATION DOSE REDUCTION: This exam was performed according to the departmental dose-optimization program which includes automated exposure control, adjustment of the mA and/or kV according to patient size and/or use of iterative reconstruction technique. COMPARISON:  MRI and head CT  07/27/2024, head CT 09/29/2023 FINDINGS: Brain: No acute territorial infarction, hemorrhage or intracranial mass. Encephalomalacia within the left temporal lobe corresponding to previously noted hemorrhage. No ventricular enlargement. Vascular: No hyperdense vessels.  Carotid vascular calcification Skull: Normal. Negative for fracture or focal lesion. Sinuses/Orbits:  No acute finding. Other: None IMPRESSION: 1. No CT evidence for acute intracranial abnormality. 2. Encephalomalacia within the left temporal lobe at the site of prior remote hemorrhage. Electronically Signed   By: Luke Bun M.D.   On: 08/24/2024 19:51     Time coordinating discharge: Over 30 minutes    Alm Apo, MD  [facility] 08/28/2024, 12:15 PM    [1]  Allergies Allergen Reactions   Latex Other (See Comments)    Redness Skin irritation  Reaction to condom cath   Tape Dermatitis and Other (See Comments)    Skin irritation  Very thin skin, tears easily   "

## 2024-08-28 NOTE — Progress Notes (Signed)
 Beryl Lemond Molt to be discharged home per MD order. Discussed with the patient, son [name], and wife Rollene, and all questions fully answered. TOC meds picked up from pharmacy. Skin clean, dry, and intact without evidence of skin break down. IV catheter discontinued intact. Site without signs and symptoms of complications. Dressing and pressure applied.  An After Visit Summary was printed and given to the patient.  Patient escorted via [facility], and discharged home via private auto.  Dorthy JONELLE Gay  08/28/2024 12:12 PM

## 2024-08-28 NOTE — Progress Notes (Signed)
 Physical Therapy Treatment Patient Details Name: Howard Johnson MRN: [id number] DOB: 29-Oct-1955 Today's Date: 08/28/2024   History of Present Illness Pt is a 69 y.o male presented to [facility] ED 1/12 for possible seizure, and global aphasia. Transferred to [facility] for EEG. EEG showed epileptogenicity and cortical dysfunction in L temporal region. Recent admission 12/17 for evolution left temporal lobe hematoma since February. PMHx: L temporal ICH, cerebral venous thrombosis, T2DM, HTN, CKD III, HLD, seizures    PT Comments  Pt tolerated treatment well today. Pt noted to be more confused today than previous sessions stating that everyone who likes me or voted for trump has been getting fired and that he doesn't want me to leave when pointing at call bell. Despite this, pt able to ambulate in hallway with no AD CGA/Min A and perform dynamic balance challenges as well as path finding activities. Pt more unsteady today compared to yesterday with a decrease in DGI score with a 15/24 compared to 17/24 yesterday. Pt also with significant difficulty path finding requiring max cues to find room. No change in DC/DME recs at this time. PT will continue to follow.     If plan is discharge home, recommend the following: A little help with walking and/or transfers;A little help with bathing/dressing/bathroom;Assistance with cooking/housework;Assist for transportation;Help with stairs or ramp for entrance;Supervision due to cognitive status;Direct supervision/assist for medications management   Can travel by private vehicle        Equipment Recommendations  None recommended by PT    Recommendations for Other Services       Precautions / Restrictions Precautions Precautions: Fall Recall of Precautions/Restrictions: Impaired Precaution/Restrictions Comments: aphasia Restrictions Weight Bearing Restrictions Per Provider Order: No     Mobility  Bed Mobility Overal bed mobility: Modified Independent              General bed mobility comments: Increased time, no assist. Attempting to get out of bed with bed rail still up.    Transfers Overall transfer level: Needs assistance Equipment used: None Transfers: Sit to/from Stand Sit to Stand: Supervision           General transfer comment: Supervision for safety.    Ambulation/Gait Ambulation/Gait assistance: Min assist, Contact guard assist Gait Distance (Feet): 450 Feet Assistive device: None Gait Pattern/deviations: Step-through pattern, Decreased stride length, Decreased step length - right, Decreased step length - left, Trunk flexed, Narrow base of support, Drifts right/left, Scissoring Gait velocity: reduced Gait velocity interpretation: >2.62 ft/sec, indicative of community ambulatory   General Gait Details: Pt more unsteady today compared to previous session. Few LOBs with dynamic gait activities requiring up to Min A to correct. Able to perform DGI again.   Stairs Stairs: Yes Stairs assistance: Contact guard assist Stair Management: Two rails, Alternating pattern, Forwards Number of Stairs: 4 General stair comments: Part of DGI   Wheelchair Mobility     Tilt Bed    Modified Rankin (Stroke Patients Only)       Balance Overall balance assessment: Needs assistance Sitting-balance support: No upper extremity supported, Feet supported Sitting balance-Leahy Scale: Good Sitting balance - Comments: Pt sat EOB with close supervision   Standing balance support: No upper extremity supported, During functional activity Standing balance-Leahy Scale: Fair Standing balance comment: CGA                 Standardized Balance Assessment Standardized Balance Assessment : Dynamic Gait Index   Dynamic Gait Index Level Surface: Normal Change in Gait Speed: Mild Impairment  Gait with Horizontal Head Turns: Moderate Impairment Gait with Vertical Head Turns: Moderate Impairment Gait and Pivot Turn: Mild  Impairment Step Over Obstacle: Mild Impairment Step Around Obstacles: Mild Impairment Steps: Mild Impairment Total Score: 15      Communication Communication Communication: Impaired Factors Affecting Communication: Difficulty expressing self  Cognition Arousal: Alert Behavior During Therapy: WFL for tasks assessed/performed   PT - Cognitive impairments: Awareness, Attention, Problem solving                       PT - Cognition Comments: Pt appeared to be more confused today stating that everyone who likes me or voted for trump get fired and that he doesnt want me to leave pointing to the call bell. Following commands: Impaired Following commands impaired: Follows one step commands with increased time, Follows one step commands inconsistently    Cueing Cueing Techniques: Verbal cues, Tactile cues  Exercises Other Exercises Other Exercises: Able to perform figure 8s between cones set up in hallway. Other Exercises: toe taps with cones in hallway Other Exercises: Toe taps on step x10    General Comments General comments (skin integrity, edema, etc.): VSS      Pertinent Vitals/Pain Pain Assessment Pain Assessment: No/denies pain    Home Living                          Prior Function            PT Goals (current goals can now be found in the care plan section) Progress towards PT goals: Progressing toward goals    Frequency    Min 2X/week      PT Plan      Co-evaluation              AM-PAC PT 6 Clicks Mobility   Outcome Measure  Help needed turning from your back to your side while in a flat bed without using bedrails?: A Little Help needed moving from lying on your back to sitting on the side of a flat bed without using bedrails?: A Little Help needed moving to and from a bed to a chair (including a wheelchair)?: A Little Help needed standing up from a chair using your arms (e.g., wheelchair or bedside chair)?: A Little Help  needed to walk in hospital room?: A Little Help needed climbing 3-5 steps with a railing? : A Little 6 Click Score: 18    End of Session Equipment Utilized During Treatment: Gait belt Activity Tolerance: Patient tolerated treatment well Patient left: with call bell/phone within reach;in chair;with chair alarm set Nurse Communication: Mobility status PT Visit Diagnosis: Difficulty in walking, not elsewhere classified (R26.2);Other abnormalities of gait and mobility (R26.89);Unsteadiness on feet (R26.81);Muscle weakness (generalized) (M62.81)     Time: 9150-9096 PT Time Calculation (min) (ACUTE ONLY): 14 min  Charges:    $Gait Training: 8-22 mins PT General Charges $$ ACUTE PT VISIT: 1 Visit                     Melvinia Ashby B, PT, DPT Acute Rehab Services [phone]    Howard Johnson 08/28/2024, 11:01 AM

## 2024-08-31 ENCOUNTER — Telehealth: Payer: Self-pay

## 2024-08-31 NOTE — Patient Instructions (Signed)
 Visit Information  Thank you for taking time to visit with me today. Please don't hesitate to contact me if I can be of assistance to you before our next scheduled telephone appointment.  Our next appointment is by telephone on 09/07/24 at 2:00 pm  Following is a copy of your care plan:   Goals Addressed             This Visit's Progress    VBCI Transitions of Care (TOC) Care Plan       Problems:  Recent Hospitalization for treatment of Seizures Knowledge Deficit Related to Seizures  Goal:  Over the next 30 days, the patient will not experience hospital readmission  Interventions:  Transitions of Care: Doctor Visits  - discussed the importance of doctor visits Communication with PCP re: TOC 30 day program enrollment Reviewed seizure and seizure precautions Advised to:  Take the medication exactly as prescribed Do not stop suddenly (risk of seizure rebound) Call provider before making dose changes  Patient Self Care Activities:  Attend all scheduled provider appointments Call pharmacy for medication refills 3-7 days in advance of running out of medications Call provider office for new concerns or questions  Notify RN Care Manager of TOC call rescheduling needs Participate in Transition of Care Program/Attend TOC scheduled calls Take medications as prescribed   Please monitor and report the following to physician:  Increased seizure frequency New or worsening symptoms Medication side effects Falls, injuries, confusion Missed doses High stress levels    Plan:  The patient has been provided with contact information for the care management team and has been advised to call with any health related questions or concerns.         Patient verbalizes understanding of instructions and care plan provided today and agrees to view in MyChart. Active MyChart status and patient understanding of how to access instructions and care plan via MyChart confirmed with patient.      The patient has been provided with contact information for the care management team and has been advised to call with any health related questions or concerns.   Please call the care guide team at [phone] if you need to cancel or reschedule your appointment.   Please call the Suicide and Crisis Lifeline: 988 if you are experiencing a Mental Health or Behavioral Health Crisis or need someone to talk to.  Deasha Clendenin J. Jessy Cybulski RN, MSN [facility], [facility] RN Care Manager Direct Dial: [phone]  Fax: [phone] Website: [email or web address]

## 2024-08-31 NOTE — Transitions of Care (Post Inpatient/ED Visit) (Signed)
 "  08/31/2024  Name: Howard Johnson MRN: [id number] DOB: 04/03/1956  Today's TOC FU Call Status: Today's TOC FU Call Status:: Successful TOC FU Call Completed TOC FU Call Complete Date: 08/31/24  Patient's Name and Date of Birth confirmed. 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 How have you been since you were released from the hospital?: Better Any questions or concerns?: No  Items Reviewed: Did you receive and understand the discharge instructions provided?: Yes Medications obtained,verified, and reconciled?: Yes (Medications Reviewed) Any new allergies since your discharge?: No Dietary orders reviewed?: Yes Type of Diet Ordered:: Low Sodium Heart Healthy Do you have support at home?: Yes People in Home [RPT]: spouse, child(ren), adult Name of Support/Comfort Primary Source: Son-[name] and wife Rollene  Medications Reviewed Today: Medications Reviewed Today     Reviewed by Ivanell Deshotel, RN (Case Manager) on 08/31/24 at 1128  Med List Status: <None>   Medication Order Taking? Sig Documenting Provider Last Dose Status Informant  acetaminophen  (TYLENOL ) 650 MG CR tablet [id number]  Take 650 mg by mouth 2 (two) times daily. [provider]  Active Child, Pharmacy Records  apixaban  (ELIQUIS ) 5 MG TABS tablet [id number] Yes Take 1 tablet (5 mg total) by mouth 2 (two) times daily. Maurice Sharlet RAMAN, PA-C  Active Child, Pharmacy Records  azelastine (ASTELIN) 0.1 % nasal spray [id number]  Place 2 sprays into both nostrils daily. [provider]  Active Child, Pharmacy Records  clonazePAM  (KLONOPIN ) 1 MG tablet [id number] Yes Take 1 tablet (1 mg total) by mouth at bedtime. Cherlyn Labella, MD  Active Child, Pharmacy Records  diazePAM , 20 MG Dose, 2 x 10  MG/0.1ML LQPK [id number] Yes Place 20 mg into the nose as needed (seizure lasting over 2 minutes). Yadav, Priyanka O, MD  Active Child, Pharmacy Records  divalproex  (DEPAKOTE ) 500 MG DR tablet [id number] Yes Take 1 tablet (500 mg total) by mouth 2 (two) times daily. Patsy Lenis, MD  Active   FYCOMPA  2 MG tablet [id number] Yes Take 1 tablet (2 mg total) by mouth at bedtime. Onita Duos, MD  Active Child, Pharmacy Records  lacosamide  (VIMPAT ) 200 MG TABS tablet [id number] Yes Take 1 tablet (200 mg total) by mouth 2 (two) times daily. Onita Duos, MD  Active Child, Pharmacy Records  lisinopril  (ZESTRIL ) 5 MG tablet [id number] Yes Take 5 mg by mouth every evening. [provider]  Active Child, Pharmacy Records  rosuvastatin  (CRESTOR ) 10 MG tablet [id number] Yes Take 1 tablet (10 mg total) by mouth daily with supper. Maurice Sharlet RAMAN, PA-C  Active Child, Pharmacy Records  Semaglutide [facility]) 7 MG TABS [id number] Yes Take 7 mg by mouth daily before breakfast. Take one tablet (7mg ) by mouth daily 20 minutes prior to eating breakfast every morning. [provider]  Active Child, Pharmacy Records           Med Note LEOBARDO, NICCI   Tue Aug 25, 2024 12:50 AM)    simethicone  (MYLICON) 125 MG chewable tablet [id number] Yes Chew 125 mg by mouth every 6 (six) hours as needed for flatulence. [provider]  Active Child, Pharmacy Records            Home Care and Equipment/Supplies: Were Home Health Services Ordered?: Yes Name of Home Health Agency:: [facility] Has Agency set up a time to come to your home?: Yes First  Home Health Visit Date: 09/07/24 Any new equipment or medical supplies ordered?: NA  Functional Questionnaire: Do you need assistance with bathing/showering or dressing?: No Do you need assistance with meal preparation?: No Do you need assistance with eating?: No Do you have difficulty maintaining continence: No Do you need assistance with getting out of bed/getting out of  a chair/moving?: No Do you have difficulty managing or taking your medications?: Yes (Son [name] assists)  Follow up appointments reviewed: PCP Follow-up appointment confirmed?: Yes Date of PCP follow-up appointment?: 09/14/24 Follow-up Provider: Dr. Shona Driscilla Salvage Follow-up appointment confirmed?: Yes Date of Specialist follow-up appointment?: 12/24/24 Follow-Up Specialty Provider:: Dr. onita Do you need transportation to your follow-up appointment?: No Do you understand care options if your condition(s) worsen?: Yes-patient verbalized understanding  SDOH Interventions Today    Flowsheet Row Most Recent Value  SDOH Interventions   Food Insecurity Interventions Intervention Not Indicated  Housing Interventions Intervention Not Indicated  Transportation Interventions Intervention Not Indicated, Patient Resources (Friends/Family)  Utilities Interventions Intervention Not Indicated    Maeson Lourenco J. Ravan Schlemmer RN, MSN [facility]  [facility], [facility] RN Care Manager Direct Dial: [phone]  Fax: [phone] Website: [email or web address]   "

## 2024-09-07 ENCOUNTER — Other Ambulatory Visit: Payer: Self-pay

## 2024-09-07 NOTE — Patient Instructions (Signed)
 Visit Information  Thank you for taking time to visit with me today. Please don't hesitate to contact me if I can be of assistance to you before our next scheduled telephone appointment.  Our next appointment is by telephone on 09/16/24 at 200 pm  Following is a copy of your care plan:   Goals Addressed             This Visit's Progress    VBCI Transitions of Care (TOC) Care Plan       Problems:  Recent Hospitalization for treatment of Seizures Knowledge Deficit Related to Seizures  Goal:  Over the next 30 days, the patient will not experience hospital readmission  Interventions:  Transitions of Care: Doctor Visits  - discussed the importance of doctor visits Communication with PCP re: TOC 30 day program enrollment Reiterated seizure and seizure precautions Advised to:  Take the medication exactly as prescribed Do not stop suddenly (risk of seizure rebound) Call provider before making dose changes  Patient Self Care Activities:  Attend all scheduled provider appointments Call pharmacy for medication refills 3-7 days in advance of running out of medications Call provider office for new concerns or questions  Notify RN Care Manager of TOC call rescheduling needs Participate in Transition of Care Program/Attend TOC scheduled calls Take medications as prescribed   Please monitor and report the following to physician:  Increased seizure frequency New or worsening symptoms Medication side effects Falls, injuries, confusion Missed doses High stress levels    Plan:  The patient has been provided with contact information for the care management team and has been advised to call with any health related questions or concerns.         Patient verbalizes understanding of instructions and care plan provided today and agrees to view in MyChart. Active MyChart status and patient understanding of how to access instructions and care plan via MyChart confirmed with patient.      The patient has been provided with contact information for the care management team and has been advised to call with any health related questions or concerns.   Please call the care guide team at [phone] if you need to cancel or reschedule your appointment.   Please call the Suicide and Crisis Lifeline: 988 if you are experiencing a Mental Health or Behavioral Health Crisis or need someone to talk to.  Zarianna Dicarlo J. Aaric Dolph RN, MSN [facility], [facility] RN Care Manager Direct Dial: [phone]  Fax: [phone] Website: [email or web address]

## 2024-09-07 NOTE — Transitions of Care (Post Inpatient/ED Visit) (Signed)
 " Transition of Care week 2  Visit Note  09/07/2024  Name: Manuel Dall MRN: [id number]          DOB: 1956/06/07  Situation: Patient enrolled in [facility] 30-day program. Visit completed with son [name] (DPR) by telephone.   Background:   Initial Transition Care Management Follow-up Telephone Call Discharge Date and Diagnosis: 08/28/24, Non-convulsive status epilepticus   Past Medical History:  Diagnosis Date   Achilles tendinitis    Left   Diabetes mellitus without complication (HCC)    Elevated PSA    Hyperlipidemia    Hypertension    Right knee pain    Shoulder pain, bilateral    Stroke [facility])     Assessment: Patient Reported Symptoms: Cognitive Cognitive Status: Alert and oriented to person, place, and time, Normal speech and language skills      Neurological Neurological Review of Symptoms: No symptoms reported Neurological Management Strategies: Medication therapy Neurological Comment: Patient with recent seizures. Medication adjusted. Report:  Increased seizure frequency  New or worsening symptoms  Medication side effects  Falls, injuries, confusion  Missed doses  High stress levels  HEENT HEENT Symptoms Reported: No symptoms reported      Cardiovascular Cardiovascular Symptoms Reported: No symptoms reported    Respiratory Respiratory Symptoms Reported: No symptoms reported    Endocrine Endocrine Symptoms Reported: No symptoms reported    Gastrointestinal Gastrointestinal Symptoms Reported: No symptoms reported      Genitourinary Genitourinary Symptoms Reported: No symptoms reported    Integumentary Integumentary Symptoms Reported: No symptoms reported    Musculoskeletal Musculoskelatal Symptoms Reviewed: Weakness Additional Musculoskeletal Details: General weakness, son reports he is carrying around his walker when hwe walks.  Home health pending due to weather Musculoskeletal Management Strategies: Exercise, Routine screening      Psychosocial Psychosocial  Symptoms Reported: Not assessed Additional Psychological Details: SonAlex completes the assessment         There were no vitals filed for this visit.    Medications Reviewed Today     Reviewed by Leveda Kendrix, RN (Case Manager) on 09/07/24 at 1400  Med List Status: <None>   Medication Order Taking? Sig Documenting Provider Last Dose Status Informant  acetaminophen  (TYLENOL ) 650 MG CR tablet [id number] Yes Take 650 mg by mouth 2 (two) times daily. [provider]  Active Child, Pharmacy Records  apixaban  (ELIQUIS ) 5 MG TABS tablet [id number] Yes Take 1 tablet (5 mg total) by mouth 2 (two) times daily. Maurice Sharlet RAMAN, PA-C  Active Child, Pharmacy Records  azelastine (ASTELIN) 0.1 % nasal spray [id number] Yes Place 2 sprays into both nostrils daily. [provider]  Active Child, Pharmacy Records  clonazePAM  (KLONOPIN ) 1 MG tablet [id number] Yes Take 1 tablet (1 mg total) by mouth at bedtime. Cherlyn Labella, MD  Active Child, Pharmacy Records  diazePAM , 20 MG Dose, 2 x 10 MG/0.1ML LQPK [id number] Yes Place 20 mg into the nose as needed (seizure lasting over 2 minutes). Yadav, Priyanka O, MD  Active Child, Pharmacy Records  divalproex  (DEPAKOTE ) 500 MG DR tablet [id number] Yes Take 1 tablet (500 mg total) by mouth 2 (two) times daily. Patsy Lenis, MD  Active   FYCOMPA  2 MG tablet [id number] Yes Take 1 tablet (2 mg total) by mouth at bedtime. Onita Duos, MD  Active Child, Pharmacy Records  lacosamide  (VIMPAT ) 200 MG TABS tablet [id number] Yes Take 1 tablet (200 mg total) by mouth 2 (two) times daily. Onita Duos, MD  Active Child, Pharmacy Records  lisinopril  (ZESTRIL ) 5  MG tablet [id number] Yes Take 5 mg by mouth every evening. [provider]  Active Child, Pharmacy Records  rosuvastatin  (CRESTOR ) 10 MG tablet [id number] Yes Take 1 tablet (10 mg total) by mouth daily with supper. Maurice Sharlet RAMAN, PA-C  Active Child, Pharmacy Records  Semaglutide [facility]) 7 MG TABS [id number]  Yes Take 7 mg by mouth daily before breakfast. Take one tablet (7mg ) by mouth daily 20 minutes prior to eating breakfast every morning. [provider]  Active Child, Pharmacy Records           Med Note LEOBARDO, NICCI   Tue Aug 25, 2024 12:50 AM)    simethicone  (MYLICON) 125 MG chewable tablet [id number] Yes Chew 125 mg by mouth every 6 (six) hours as needed for flatulence. [provider]  Active Child, Pharmacy Records            Goals Addressed             This Visit's Progress    VBCI Transitions of Care (TOC) Care Plan       Problems:  Recent Hospitalization for treatment of Seizures Knowledge Deficit Related to Seizures  Goal:  Over the next 30 days, the patient will not experience hospital readmission  Interventions:  Transitions of Care: Doctor Visits  - discussed the importance of doctor visits Communication with PCP re: TOC 30 day program enrollment Reiterated seizure and seizure precautions Advised to:  Take the medication exactly as prescribed Do not stop suddenly (risk of seizure rebound) Call provider before making dose changes  Patient Self Care Activities:  Attend all scheduled provider appointments Call pharmacy for medication refills 3-7 days in advance of running out of medications Call provider office for new concerns or questions  Notify RN Care Manager of TOC call rescheduling needs Participate in Transition of Care Program/Attend TOC scheduled calls Take medications as prescribed   Please monitor and report the following to physician:  Increased seizure frequency New or worsening symptoms Medication side effects Falls, injuries, confusion Missed doses High stress levels    Plan:  The patient has been provided with contact information for the care management team and has been advised to call with any health related questions or concerns.         Recommendation:   Continue Current Plan of Care  Follow Up Plan:    Telephone follow-up in 1 week  Dominiq Fontaine J. Sharri Loya RN, MSN [facility], [facility] Health RN Care Manager Direct Dial: [phone]  Fax: [phone] Website: [email or web address]      "

## 2024-09-16 ENCOUNTER — Telehealth: Payer: Self-pay

## 2024-09-17 ENCOUNTER — Telehealth: Payer: Self-pay

## 2024-09-17 NOTE — Transitions of Care (Post Inpatient/ED Visit) (Signed)
 " Transition of Care week 3  Visit Note  09/17/2024  Name: Howard Johnson MRN: [id number]          DOB: [birth year]  Situation: Patient enrolled in [facility] 30-day program. Visit completed with son Howard Johnson by telephone.   Background:   Initial Transition Care Management Follow-up Telephone Call Discharge Date and Diagnosis: 08/28/24, Non-convulsive status epilepticus   Past Medical History:  Diagnosis Date   Achilles tendinitis    Left   Diabetes mellitus without complication (HCC)    Elevated PSA    Hyperlipidemia    Hypertension    Right knee pain    Shoulder pain, bilateral    Stroke [facility])     Assessment: Patient Reported Symptoms: Cognitive Cognitive Status: Alert and oriented to person, place, and time, Normal speech and language skills (per son Howard Johnson)      Neurological Neurological Review of Symptoms: No symptoms reported Neurological Comment: Recent Hospitalization for seizures.  Reviewed seizure precautions.  HEENT HEENT Symptoms Reported: No symptoms reported      Cardiovascular Cardiovascular Symptoms Reported: No symptoms reported Does patient have uncontrolled Hypertension?: No    Respiratory Respiratory Symptoms Reported: No symptoms reported    Endocrine Endocrine Symptoms Reported: No symptoms reported Is patient diabetic?: No    Gastrointestinal Gastrointestinal Symptoms Reported: No symptoms reported      Genitourinary Genitourinary Symptoms Reported: No symptoms reported    Integumentary Integumentary Symptoms Reported: No symptoms reported    Musculoskeletal Musculoskelatal Symptoms Reviewed: Weakness Additional Musculoskeletal Details: Carrying walker around when he ambulates.  Home health to contact this week to resume services. Musculoskeletal Management Strategies: Exercise      Psychosocial Psychosocial Symptoms Reported: Not assessed Additional Psychological Details: Howard Johnson Howard Johnson completes the assessment         There were no vitals filed  for this visit.    Medications Reviewed Today     Reviewed by Keldric Poyer, RN (Case Manager) on 09/17/24 at 0920  Med List Status: <None>   Medication Order Taking? Sig Documenting Provider Last Dose Status Informant  acetaminophen  (TYLENOL ) 650 MG CR tablet [id number] Yes Take 650 mg by mouth 2 (two) times daily. [provider]  Active Child, Pharmacy Records  apixaban  (ELIQUIS ) 5 MG TABS tablet [id number] Yes Take 1 tablet (5 mg total) by mouth 2 (two) times daily. Maurice Sharlet RAMAN, PA-C  Active Child, Pharmacy Records  azelastine (ASTELIN) 0.1 % nasal spray [id number] Yes Place 2 sprays into both nostrils daily. [provider]  Active Child, Pharmacy Records  clonazePAM  (KLONOPIN ) 1 MG tablet [id number] Yes Take 1 tablet (1 mg total) by mouth at bedtime. Cherlyn Labella, MD  Active Child, Pharmacy Records  diazePAM , 20 MG Dose, 2 x 10 MG/0.1ML LQPK [id number] Yes Place 20 mg into the nose as needed (seizure lasting over 2 minutes). Shelton Arlin KIDD, MD  Active Child, Pharmacy Records  divalproex  (DEPAKOTE ) 500 MG DR tablet [id number] Yes Take 1 tablet (500 mg total) by mouth 2 (two) times daily. Patsy Lenis, MD  Active   FYCOMPA  2 MG tablet [id number] Yes Take 1 tablet (2 mg total) by mouth at bedtime. Onita Duos, MD  Active Child, Pharmacy Records  lacosamide  (VIMPAT ) 200 MG TABS tablet [id number] Yes Take 1 tablet (200 mg total) by mouth 2 (two) times daily. Onita Duos, MD  Active Child, Pharmacy Records  lisinopril  (ZESTRIL ) 5 MG tablet [id number]  Take 5 mg by mouth every evening. [provider]  Active Child, Pharmacy Records  rosuvastatin  (  CRESTOR ) 10 MG tablet [id number] Yes Take 1 tablet (10 mg total) by mouth daily with supper. Maurice Sharlet RAMAN, PA-C  Active Child, Pharmacy Records  Semaglutide [facility]) 7 MG TABS [id number] Yes Take 7 mg by mouth daily before breakfast. Take one tablet (7mg ) by mouth daily 20 minutes prior to eating breakfast every morning.  [provider]  Active Child, Pharmacy Records           Med Note LEOBARDO, NICCI   Tue Aug 25, 2024 12:50 AM)    simethicone  (MYLICON) 125 MG chewable tablet [id number] Yes Chew 125 mg by mouth every 6 (six) hours as needed for flatulence. [provider]  Active Child, Pharmacy Records            Goals Addressed             This Visit's Progress    [facility] Transitions of Care (TOC) Care Plan       Problems:  Recent Hospitalization for treatment of Seizures Knowledge Deficit Related to Seizures  Goal:  Over the next 30 days, the patient will not experience hospital readmission  Interventions:  Transitions of Care: Doctor Visits  - discussed the importance of doctor visits Reiterated seizure and seizure precautions Advised to:  Take the medication exactly as prescribed Do not stop suddenly (risk of seizure rebound) Call provider before making dose changes Discuss with PCP any over the counter medications prior to taking  Patient Self Care Activities:  Attend all scheduled provider appointments Call pharmacy for medication refills 3-7 days in advance of running out of medications Call provider office for new concerns or questions  Notify RN Care Manager of TOC call rescheduling needs Participate in Transition of Care Program/Attend TOC scheduled calls Take medications as prescribed   Please monitor and report the following to physician:  Increased seizure frequency New or worsening symptoms Medication side effects Falls, injuries, confusion Missed doses High stress levels    Plan:  The patient has been provided with contact information for the care management team and has been advised to call with any health related questions or concerns.         Recommendation:   Continue Current Plan of Care  Follow Up Plan:   Telephone follow-up in 1 week  Drezden Seitzinger J. Alanny Rivers RN, MSN [facility], [facility] RN Care  Manager Direct Dial: [phone]  Fax: [phone] Website: [email or web address]      "

## 2024-09-17 NOTE — Patient Instructions (Signed)
 Visit Information  Thank you for taking time to visit with me today. Please don't hesitate to contact me if I can be of assistance to you before our next scheduled telephone appointment.  Our next appointment is by telephone on 09/23/24 at 1200 pm  Following is a copy of your care plan:   Goals Addressed             This Visit's Progress    [facility] Transitions of Care (TOC) Care Plan       Problems:  Recent Hospitalization for treatment of Seizures Knowledge Deficit Related to Seizures  Goal:  Over the next 30 days, the patient will not experience hospital readmission  Interventions:  Transitions of Care: Doctor Visits  - discussed the importance of doctor visits Reiterated seizure and seizure precautions Advised to:  Take the medication exactly as prescribed Do not stop suddenly (risk of seizure rebound) Call provider before making dose changes Discuss with PCP any over the counter medications prior to taking  Patient Self Care Activities:  Attend all scheduled provider appointments Call pharmacy for medication refills 3-7 days in advance of running out of medications Call provider office for new concerns or questions  Notify RN Care Manager of TOC call rescheduling needs Participate in Transition of Care Program/Attend TOC scheduled calls Take medications as prescribed   Please monitor and report the following to physician:  Increased seizure frequency New or worsening symptoms Medication side effects Falls, injuries, confusion Missed doses High stress levels    Plan:  The patient has been provided with contact information for the care management team and has been advised to call with any health related questions or concerns.         Patient verbalizes understanding of instructions and care plan provided today and agrees to view in MyChart. Active MyChart status and patient understanding of how to access instructions and care plan via MyChart confirmed with  patient.     The patient has been provided with contact information for the care management team and has been advised to call with any health related questions or concerns.   Please call the care guide team at [phone] if you need to cancel or reschedule your appointment.   Please call the Suicide and Crisis Lifeline: 988 if you are experiencing a Mental Health or Behavioral Health Crisis or need someone to talk to.  Taylan Marez J. Devlin Mcveigh RN, MSN [facility], [facility] RN Care Manager Direct Dial: [phone]  Fax: [phone] Website: [email or web address]

## 2024-09-23 ENCOUNTER — Telehealth

## 2024-12-24 ENCOUNTER — Ambulatory Visit: Admitting: Neurology
